# Patient Record
Sex: Male | Born: 1945
Health system: Southern US, Community
[De-identification: ages and names within clinical notes are randomized; demographics above are authoritative.]

## PROBLEM LIST (undated history)

## (undated) DIAGNOSIS — G4730 Sleep apnea, unspecified: Secondary | ICD-10-CM

## (undated) DIAGNOSIS — Z8679 Personal history of other diseases of the circulatory system: Secondary | ICD-10-CM

## (undated) DIAGNOSIS — R945 Abnormal results of liver function studies: Secondary | ICD-10-CM

## (undated) DIAGNOSIS — F32A Depression, unspecified: Secondary | ICD-10-CM

## (undated) DIAGNOSIS — E7800 Pure hypercholesterolemia, unspecified: Secondary | ICD-10-CM

## (undated) DIAGNOSIS — J310 Chronic rhinitis: Secondary | ICD-10-CM

## (undated) DIAGNOSIS — R7989 Other specified abnormal findings of blood chemistry: Secondary | ICD-10-CM

## (undated) DIAGNOSIS — F419 Anxiety disorder, unspecified: Secondary | ICD-10-CM

## (undated) DIAGNOSIS — K5790 Diverticulosis of intestine, part unspecified, without perforation or abscess without bleeding: Secondary | ICD-10-CM

## (undated) DIAGNOSIS — F329 Major depressive disorder, single episode, unspecified: Secondary | ICD-10-CM

## (undated) DIAGNOSIS — D689 Coagulation defect, unspecified: Secondary | ICD-10-CM

## (undated) DIAGNOSIS — C4491 Basal cell carcinoma of skin, unspecified: Secondary | ICD-10-CM

## (undated) HISTORY — PX: COLONOSCOPY: SHX174

## (undated) HISTORY — DX: Other specified abnormal findings of blood chemistry: R79.89

## (undated) HISTORY — DX: Coagulation defect, unspecified: D68.9

## (undated) HISTORY — DX: Chronic rhinitis: J31.0

## (undated) HISTORY — DX: Depression, unspecified: F32.A

## (undated) HISTORY — DX: Anxiety disorder, unspecified: F41.9

## (undated) HISTORY — DX: Abnormal results of liver function studies: R94.5

## (undated) HISTORY — DX: Basal cell carcinoma of skin, unspecified: C44.91

## (undated) HISTORY — DX: Diverticulosis of intestine, part unspecified, without perforation or abscess without bleeding: K57.90

## (undated) HISTORY — DX: Sleep apnea, unspecified: G47.30

## (undated) HISTORY — DX: Pure hypercholesterolemia, unspecified: E78.00

## (undated) HISTORY — DX: Personal history of other diseases of the circulatory system: Z86.79

## (undated) HISTORY — DX: Major depressive disorder, single episode, unspecified: F32.9

---

## 1998-10-16 ENCOUNTER — Emergency Department (HOSPITAL_COMMUNITY): Admission: EM | Admit: 1998-10-16 | Discharge: 1998-10-16 | Payer: Self-pay

## 2003-01-18 ENCOUNTER — Encounter: Payer: Self-pay | Admitting: Emergency Medicine

## 2003-01-18 ENCOUNTER — Emergency Department (HOSPITAL_COMMUNITY): Admission: EM | Admit: 2003-01-18 | Discharge: 2003-01-18 | Payer: Self-pay | Admitting: Emergency Medicine

## 2003-01-18 ENCOUNTER — Encounter: Payer: Self-pay | Admitting: Orthopedic Surgery

## 2003-01-24 ENCOUNTER — Inpatient Hospital Stay (HOSPITAL_COMMUNITY): Admission: RE | Admit: 2003-01-24 | Discharge: 2003-01-26 | Payer: Self-pay | Admitting: Orthopedic Surgery

## 2003-01-24 HISTORY — PX: WRIST SURGERY: SHX841

## 2009-06-24 ENCOUNTER — Encounter: Payer: Self-pay | Admitting: Internal Medicine

## 2009-07-09 ENCOUNTER — Ambulatory Visit: Payer: Self-pay | Admitting: Internal Medicine

## 2009-07-23 ENCOUNTER — Ambulatory Visit: Payer: Self-pay | Admitting: Internal Medicine

## 2011-04-30 NOTE — Op Note (Signed)
NAME:  Jonathan Bridges, Jonathan Bridges                             ACCOUNT NO.:  [id number]   MEDICAL RECORD NO.:  [id number]                   PATIENT TYPE:  OIB   LOCATION:  5006                                 FACILITY:  [facility]   PHYSICIAN:  Dionne Ano. Everlene Other, M.D.         DATE OF BIRTH:  1946-01-21   DATE OF PROCEDURE:  01/24/2003  DATE OF DISCHARGE:                                 OPERATIVE REPORT   PREOPERATIVE DIAGNOSIS:  Comminuted complex intra-articular right distal  radius fracture with bone loss.   POSTOPERATIVE DIAGNOSIS:  Comminuted complex intra-articular right distal  radius fracture with bone loss.   PROCEDURE:  1. Open reduction and internal fixation, right distal radius fracture with     DVR plate from [employer] and K-wire fixation.  2. Synthetic bone grafting from [facility] in the form of an MIG bone     graft placed dorsally through a separate incision.  3. Extensor pollicis longus tendon sheath decompression and incision.  4. Posterior interosseous nerve neurectomy.  5. Stress radiography.   SURGEON:  Dionne Ano. Amanda Pea, M.D.   ASSISTANT:  Karie Chimera, P.A.-C.   COMPLICATIONS:  None.   ANESTHESIA:  General.   TOURNIQUET TIME:  Less than 90 minutes.   ANESTHESIA:  General with preoperative axillary block placed.   DRAINS:  One.   ESTIMATED BLOOD LOSS:  Minimal.   INDICATIONS FOR PROCEDURE:  This patient is a 65 year old male with the  above mentioned diagnosis. I have counseled him with regards to the risks  and benefits of the surgery including the risks of infection, bleeding,  anesthesia, damage to normal structures and failure of surgery to accomplish  its intended goals of relieving symptoms and restoring function. The patient  does have sensation intact preoperatively. He has a slight bit of numbness  at the tip of his thumb, but nothing that has advanced or shows signs of an  evolving and aggressive carpal tunnel syndrome. He has underwent  provisional  reduction in the emergency room, however, his progressive interval collapse  and intraarticular displacement necessitates surgical intervention. He  understands all risks and benefits, including possible dystrophy and other  sequelae including malunion, nonunion, need for hardware removal, etc.   DESCRIPTION OF PROCEDURE:  The patient was seen by myself and anesthesia. He  was  taken to the operative suite. A preoperative axillary block was found  to be incomplete and he underwent a general anesthetic as well under the  direction of Dr. Laverle Hobby.  Following this he was laid supine and  appropriately padded and then prepped and draped in the usual sterile  fashion. Preoperative antibiotics were given.   Once a sterile field was secured the patient had finger taps placed about  the wrist. Fluoroscopy was brought in and the fracture site was identified.  I felt that this fracture would be amenable to plate fixation volarly with  the DVR plate  and thus made an approach about the volar radial aspect of the  wrist under 250 mm of tourniquet control.   The patient had this performed to my satisfaction without difficulty. I used  an FCR splitting approach. I retracted the FCR and the carpal canal contents  ulnarly. Following this the pronator quadratus was elevated from radial to  ulnar with an L-shaped incision. This accessed the fracture site.   I then placed the [facility] elevator and combination curet into the fracture  site and reduced the fracture to anatomic position. Following this I applied  a DVR plate. The patient had this applied in the standard AO technique. A  Kirchner wire was placed of the 0.062 variety off the radial styloid for  additional fixation, as the radial styloid piece was very large, and I felt  this would provide extra stability for the first 4 weeks.   This wire was placed and clipped below the skin. I placed all the screws in  the plate  appropriately and made sure that the screws did not encroach the  joint. I took multiple x-rays and performed the intraoperative stress  radiography to ensure this. The patient had excellent alignment, recreation  of his jointline to my satisfaction and good stability.   Once the plate was placed we then irrigated the wound copiously, made a  dorsal incision and performed an EPL tendon sheath incision. This was done  to my satisfaction without difficulty. This decompressed a large amount of  hematoma, and following this a dorsal V-defect was accessed and a synthetic  calcium sulfate bone graft from [facility] (MIG) was placed. This was  done by mixing the prepackaged mixture and then placing this under direct  fluoroscopy into the dorsal V-defect. This was placed to my satisfaction  without difficulty and the patient tolerated this well. There was no  extrusion into the soft tissues.   Following this I performed a posterior interosseous nerve neurectomy; 3 cm  was removed. I head cauterized and crushed the end and allowed it to retract  well proximally to prevent postoperative pain. The patient tolerated this  well without difficulty.   Once this was done the tourniquet was deflated. Hemostasis was obtained.  Copious irrigation was applied. The pronator quadratus was repaired with 3-0  Vicryl. The skin and subcu were repaired with 3-0 Vicryl and Prolene. A #7  PLS drain was placed to allow for egress of fluid. The patient had the  dorsal incision closed with simple Prolene suture.   All compartments were soft. He had excellent refill and a good pulse.  Following this he was  placed in a sterile dressing and a short arm splint.  We did perform stress videography at the conclusion of the case which  demonstrated excellent stability of the fracture site, and I do think he  will be a candidate for early range of motion.  It has been a pleasure to participate in  his care. We will  monitor him  closely. We will plan for an occupational therapy consult, elevation, IV  antibiotics, pain control according to his needs, etc. All questions have  been encouraged and answered.                                               Dionne Ano. Everlene Other, M.D.    [facility]  D:  01/24/2003  T:  01/24/2003  Job:  [id number]

## 2012-02-29 ENCOUNTER — Ambulatory Visit (INDEPENDENT_AMBULATORY_CARE_PROVIDER_SITE_OTHER): Payer: BC Managed Care – PPO | Admitting: Family Medicine

## 2012-02-29 VITALS — BP 130/75 | HR 86 | Temp 98.5°F | Resp 16 | Ht 71.0 in | Wt 214.8 lb

## 2012-02-29 DIAGNOSIS — J0100 Acute maxillary sinusitis, unspecified: Secondary | ICD-10-CM

## 2012-02-29 DIAGNOSIS — J329 Chronic sinusitis, unspecified: Secondary | ICD-10-CM

## 2012-02-29 DIAGNOSIS — J209 Acute bronchitis, unspecified: Secondary | ICD-10-CM

## 2012-02-29 DIAGNOSIS — E785 Hyperlipidemia, unspecified: Secondary | ICD-10-CM

## 2012-02-29 DIAGNOSIS — J40 Bronchitis, not specified as acute or chronic: Secondary | ICD-10-CM

## 2012-02-29 MED ORDER — HYDROCODONE-HOMATROPINE 5-1.5 MG/5ML PO SYRP: 5.0000 mL | ORAL_SOLUTION | Freq: Three times a day (TID) | ORAL | Status: AC | PRN

## 2012-02-29 MED ORDER — LEVOFLOXACIN 500 MG PO TABS: 500.0000 mg | ORAL_TABLET | Freq: Every day | ORAL | Status: AC

## 2012-02-29 NOTE — Patient Instructions (Signed)
Bronchitis  Bronchitis is the body's way of reacting to injury and/or infection (inflammation) of the bronchi. Bronchi are the air tubes that extend from the windpipe into the lungs. If the inflammation becomes severe, it may cause shortness of breath.  CAUSES   Inflammation may be caused by:   A virus.   Germs (bacteria).   Dust.   Allergens.   Pollutants and many other irritants.  The cells lining the bronchial tree are covered with tiny hairs (cilia). These constantly beat upward, away from the lungs, toward the mouth. This keeps the lungs free of pollutants. When these cells become too irritated and are unable to do their job, mucus begins to develop. This causes the characteristic cough of bronchitis. The cough clears the lungs when the cilia are unable to do their job. Without either of these protective mechanisms, the mucus would settle in the lungs. Then you would develop pneumonia.  Smoking is a common cause of bronchitis and can contribute to pneumonia. Stopping this habit is the single most important thing you can do to help yourself.  TREATMENT    Your caregiver may prescribe an antibiotic if the cough is caused by bacteria. Also, medicines that open up your airways make it easier to breathe. Your caregiver may also recommend or prescribe an expectorant. It will loosen the mucus to be coughed up. Only take over-the-counter or prescription medicines for pain, discomfort, or fever as directed by your caregiver.   Removing whatever causes the problem (smoking, for example) is critical to preventing the problem from getting worse.   Cough suppressants may be prescribed for relief of cough symptoms.   Inhaled medicines may be prescribed to help with symptoms now and to help prevent problems from returning.   For those with recurrent (chronic) bronchitis, there may be a need for steroid medicines.  SEEK IMMEDIATE MEDICAL CARE IF:    During treatment, you develop more pus-like mucus (purulent  sputum).   You have a fever.   Your baby is older than 3 months with a rectal temperature of 102 F (38.9 C) or higher.   Your baby is 3 months old or younger with a rectal temperature of 100.4 F (38 C) or higher.   You become progressively more ill.   You have increased difficulty breathing, wheezing, or shortness of breath.  It is necessary to seek immediate medical care if you are elderly or sick from any other disease.  MAKE SURE YOU:    Understand these instructions.   Will watch your condition.   Will get help right away if you are not doing well or get worse.  Document Released: 11/29/2005 Document Revised: 11/18/2011 Document Reviewed: 10/08/2008  Exit[employer] Patient Information 2012 [employer]  Sinusitis  Sinuses are air pockets within the bones of your face. The growth of bacteria within a sinus leads to infection. The infection prevents the sinuses from draining. This infection is called sinusitis.  SYMPTOMS   There will be different areas of pain depending on which sinuses have become infected.   The maxillary sinuses often produce pain beneath the eyes.   Frontal sinusitis may cause pain in the middle of the forehead and above the eyes.  Other problems (symptoms) include:   Toothaches.   Colored, pus-like (purulent) drainage from the nose.   Swelling, warmth, and tenderness over the sinus areas may be signs of infection.  TREATMENT   Sinusitis is most often determined by an exam.X-rays may be taken. If x-rays have   been taken, make sure you obtain your results or find out how you are to obtain them. Your caregiver may give you medications (antibiotics). These are medications that will help kill the bacteria causing the infection. You may also be given a medication (decongestant) that helps to reduce sinus swelling.   HOME CARE INSTRUCTIONS    Only take over-the-counter or prescription medicines for pain, discomfort, or fever as directed by your caregiver.   Drink extra fluids. Fluids  help thin the mucus so your sinuses can drain more easily.   Applying either moist heat or ice packs to the sinus areas may help relieve discomfort.   Use saline nasal sprays to help moisten your sinuses. The sprays can be found at your local drugstore.  SEEK IMMEDIATE MEDICAL CARE IF:   You have a fever.   You have increasing pain, severe headaches, or toothache.   You have nausea, vomiting, or drowsiness.   You develop unusual swelling around the face or trouble seeing.  MAKE SURE YOU:    Understand these instructions.   Will watch your condition.   Will get help right away if you are not doing well or get worse.  Document Released: 11/29/2005 Document Revised: 11/18/2011 Document Reviewed: 06/28/2007  [employer] Patient Information 2012 [employer].

## 2012-02-29 NOTE — Progress Notes (Signed)
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  Cough is only occasionally productive.  Fevers have resolved.  No epistaxis.  O:  NAD TM's normal Nose mucopurulent discharge Oroph:  Clear Chest:  Few ronchi.

## 2012-08-09 ENCOUNTER — Ambulatory Visit (INDEPENDENT_AMBULATORY_CARE_PROVIDER_SITE_OTHER): Payer: BC Managed Care – PPO | Admitting: Family Medicine

## 2012-08-09 ENCOUNTER — Encounter: Payer: Self-pay | Admitting: Family Medicine

## 2012-08-09 VITALS — BP 137/81 | HR 77 | Temp 97.9°F | Resp 16 | Ht 70.75 in | Wt 212.0 lb

## 2012-08-09 DIAGNOSIS — E7800 Pure hypercholesterolemia, unspecified: Secondary | ICD-10-CM

## 2012-08-09 DIAGNOSIS — N529 Male erectile dysfunction, unspecified: Secondary | ICD-10-CM

## 2012-08-09 DIAGNOSIS — F419 Anxiety disorder, unspecified: Secondary | ICD-10-CM | POA: Insufficient documentation

## 2012-08-09 DIAGNOSIS — J310 Chronic rhinitis: Secondary | ICD-10-CM | POA: Insufficient documentation

## 2012-08-09 DIAGNOSIS — Z0000 Encounter for general adult medical examination without abnormal findings: Secondary | ICD-10-CM

## 2012-08-09 DIAGNOSIS — F329 Major depressive disorder, single episode, unspecified: Secondary | ICD-10-CM | POA: Insufficient documentation

## 2012-08-09 DIAGNOSIS — G4730 Sleep apnea, unspecified: Secondary | ICD-10-CM | POA: Insufficient documentation

## 2012-08-09 DIAGNOSIS — F32A Depression, unspecified: Secondary | ICD-10-CM | POA: Insufficient documentation

## 2012-08-09 LAB — COMPREHENSIVE METABOLIC PANEL
ALT: 29 U/L (ref 0–53)
AST: 32 U/L (ref 0–37)
CO2: 29 mEq/L (ref 19–32)
Calcium: 9.8 mg/dL (ref 8.4–10.5)
Chloride: 103 mEq/L (ref 96–112)
Potassium: 4.6 mEq/L (ref 3.5–5.3)
Sodium: 141 mEq/L (ref 135–145)
Total Protein: 7.3 g/dL (ref 6.0–8.3)

## 2012-08-09 LAB — PSA: PSA: 0.6 ng/mL (ref <=4.00)

## 2012-08-09 LAB — IFOBT (OCCULT BLOOD): IFOBT: NEGATIVE

## 2012-08-09 LAB — LIPID PANEL: Cholesterol: 174 mg/dL (ref 0–200)

## 2012-08-09 MED ORDER — TADALAFIL 5 MG PO TABS: ORAL_TABLET | ORAL | Status: DC

## 2012-08-09 MED ORDER — SIMVASTATIN 40 MG PO TABS: 40.0000 mg | ORAL_TABLET | Freq: Every evening | ORAL | Status: DC

## 2012-08-09 NOTE — Progress Notes (Signed)
Subjective:    Patient ID: Jonathan Bridges, male    DOB: June 08, 1946, 66 y.o.   MRN: [id number]  HPI  This 66 y.o. Cauc male is here for CPE. He enjoys good health and he takes Simvastatin for  elevated cholesterol.He has a hx of HTN but has been off meds since 2009. He was diagnosed with  Basal Cell Carcinoma in 2011. He is a nonsmoker and nondrinker; he exercises 2x/ week for 45  minutes. 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 He does have some mild E.D., onset within last 6 months. He would like to  "have a little help" with maintaining a firm erection.   Colonoscopy:8/11/ 2010 (normal)- Dr. Yancey Flemings      Review of Systems  Psychiatric/Behavioral:       Pt has hx of Depression/ anxiety treated several years ago; pt is now off prescription medications     Otherwise noncontributory.     Objective:   Physical Exam  Nursing note and vitals reviewed. Constitutional: He is oriented to person, place, and time. He appears well-developed and well-nourished. No distress.  HENT:  Head: Normocephalic and atraumatic.  Right Ear: Hearing, tympanic membrane, external ear and ear canal normal.  Left Ear: Hearing, tympanic membrane, external ear and ear canal normal.  Nose: Nose normal. No nasal deformity or septal deviation.  Mouth/Throat: Uvula is midline, oropharynx is clear and moist and mucous membranes are normal. No oral lesions. No dental caries.  Eyes: Conjunctivae and EOM are normal. Pupils are equal, round, and reactive to light. No scleral icterus.       Wears corrective lenses  Neck: Normal range of motion. Neck supple. No thyromegaly present.  Cardiovascular: Normal rate, regular rhythm, normal heart sounds and intact distal pulses.  Exam reveals no gallop and no friction rub.   No murmur heard. Pulmonary/Chest: Effort normal and breath sounds normal. No respiratory distress. He has no wheezes. He  exhibits no tenderness.  Abdominal: Soft. Bowel sounds are normal. He exhibits no mass. There is no hepatosplenomegaly. There is no tenderness. There is no guarding and no CVA tenderness. No hernia.  Genitourinary: Rectum normal, prostate normal, testes normal and penis normal. Rectal exam shows no external hemorrhoid, no fissure, no mass, no tenderness and anal tone normal. Guaiac negative stool. Right testis shows no mass, no swelling and no tenderness. Left testis shows no mass, no swelling and no tenderness.  Musculoskeletal: Normal range of motion. He exhibits no edema and no tenderness.  Lymphadenopathy:    He has no cervical adenopathy.       Right: No inguinal adenopathy present.       Left: No inguinal adenopathy present.  Neurological: He is alert and oriented to person, place, and time. He has normal reflexes. No cranial nerve deficit. He exhibits normal muscle tone. Coordination normal.  Skin: Skin is warm and dry. No rash noted. No erythema. No pallor.  Psychiatric: He has a normal mood and affect. His behavior is normal. Judgment and thought content normal.    ECG: NSR; no ectopy or ST-TW changes    Results for orders placed in visit on 08/09/12  COMPREHENSIVE METABOLIC PANEL      Component Value Range   Sodium 141  135 - 145 mEq/L   Potassium 4.6  3.5 - 5.3 mEq/L   Chloride 103  96 - 112 mEq/L  CO2 29  19 - 32 mEq/L   Glucose, Bld 92  70 - 99 mg/dL   BUN 12  6 - 23 mg/dL   Creat 4.54  0.98 - 1.19 mg/dL   Total Bilirubin 0.7  0.3 - 1.2 mg/dL   Alkaline Phosphatase 71  39 - 117 U/L   AST 32  0 - 37 U/L   ALT 29  0 - 53 U/L   Total Protein 7.3  6.0 - 8.3 g/dL   Albumin 4.7  3.5 - 5.2 g/dL   Calcium 9.8  8.4 - 14.7 mg/dL  LIPID PANEL      Component Value Range   Cholesterol 174  0 - 200 mg/dL   Triglycerides 829  <562 mg/dL   HDL 46  >13 mg/dL   Total CHOL/HDL Ratio 3.8     VLDL 21  0 - 40 mg/dL   LDL Cholesterol 086 (*) 0 - 99 mg/dL  PSA      Component Value  Range   PSA 0.60  <=4.00 ng/mL  IFOBT (OCCULT BLOOD)      Component Value Range   IFOBT Negative           Assessment & Plan:   1. Routine general medical examination at a health care facility  EKG 12-Lead, Comprehensive metabolic panel, PSA, IFOBT POC (occult bld, rslt in office)  2. Hypercholesterolemia  simvastatin (ZOCOR) 40 MG tablet, Comprehensive metabolic panel, Lipid panel  3. ED (erectile dysfunction)  RX: Cialis generic 5 mg  1 tablet daily Contact clinic with problems or questions If Cialis 5 mg dose not adequate (and no adverse effects), then try 2- 5 mg tablets= 10mg  1-4 hours prior to intercourse (emphasized that medication is not to betaken more than once daily)

## 2012-08-09 NOTE — Progress Notes (Signed)
Quick Note:  Please notify pt that results are normal.   Provide pt with copy of labs. ______ 

## 2012-08-09 NOTE — Patient Instructions (Addendum)

## 2012-08-10 ENCOUNTER — Telehealth: Payer: Self-pay

## 2012-08-10 NOTE — Telephone Encounter (Signed)
Completed prior auth over the phone for pt's Cialis Rx. It was denied - his ins does not cover this for his Dx of ED. Notified pt and advised him he can get a coupon for free 30 day trial and also suggested he should call around for best price at pharmacies. Pt thanked [place].

## 2012-08-30 ENCOUNTER — Telehealth: Payer: Self-pay

## 2012-08-30 NOTE — Telephone Encounter (Signed)
Received prior auth request for pt's cialis. Sent back form w/note that the prior auth was denied and pt aware. Pt has coupon for free 30 day trial.

## 2012-09-27 ENCOUNTER — Other Ambulatory Visit: Payer: Self-pay | Admitting: Radiology

## 2012-09-27 DIAGNOSIS — E7800 Pure hypercholesterolemia, unspecified: Secondary | ICD-10-CM

## 2012-09-27 MED ORDER — SIMVASTATIN 40 MG PO TABS: 40.0000 mg | ORAL_TABLET | Freq: Every evening | ORAL | Status: DC

## 2013-03-11 ENCOUNTER — Ambulatory Visit: Payer: BC Managed Care – PPO | Admitting: Physician Assistant

## 2013-03-11 VITALS — BP 160/90 | HR 84 | Temp 98.0°F | Resp 16 | Ht 70.5 in | Wt 220.0 lb

## 2013-03-11 DIAGNOSIS — H109 Unspecified conjunctivitis: Secondary | ICD-10-CM

## 2013-03-11 DIAGNOSIS — H1045 Other chronic allergic conjunctivitis: Secondary | ICD-10-CM

## 2013-03-11 DIAGNOSIS — H1013 Acute atopic conjunctivitis, bilateral: Secondary | ICD-10-CM

## 2013-03-11 MED ORDER — POLYMYXIN B-TRIMETHOPRIM 10000-0.1 UNIT/ML-% OP SOLN: 1.0000 [drp] | OPHTHALMIC | Status: DC

## 2013-03-11 MED ORDER — AZELASTINE HCL 0.05 % OP SOLN: 1.0000 [drp] | Freq: Two times a day (BID) | OPHTHALMIC | Status: DC

## 2013-03-11 NOTE — Progress Notes (Signed)
  Subjective:    Patient ID: Jonathan Bridges, male    DOB: 1946-08-24, 67 y.o.   MRN: [id number]  HPI 67 year old male presents with 2 week history of bilateral eye drainage that has been clear and watery with some crusting.  Does admit that he has a history of eye irritation that is chronic in nature. No history of environmental allergies. Admits eye drainage has worsened with recent "cold" that does seem to be improving. Has had nasal congestion and rhinorrhea. No fevers, chills, vision changes, headache, otalgia, dizziness, cough, nausea, or vomiting. He otherwise feels well and has no other concerns today.  He is not a contact wearer - has reading and driving glasses only.     Review of Systems  Constitutional: Negative for fever and chills.  HENT: Positive for congestion and rhinorrhea. Negative for ear pain, sore throat and postnasal drip.   Eyes: Positive for discharge, redness and itching. Negative for photophobia, pain and visual disturbance.  Respiratory: Negative for cough and shortness of breath.   Gastrointestinal: Negative for nausea, vomiting and abdominal pain.  Neurological: Negative for dizziness and headaches.       Objective:   Physical Exam  Constitutional: He is oriented to person, place, and time. He appears well-developed and well-nourished.  HENT:  Head: Normocephalic and atraumatic.  Right Ear: Hearing, tympanic membrane, external ear and ear canal normal.  Left Ear: Hearing, tympanic membrane, external ear and ear canal normal.  Mouth/Throat: Uvula is midline, oropharynx is clear and moist and mucous membranes are normal. No oropharyngeal exudate.  Eyes: EOM are normal. Pupils are equal, round, and reactive to light. Right eye exhibits discharge. Right eye exhibits no hordeolum. Left eye exhibits discharge. Left eye exhibits no hordeolum. Right conjunctiva is injected. Left conjunctiva is injected.  Neck: Normal range of motion.  Cardiovascular: Normal rate, regular  rhythm and normal heart sounds.   Pulmonary/Chest: Effort normal and breath sounds normal.  Lymphadenopathy:    He has no cervical adenopathy.  Neurological: He is alert and oriented to person, place, and time.  Psychiatric: He has a normal mood and affect. His behavior is normal. Judgment and thought content normal.          Assessment & Plan:  Conjunctivitis - Plan: trimethoprim-polymyxin b (POLYTRIM) ophthalmic solution  Allergic conjunctivitis, bilateral - Plan: azelastine (OPTIVAR) 0.05 % ophthalmic solution  Polytrim q4hours x 5 days If symptoms persist, trial of Optivar twice daily RTC if symptoms worsen. If no improvement after treatment, may need to follow up with ophthalmology

## 2013-09-19 ENCOUNTER — Other Ambulatory Visit: Payer: Self-pay | Admitting: Family Medicine

## 2013-09-19 ENCOUNTER — Encounter: Payer: Self-pay | Admitting: Family Medicine

## 2013-09-19 ENCOUNTER — Ambulatory Visit (INDEPENDENT_AMBULATORY_CARE_PROVIDER_SITE_OTHER): Payer: Medicare Other | Admitting: Family Medicine

## 2013-09-19 VITALS — BP 141/76 | HR 82 | Temp 98.2°F | Resp 16 | Ht 71.0 in | Wt 215.0 lb

## 2013-09-19 DIAGNOSIS — E7800 Pure hypercholesterolemia, unspecified: Secondary | ICD-10-CM

## 2013-09-19 DIAGNOSIS — Z23 Encounter for immunization: Secondary | ICD-10-CM

## 2013-09-19 DIAGNOSIS — Z139 Encounter for screening, unspecified: Secondary | ICD-10-CM

## 2013-09-19 DIAGNOSIS — N529 Male erectile dysfunction, unspecified: Secondary | ICD-10-CM

## 2013-09-19 DIAGNOSIS — Z0000 Encounter for general adult medical examination without abnormal findings: Secondary | ICD-10-CM

## 2013-09-19 DIAGNOSIS — R600 Localized edema: Secondary | ICD-10-CM

## 2013-09-19 DIAGNOSIS — R609 Edema, unspecified: Secondary | ICD-10-CM

## 2013-09-19 LAB — CBC WITH DIFFERENTIAL/PLATELET
Basophils Absolute: 0 10*3/uL (ref 0.0–0.1)
Basophils Relative: 0 % (ref 0–1)
Eosinophils Absolute: 0 10*3/uL (ref 0.0–0.7)
Eosinophils Relative: 1 % (ref 0–5)
HCT: 45.7 % (ref 39.0–52.0)
Hemoglobin: 16.2 g/dL (ref 13.0–17.0)
MCH: 32 pg (ref 26.0–34.0)
MCHC: 35.4 g/dL (ref 30.0–36.0)
MCV: 90.3 fL (ref 78.0–100.0)
Monocytes Absolute: 0.4 10*3/uL (ref 0.1–1.0)
Monocytes Relative: 7 % (ref 3–12)

## 2013-09-19 LAB — COMPREHENSIVE METABOLIC PANEL
ALT: 35 U/L (ref 0–53)
CO2: 32 mEq/L (ref 19–32)
Chloride: 101 mEq/L (ref 96–112)
Creat: 1.22 mg/dL (ref 0.50–1.35)
Glucose, Bld: 102 mg/dL — ABNORMAL HIGH (ref 70–99)
Total Bilirubin: 0.6 mg/dL (ref 0.3–1.2)

## 2013-09-19 LAB — POCT URINALYSIS DIPSTICK
Bilirubin, UA: NEGATIVE
Glucose, UA: NEGATIVE
Protein, UA: NEGATIVE
Spec Grav, UA: 1.02
Urobilinogen, UA: 0.2

## 2013-09-19 LAB — LIPID PANEL
Cholesterol: 204 mg/dL — ABNORMAL HIGH (ref 0–200)
HDL: 48 mg/dL (ref >39)
Total CHOL/HDL Ratio: 4.3 Ratio
Triglycerides: 101 mg/dL (ref <150)
VLDL: 20 mg/dL (ref 0–40)

## 2013-09-19 LAB — IFOBT (OCCULT BLOOD): IFOBT: NEGATIVE

## 2013-09-19 MED ORDER — TADALAFIL 5 MG PO TABS: ORAL_TABLET | ORAL | Status: DC

## 2013-09-19 NOTE — Patient Instructions (Signed)

## 2013-09-19 NOTE — Progress Notes (Signed)
Subjective:    Patient ID: Jonathan Bridges, male    DOB: 1946-08-09, 67 y.o.   MRN: [id number]  HPI  This 67 y.o. Cauc male is here today for Welcome to [facility] physical. He feels well but his wife is concerned about intermittent ankle edema. This occurs usually after prolonged sitting or w/ traveling and change in diet (lots of eating out at restaurants). Pt has concerns about ED medication; he had back pain and flushing w/ Cialis, which he was taking daily. He gets an erection but unable to maintain it.  HCM: Vision- annually.           CRS- 2011 (normal).           IMM- current.   Patient Active Problem List   Diagnosis Date Noted  . ED (erectile dysfunction) 08/09/2012  . Sleep apnea   . Hypercholesterolemia   . Depression   . Anxiety   . Chronic rhinitis   . Hyperlipidemia 02/29/2012   PMHx, Soc Hx and Fam Hx reviewed.   Review of Systems  Constitutional:       Able to perform ADLs; has no hearing impairment.  Genitourinary:       Erectile dysfunction.  Neurological: Positive for dizziness. Negative for syncope, speech difficulty, weakness, numbness and headaches.       Infrequent dizziness upon standing to fast.  Psychiatric/Behavioral:       HANDS Questionnaire for Depression Screening= 0  All other systems reviewed and are negative.       Objective:   Physical Exam  Nursing note and vitals reviewed. Constitutional: He is oriented to person, place, and time. Vital signs are normal. He appears well-developed and well-nourished. No distress.  Eyes: Conjunctivae, EOM and lids are normal. Pupils are equal, round, and reactive to light. No scleral icterus.  Annual eye care w/ specialist.  Neck: Normal range of motion and full passive range of motion without pain. Neck supple. No JVD present. No spinous process tenderness and no muscular tenderness present. Carotid bruit is not present. No mass and no thyromegaly present.  Cardiovascular: Normal rate, regular rhythm, S1  normal, S2 normal, normal heart sounds, intact distal pulses and normal pulses.   No extrasystoles are present. PMI is not displaced.  Exam reveals no gallop and no friction rub.   No murmur heard. Pulmonary/Chest: Effort normal and breath sounds normal. No respiratory distress.  Abdominal: Soft. Normal appearance and bowel sounds are normal. He exhibits no distension, no abdominal bruit, no pulsatile midline mass and no mass. There is no hepatosplenomegaly. There is no tenderness. There is no guarding and no CVA tenderness. No hernia.  Genitourinary: Rectum normal. Rectal exam shows no external hemorrhoid, no fissure, no mass, no tenderness and anal tone normal. Guaiac negative stool. Prostate is not enlarged and not tender.  Musculoskeletal: Normal range of motion. He exhibits no edema and no tenderness.  Lymphadenopathy:       Head (right side): No submental, no submandibular, no tonsillar, no posterior auricular and no occipital adenopathy present.       Head (left side): No submental, no submandibular, no tonsillar, no posterior auricular and no occipital adenopathy present.    He has no cervical adenopathy.       Right: No inguinal and no supraclavicular adenopathy present.       Left: No inguinal and no supraclavicular adenopathy present.  Neurological: He is alert and oriented to person, place, and time. He has normal strength. He is not disoriented.  He displays no atrophy. No cranial nerve deficit or sensory deficit. He exhibits normal muscle tone. Coordination and gait normal.  Reflex Scores:      Tricep reflexes are 1+ on the right side and 1+ on the left side.      Bicep reflexes are 1+ on the right side and 1+ on the left side.      Patellar reflexes are 2+ on the right side and 2+ on the left side. Skin: Skin is warm, dry and intact. No ecchymosis, no lesion and no rash noted. He is not diaphoretic. No cyanosis or erythema. No pallor. Nails show no clubbing.  Psychiatric: He has a  normal mood and affect. His speech is normal and behavior is normal. Judgment and thought content normal. His mood appears not anxious. His affect is not inappropriate. Cognition and memory are normal. He does not exhibit a depressed mood.     Results for orders placed in visit on 09/19/13  POCT URINALYSIS DIPSTICK      Result Value Range   Color, UA yellow     Clarity, UA clear     Glucose, UA neg     Bilirubin, UA neg     Ketones, UA neg     Spec Grav, UA 1.020     Blood, UA trace     pH, UA 6.5     Protein, UA neg     Urobilinogen, UA 0.2     Nitrite, UA neg     Leukocytes, UA Negative    IFOBT (OCCULT BLOOD)      Result Value Range   IFOBT Negative         Assessment & Plan:  Routine general medical examination at a health care facility - Plan: POCT urinalysis dipstick, IFOBT POC (occult bld, rslt in office), Comprehensive metabolic panel, Lipid panel, PSA, Medicare, TSH, T3, free, CBC with Differential  ED (erectile dysfunction)- Pt prefers to stay w/ Cialis but will use it only prn. Offered switch to Levitra; he will contact clinic if her has ongoing problems w/ Cialis.  Pure hypercholesterolemia - Plan: IFOBT POC (occult bld, rslt in office), Comprehensive metabolic panel, Lipid panel, PSA, Medicare, TSH, T3, free  Lower extremity edema - Suspect dependent edema w/ mild venous insufficiency. Suggest compression hose (knee high).     Plan: IFOBT POC (occult bld, rslt in office), Comprehensive metabolic panel, Lipid panel, PSA, Medicare, TSH, T3, free  Need for prophylactic vaccination and inoculation against influenza - Plan: Flu Vaccine QUAD 36+ mos IM  Meds ordered this encounter  Medications  . tadalafil (CIALIS) 5 MG tablet    Sig: Take 1 tablet about 1 hour before activity.    Dispense:  10 tablet    Refill:  3

## 2013-09-20 ENCOUNTER — Other Ambulatory Visit: Payer: Self-pay | Admitting: Family Medicine

## 2013-09-20 NOTE — Progress Notes (Signed)
Quick Note:  Please advise pt regarding following labs... All your labs look great except Total and LDL ("bad") cholesterol are slightly elevated. Your HDL ("good") cholesterol needs to be higher. Based on your lipid profile, you have an average risk of heart disease. These numbers are increased compared to last year. Continue taking current dose of Simvastatin 40 mg daily. The Mediterranean Diet is a great guide for healthier eating to lower total and LDL cholesterol; staying physically active helps raise the HDL ("good) cholesterol. Also consider Fish Oil capsule (Schiff MegaRed Krill Oil is potent); take 1 capsule daily, separate from other solid pills and capsules. These numbers can be rechecked in 6 months or so.  Prostate blood test is normal.  Copy to pt. ______

## 2014-05-01 ENCOUNTER — Other Ambulatory Visit: Payer: Self-pay | Admitting: Physician Assistant

## 2014-05-01 ENCOUNTER — Other Ambulatory Visit: Payer: Self-pay | Admitting: Emergency Medicine

## 2014-05-01 NOTE — Telephone Encounter (Signed)
Left message on VM advising patient that refill sent, and that he is due for re-evaluation and fasting labs for additional refills.

## 2014-08-24 ENCOUNTER — Other Ambulatory Visit: Payer: Self-pay | Admitting: Physician Assistant

## 2014-08-30 ENCOUNTER — Encounter: Payer: Medicare Other | Admitting: Family Medicine

## 2014-09-20 ENCOUNTER — Ambulatory Visit (INDEPENDENT_AMBULATORY_CARE_PROVIDER_SITE_OTHER): Payer: Medicare Other | Admitting: Family Medicine

## 2014-09-20 ENCOUNTER — Encounter: Payer: Self-pay | Admitting: Family Medicine

## 2014-09-20 VITALS — BP 140/82 | HR 77 | Temp 97.9°F | Resp 16 | Ht 70.5 in | Wt 219.4 lb

## 2014-09-20 DIAGNOSIS — Z23 Encounter for immunization: Secondary | ICD-10-CM

## 2014-09-20 DIAGNOSIS — M25511 Pain in right shoulder: Secondary | ICD-10-CM | POA: Diagnosis not present

## 2014-09-20 DIAGNOSIS — E7800 Pure hypercholesterolemia, unspecified: Secondary | ICD-10-CM

## 2014-09-20 DIAGNOSIS — Z1211 Encounter for screening for malignant neoplasm of colon: Secondary | ICD-10-CM | POA: Diagnosis not present

## 2014-09-20 DIAGNOSIS — Z0000 Encounter for general adult medical examination without abnormal findings: Secondary | ICD-10-CM

## 2014-09-20 LAB — LIPID PANEL
CHOL/HDL RATIO: 3.4 ratio
Cholesterol: 162 mg/dL (ref 0–200)
HDL: 48 mg/dL (ref >39)
LDL CALC: 93 mg/dL (ref 0–99)
Triglycerides: 105 mg/dL (ref <150)
VLDL: 21 mg/dL (ref 0–40)

## 2014-09-20 LAB — COMPLETE METABOLIC PANEL WITH GFR
ALT: 27 U/L (ref 0–53)
AST: 30 U/L (ref 0–37)
Albumin: 4.3 g/dL (ref 3.5–5.2)
Alkaline Phosphatase: 74 U/L (ref 39–117)
BUN: 15 mg/dL (ref 6–23)
CO2: 26 mEq/L (ref 19–32)
Calcium: 9.6 mg/dL (ref 8.4–10.5)
Chloride: 102 mEq/L (ref 96–112)
Creat: 1.28 mg/dL (ref 0.50–1.35)
GFR, EST NON AFRICAN AMERICAN: 58 mL/min — AB
GFR, Est African American: 67 mL/min
GLUCOSE: 88 mg/dL (ref 70–99)
Potassium: 4.6 mEq/L (ref 3.5–5.3)
Sodium: 139 mEq/L (ref 135–145)
TOTAL PROTEIN: 7.1 g/dL (ref 6.0–8.3)
Total Bilirubin: 0.7 mg/dL (ref 0.2–1.2)

## 2014-09-20 MED ORDER — SIMVASTATIN 40 MG PO TABS: 40.0000 mg | ORAL_TABLET | Freq: Every day | ORAL | Status: DC

## 2014-09-20 NOTE — Patient Instructions (Signed)
Keeping you healthy  Get these tests  Blood pressure- Have your blood pressure checked once a year by your healthcare provider.  Normal blood pressure is 120/80  Weight- Have your body mass index (BMI) calculated to screen for obesity.  BMI is a measure of body fat based on height and weight. You can also calculate your own BMI at [email or web address].  Cholesterol- Have your cholesterol checked every year.  Diabetes- Have your blood sugar checked regularly if you have high blood pressure, high cholesterol, have a family history of diabetes or if you are overweight.  Screening for Colon Cancer- Colonoscopy starting at age 68.  Screening may begin sooner depending on your family history and other health conditions. Follow up colonoscopy as directed by your Gastroenterologist.  Screening for Prostate Cancer- Both blood work (PSA) and a rectal exam help screen for Prostate Cancer.  Screening begins at age 76 with African-American men and at age 58 with Caucasian men.  Screening may begin sooner depending on your family history.  Take these medicines  Aspirin- One aspirin daily can help prevent Heart disease and Stroke.  Flu shot- Every fall.  Tetanus- Every 10 years.  Zostavax- Once after the age of 92 to prevent Shingles.  Pneumonia shot- Once after the age of 62; if you are younger than 50, ask your healthcare provider if you need a Pneumonia shot.  Take these steps  Don't smoke- If you do smoke, talk to your doctor about quitting.  For tips on how to quit, go to www.smokefree.gov or call 1-800-QUIT-NOW.  Be physically active- Exercise 5 days a week for at least 30 minutes.  If you are not already physically active start slow and gradually work up to 30 minutes of moderate physical activity.  Examples of moderate activity include walking briskly, mowing the yard, dancing, swimming, bicycling, etc.  Eat a healthy diet- Eat a variety of healthy food such as fruits, vegetables, low  fat milk, low fat cheese, yogurt, lean meant, poultry, fish, beans, tofu, etc. For more information go to www.thenutritionsource.org  Drink alcohol in moderation- Limit alcohol intake to less than two drinks a day. Never drink and drive.  Dentist- Brush and floss twice daily; visit your dentist twice a year.  Depression- Your emotional health is as important as your physical health. If you're feeling down, or losing interest in things you would normally enjoy please talk to your healthcare provider.  Eye exam- Visit your eye doctor every year.  Safe sex- If you may be exposed to a sexually transmitted infection, use a condom.  Seat belts- Seat belts can save your life; always wear one.  Smoke/Carbon Monoxide detectors- These detectors need to be installed on the appropriate level of your home.  Replace batteries at least once a year.  Skin cancer- When out in the sun, cover up and use sunscreen 15 SPF or higher.  Violence- If anyone is threatening you, please tell your healthcare provider.  Living Will/ Health care power of attorney- Speak with your healthcare provider and family.   Shoulder Pain The shoulder is the joint that connects your arm to your body. Muscles and band-like tissues that connect bones to muscles (tendons) hold the joint together. Shoulder pain is felt if an injury or medical problem affects one or more parts of the shoulder. HOME CARE   Put ice on the sore area.  Put ice in a plastic bag.  Place a towel between your skin and the bag.  Leave the ice  on for 15-20 minutes, 03-04 times a day for the first 2 days.  Stop using cold packs if they do not help with the pain.  If you were given something to keep your shoulder from moving (sling; shoulder immobilizer), wear it as told. Only take it off to shower or bathe.  Move your arm as little as possible, but keep your hand moving to prevent puffiness (swelling).  Squeeze a soft ball or foam pad as much as  possible to help prevent swelling.  Take medicine as told by your doctor. GET HELP IF:  You have progressing new pain in your arm, hand, or fingers.  Your hand or fingers get cold.  Your medicine does not help lessen your pain. GET HELP RIGHT AWAY IF:   Your arm, hand, or fingers are numb or tingling.  Your arm, hand, or fingers are puffy (swollen), painful, or turn white or blue. MAKE SURE YOU:   Understand these instructions.  Will watch your condition.  Will get help right away if you are not doing well or get worse. Document Released: 05/17/2008 Document Revised: 04/15/2014 Document Reviewed: 06/12/2012 [facility] Patient Information 2015 [place], [place]. This information is not intended to replace advice given to you by your health care provider. Make sure you discuss any questions you have with your health care provider.        Mediterranean Diet  Why follow it? Research shows.   Those who follow the Mediterranean diet have a reduced risk of heart disease    The diet is associated with a reduced incidence of Parkinson's and Alzheimer's diseases   People following the diet may have longer life expectancies and lower rates of chronic diseases    The Dietary Guidelines for Americans recommends the Mediterranean diet as an eating plan to promote health and prevent disease  What Is the Mediterranean Diet?    Healthy eating plan based on typical foods and recipes of Mediterranean-style cooking   The diet is primarily a plant based diet; these foods should make up a majority of meals   Starches - Plant based foods should make up a majority of meals - They are an important sources of vitamins, minerals, energy, antioxidants, and fiber - Choose whole grains, foods high in fiber and minimally processed items  - Typical grain sources include wheat, oats, barley, corn, brown rice, bulgar, farro, millet, polenta, couscous  - Various types of beans include chickpeas, lentils, fava  beans, black beans, white beans   Fruits  Veggies - Large quantities of antioxidant rich fruits & veggies; 6 or more servings  - Vegetables can be eaten raw or lightly drizzled with oil and cooked  - Vegetables common to the traditional Mediterranean Diet include: artichokes, arugula, beets, broccoli, brussel sprouts, cabbage, carrots, celery, collard greens, cucumbers, eggplant, kale, leeks, lemons, lettuce, mushrooms, okra, onions, peas, peppers, potatoes, pumpkin, radishes, rutabaga, shallots, spinach, sweet potatoes, turnips, zucchini - Fruits common to the Mediterranean Diet include: apples, apricots, avocados, cherries, clementines, dates, figs, grapefruits, grapes, melons, nectarines, oranges, peaches, pears, pomegranates, strawberries, tangerines  Fats - Replace butter and margarine with healthy oils, such as olive oil, canola oil, and tahini  - Limit nuts to no more than a handful a day  - Nuts include walnuts, almonds, pecans, pistachios, pine nuts  - Limit or avoid candied, honey roasted or heavily salted nuts - Olives are central to the Mediterranean diet - can be eaten whole or used in a variety of dishes   Meats  Protein - Limiting red meat: no more than a few times a month - When eating red meat: choose lean cuts and keep the portion to the size of deck of cards - Eggs: approx. 0 to 4 times a week  - Fish and lean poultry: at least 2 a week  - Healthy protein sources include, chicken, [place], lean beef, lamb - Increase intake of seafood such as tuna, salmon, trout, mackerel, shrimp, scallops - Avoid or limit high fat processed meats such as sausage and bacon  Dairy - Include moderate amounts of low fat dairy products  - Focus on healthy dairy such as fat free yogurt, skim milk, low or reduced fat cheese - Limit dairy products higher in fat such as whole or 2% milk, cheese, ice cream  Alcohol - Moderate amounts of red wine is ok  - No more than 5 oz daily for women (all ages) and  men older than age 48  - No more than 10 oz of wine daily for men younger than 90  Other - Limit sweets and other desserts  - Use herbs and spices instead of salt to flavor foods  - Herbs and spices common to the traditional Mediterranean Diet include: basil, bay leaves, chives, cloves, cumin, fennel, garlic, lavender, marjoram, mint, oregano, parsley, pepper, rosemary, sage, savory, sumac, tarragon, thyme   It's not just a diet, it's a lifestyle:    The Mediterranean diet includes lifestyle factors typical of those in the region    Foods, drinks and meals are best eaten with others and savored   Daily physical activity is important for overall good health   This could be strenuous exercise like running and aerobics   This could also be more leisurely activities such as walking, housework, yard-work, or taking the stairs   Moderation is the key; a balanced and healthy diet accommodates most foods and drinks   Consider portion sizes and frequency of consumption of certain foods   Meal Ideas & Options:    Breakfast:  o Whole wheat toast or whole wheat English muffins with peanut butter & hard boiled egg o Steel cut oats topped with apples & cinnamon and skim milk  o Fresh fruit: banana, strawberries, melon, berries, peaches  o Smoothies: strawberries, bananas, greek yogurt, peanut butter o Low fat greek yogurt with blueberries and granola  o Egg white omelet with spinach and mushrooms o Breakfast couscous: whole wheat couscous, apricots, skim milk, cranberries    Sandwiches:  o Hummus and grilled vegetables (peppers, zucchini, squash) on whole wheat bread   o Grilled chicken on whole wheat pita with lettuce, tomatoes, cucumbers or tzatziki  o Tuna salad on whole wheat bread: tuna salad made with greek yogurt, olives, red peppers, capers, green onions o Garlic rosemary lamb pita: lamb sauted with garlic, rosemary, salt & pepper; add lettuce, cucumber, greek yogurt to pita - flavor with  lemon juice and black pepper    Seafood:  o Mediterranean grilled salmon, seasoned with garlic, basil, parsley, lemon juice and black pepper o Shrimp, lemon, and spinach whole-grain pasta salad made with low fat greek yogurt  o Seared scallops with lemon orzo  o Seared tuna steaks seasoned salt, pepper, coriander topped with tomato mixture of olives, tomatoes, olive oil, minced garlic, parsley, green onions and cappers    Meats:  o Herbed greek chicken salad with kalamata olives, cucumber, feta  o Red bell peppers stuffed with spinach, bulgur, lean ground beef (or lentils) & topped with  feta   o Kebabs: skewers of chicken, tomatoes, onions, zucchini, squash  o [place] burgers: made with red onions, mint, dill, lemon juice, feta cheese topped with roasted red peppers   Vegetarian o Cucumber salad: cucumbers, artichoke hearts, celery, red onion, feta cheese, tossed in olive oil & lemon juice  o Hummus and whole grain pita points with a greek salad (lettuce, tomato, feta, olives, cucumbers, red onion) o Lentil soup with celery, carrots made with vegetable broth, garlic, salt and pepper  o Tabouli salad: parsley, bulgur, mint, scallions, cucumbers, tomato, radishes, lemon juice, olive oil, salt and pepper. o

## 2014-09-20 NOTE — Progress Notes (Signed)
Subjective:    Patient ID: Jonathan Bridges, male    DOB: 12/01/1946, 68 y.o.   MRN: [id number]  HPI  This 68 y.o. 68 male is here for annual CPE. He works in Personal assistant and feels well. He states he is happy is his life and his marriage.   HCM: CRS- 2011; recall at 10 years.           IMM- Current.           Vision- Every 1-2 years.           Dental- Periodically.    Patient Active Problem List   Diagnosis Date Noted  . ED (erectile dysfunction) 08/09/2012  . Sleep apnea   . Hypercholesterolemia   . Depression   . Anxiety   . Chronic rhinitis   . Hyperlipidemia 02/29/2012    Prior to Admission medications   Medication Sig Start Date End Date Taking? Authorizing Provider  aspirin 81 MG tablet Take 81 mg by mouth daily.   Yes Historical Provider, MD  cholecalciferol (VITAMIN D) 1000 UNITS tablet Take 1,000 Units by mouth daily.   Yes Historical Provider, MD  Multiple Vitamin (MULTIVITAMIN) tablet Take 1 tablet by mouth daily.   Yes Historical Provider, MD  naproxen (NAPROSYN) 250 MG tablet Take by mouth 2 (two) times daily with a meal. Two tablets in the am and two tabs in the evening   Yes Historical Provider, MD  pyridOXINE (VITAMIN B-6) 100 MG tablet Take 100 mg by mouth daily.   Yes Historical Provider, MD  simvastatin (ZOCOR) 40 MG tablet Take 1 tablet (40 mg total) by mouth daily. 09/20/14  Yes Barton Fanny, MD   History   Social History  . Marital Status: Married    Spouse Name: N/A    Number of Children: N/A  . Years of Education: N/A   Occupational History  . realtor    Social History Main Topics  . Smoking status: Never Smoker   . Smokeless tobacco: Not on file  . Alcohol Use: No  . Drug Use: No  . Sexual Activity: Yes   Other Topics Concern  . Not on file   Social History Narrative   Exercise walking          Family History  Problem Relation Age of Onset  . Diabetes Father     Review of Systems  Constitutional: Negative.   HENT:  Negative.   Eyes: Negative.   Respiratory: Negative.   Cardiovascular: Negative.   Gastrointestinal: Negative.   Endocrine: Negative.   Genitourinary: Negative.        Discontinued Cialis due to flushing.  Musculoskeletal: Positive for arthralgias. Negative for myalgias.       [place] [address] in Feb 2015 and has had intermittent shoulder pain, worse at night. Some mid back  Pain. ROM normal.  Skin: Negative.   Allergic/Immunologic: Negative.   Neurological: Negative.   Hematological: Negative.   Psychiatric/Behavioral: Negative.       Objective:   Physical Exam  Nursing note and vitals reviewed. Constitutional: He is oriented to person, place, and time. Vital signs are normal. He appears well-developed and well-nourished. No distress.  HENT:  Head: Normocephalic and atraumatic.  Right Ear: Hearing, tympanic membrane, external ear and ear canal normal.  Left Ear: Hearing, tympanic membrane, external ear and ear canal normal.  Nose: Nose normal. No nasal deformity or septal deviation.  Mouth/Throat: Uvula is midline, oropharynx is clear and moist and mucous membranes are  normal. No oral lesions. No dental caries.  Eyes: Conjunctivae, EOM and lids are normal. Pupils are equal, round, and reactive to light. No scleral icterus.  Wears corrective lenses.  Neck: Trachea normal, normal range of motion, full passive range of motion without pain and phonation normal. Neck supple. No JVD present. No spinous process tenderness and no muscular tenderness present. Carotid bruit is not present. No mass and no thyromegaly present.  Cardiovascular: Normal rate, regular rhythm, S1 normal, normal heart sounds, intact distal pulses and normal pulses.   No extrasystoles are present. Exam reveals no gallop and no friction rub.   No murmur heard. Pulmonary/Chest: Effort normal and breath sounds normal. No respiratory distress. He has no decreased breath sounds. He has no wheezes. He has no rales.  Abdominal: Soft.  Normal appearance and bowel sounds are normal. He exhibits no distension, no abdominal bruit, no pulsatile midline mass and no mass. There is no hepatosplenomegaly. There is no tenderness. There is no guarding and no CVA tenderness. No hernia.  Genitourinary:  Deferred.  Musculoskeletal:       Right shoulder: Normal.       Left shoulder: Normal.       Cervical back: Normal.       Thoracic back: Normal.       Lumbar back: Normal.  Remainder of exam unremarkable.  Lymphadenopathy:       Head (right side): No submental, no submandibular, no tonsillar, no preauricular, no posterior auricular and no occipital adenopathy present.       Head (left side): No submental, no submandibular, no tonsillar, no preauricular, no posterior auricular and no occipital adenopathy present.    He has no cervical adenopathy.    He has no axillary adenopathy.       Right: No inguinal and no supraclavicular adenopathy present.       Left: No inguinal and no supraclavicular adenopathy present.  Neurological: He is alert and oriented to person, place, and time. He has normal strength. He displays no atrophy and no tremor. No cranial nerve deficit or sensory deficit. He exhibits normal muscle tone. He displays a negative Romberg sign. Coordination and gait normal.  Reflex Scores:      Tricep reflexes are 1+ on the right side and 1+ on the left side.      Bicep reflexes are 2+ on the right side and 2+ on the left side.      Brachioradialis reflexes are 2+ on the right side and 2+ on the left side.      Patellar reflexes are 2+ on the right side and 2+ on the left side. Skin: Skin is warm, dry and intact. No ecchymosis and no rash noted. He is not diaphoretic. No cyanosis or erythema. No pallor. Nails show no clubbing.  Psychiatric: He has a normal mood and affect. His speech is normal and behavior is normal. Judgment and thought content normal. Cognition and memory are normal.      Assessment & Plan:  Routine physical  examination  Hypercholesterolemia - Tolerating statin w/o adverse effects. Plan: Lipid panel  Pain in joint, shoulder region, right - Monitor symptoms; not problematic at this time. Continue symptomatic treatment. Plan: PSA, Lipid panel, COMPLETE METABOLIC PANEL WITH GFR  Screening for colon cancer - Plan: Hemoccult - 1 Card (office), Hemoccult - 1 Card (office), Hemoccult - 1 Card (office)   Meds ordered this encounter  Medications  . naproxen (NAPROSYN) 250 MG tablet    Sig: Take by  mouth 2 (two) times daily with a meal. Two tablets in the am and two tabs in the evening  . simvastatin (ZOCOR) 40 MG tablet    Sig: Take 1 tablet (40 mg total) by mouth daily.    Dispense:  90 tablet    Refill:  3

## 2014-09-21 LAB — PSA: PSA: 0.54 ng/mL (ref <=4.00)

## 2014-09-21 NOTE — Progress Notes (Signed)
Quick Note:  Please notify pt that results are normal.   Provide pt with copy of labs. ______ 

## 2014-09-22 ENCOUNTER — Encounter: Payer: Self-pay | Admitting: *Deleted

## 2014-09-23 ENCOUNTER — Encounter: Payer: Self-pay | Admitting: Family Medicine

## 2015-06-23 ENCOUNTER — Encounter: Payer: Self-pay | Admitting: *Deleted

## 2015-09-24 ENCOUNTER — Ambulatory Visit (INDEPENDENT_AMBULATORY_CARE_PROVIDER_SITE_OTHER): Payer: Medicare Other | Admitting: Family Medicine

## 2015-09-24 ENCOUNTER — Encounter: Payer: Self-pay | Admitting: Family Medicine

## 2015-09-24 VITALS — BP 124/70 | HR 70 | Temp 98.4°F | Resp 16 | Ht 70.5 in | Wt 216.6 lb

## 2015-09-24 DIAGNOSIS — Z0000 Encounter for general adult medical examination without abnormal findings: Secondary | ICD-10-CM | POA: Diagnosis not present

## 2015-09-24 DIAGNOSIS — E785 Hyperlipidemia, unspecified: Secondary | ICD-10-CM

## 2015-09-24 DIAGNOSIS — R0982 Postnasal drip: Secondary | ICD-10-CM | POA: Diagnosis not present

## 2015-09-24 DIAGNOSIS — Z125 Encounter for screening for malignant neoplasm of prostate: Secondary | ICD-10-CM

## 2015-09-24 DIAGNOSIS — Z5181 Encounter for therapeutic drug level monitoring: Secondary | ICD-10-CM

## 2015-09-24 DIAGNOSIS — Z119 Encounter for screening for infectious and parasitic diseases, unspecified: Secondary | ICD-10-CM

## 2015-09-24 DIAGNOSIS — Z131 Encounter for screening for diabetes mellitus: Secondary | ICD-10-CM | POA: Diagnosis not present

## 2015-09-24 DIAGNOSIS — Z23 Encounter for immunization: Secondary | ICD-10-CM

## 2015-09-24 LAB — HEPATITIS C ANTIBODY: HCV Ab: NEGATIVE

## 2015-09-24 LAB — COMPREHENSIVE METABOLIC PANEL
ALBUMIN: 4.8 g/dL (ref 3.6–5.1)
ALT: 26 U/L (ref 9–46)
AST: 27 U/L (ref 10–35)
Alkaline Phosphatase: 75 U/L (ref 40–115)
BILIRUBIN TOTAL: 0.7 mg/dL (ref 0.2–1.2)
BUN: 14 mg/dL (ref 7–25)
CHLORIDE: 102 mmol/L (ref 98–110)
CO2: 28 mmol/L (ref 20–31)
Calcium: 9.7 mg/dL (ref 8.6–10.3)
Creat: 1.18 mg/dL (ref 0.70–1.25)
GLUCOSE: 91 mg/dL (ref 65–99)
Potassium: 4.4 mmol/L (ref 3.5–5.3)
Sodium: 140 mmol/L (ref 135–146)
TOTAL PROTEIN: 7.4 g/dL (ref 6.1–8.1)

## 2015-09-24 LAB — LIPID PANEL
Cholesterol: 204 mg/dL — ABNORMAL HIGH (ref 125–200)
HDL: 48 mg/dL (ref >=40)
LDL CALC: 131 mg/dL — AB (ref <130)
Total CHOL/HDL Ratio: 4.3 Ratio (ref <=5.0)
Triglycerides: 127 mg/dL (ref <150)
VLDL: 25 mg/dL (ref <30)

## 2015-09-24 LAB — CBC
HEMATOCRIT: 46.3 % (ref 39.0–52.0)
HEMOGLOBIN: 16 g/dL (ref 13.0–17.0)
MCH: 31.6 pg (ref 26.0–34.0)
MCHC: 34.6 g/dL (ref 30.0–36.0)
MCV: 91.3 fL (ref 78.0–100.0)
MPV: 11.5 fL (ref 8.6–12.4)
Platelets: 253 10*3/uL (ref 150–400)
RBC: 5.07 MIL/uL (ref 4.22–5.81)
RDW: 13.3 % (ref 11.5–15.5)
WBC: 5.9 10*3/uL (ref 4.0–10.5)

## 2015-09-24 MED ORDER — IPRATROPIUM BROMIDE 0.03 % NA SOLN: 2.0000 | Freq: Four times a day (QID) | NASAL | Status: DC

## 2015-09-24 MED ORDER — SIMVASTATIN 40 MG PO TABS: 40.0000 mg | ORAL_TABLET | Freq: Every day | ORAL | Status: DC

## 2015-09-24 NOTE — Patient Instructions (Signed)
It was great to see you today- you got your last pneumonia shot (prevnar 13) I will be in touch with your labs asap Try the atrovent nasal spray for drip and hacky cough- let me know if this does NOT help you!

## 2015-09-24 NOTE — Progress Notes (Signed)
[facility] [address] [place] [address] [phone]  Date:  09/24/2015   Name:  Jonathan Bridges   DOB:  10/25/1946   MRN:  [id number]  PCP:  Kennon Portela, MD    Chief Complaint: Annual Exam   History of Present Illness:  Jonathan Bridges is a 69 y.o. very pleasant male patient who presents with the following:  Generally healthy man here today for his CPE.  Last seen here a year ago for same- we generally only see him once a year.  Last labs a year ago, looked fine Colonoscopy, tdap, zostavax all UTD He is feeling well, no particular concerns He states that he has "never felt better."  He has had pneumovax one year ago but not prevnar yet Pneumovax 09/20/2014  Patient Active Problem List   Diagnosis Date Noted  . ED (erectile dysfunction) 08/09/2012  . Sleep apnea   . Hypercholesterolemia   . Depression   . Anxiety   . Chronic rhinitis   . Hyperlipidemia 02/29/2012    Past Medical History  Diagnosis Date  . Sleep apnea   . Hypercholesterolemia   . History of hypertension   . Depression   . Anxiety   . Elevated LFTs   . Diverticulosis   . Chronic rhinitis   . Basal cell carcinoma     Past Surgical History  Procedure Laterality Date  . Wrist surgery  01/24/2003    Right- ORIF: Dr. Amedeo Plenty    Social History  Substance Use Topics  . Smoking status: Never Smoker   . Smokeless tobacco: None  . Alcohol Use: No    Family History  Problem Relation Age of Onset  . Diabetes Father     No Known Allergies  Medication list has been reviewed and updated.  Current Outpatient Prescriptions on File Prior to Visit  Medication Sig Dispense Refill  . aspirin 81 MG tablet Take 81 mg by mouth daily.    . Multiple Vitamin (MULTIVITAMIN) tablet Take 1 tablet by mouth daily.    . simvastatin (ZOCOR) 40 MG tablet Take 1 tablet (40 mg total) by mouth daily. 90 tablet 3  . cholecalciferol (VITAMIN D) 1000 UNITS tablet Take 1,000 Units by  mouth daily.     No current facility-administered medications on file prior to visit.    Review of Systems:  As per HPI- otherwise negative.   Physical Examination: Filed Vitals:   09/24/15 1321  BP: 124/70  Pulse: 70  Temp: 98.4 F (36.9 C)  Resp: 16   Filed Vitals:   09/24/15 1321  Height: 5' 10.5" (1.791 m)  Weight: 216 lb 9.6 oz (98.249 kg)   Body mass index is 30.63 kg/(m^2). Ideal Body Weight: Weight in (lb) to have BMI = 25: 176.4  GEN: WDWN, NAD, Non-toxic, A & O x 3 HEENT: Atraumatic, Normocephalic. Neck supple. No masses, No LAD. Ears and Nose: No external deformity. CV: RRR, No M/G/R. No JVD. No thrill. No extra heart sounds. PULM: CTA B, no wheezes, crackles, rhonchi. No retractions. No resp. distress. No accessory muscle use. ABD: S, NT, ND, +BS. No rebound. No HSM. EXTR: No c/c/e NEURO Normal gait.  PSYCH: Normally interactive. Conversant. Not depressed or anxious appearing.  Calm demeanor.  KL:KJZPHX penis, testes/ scrotum and DRE  Assessment and Plan: Physical exam  Flu vaccine need - Plan: Flu Vaccine QUAD 36+ mos IM  Hyperlipidemia - Plan: Lipid panel, simvastatin (ZOCOR) 40 MG tablet  Screening  for prostate cancer - Plan: PSA  Screening for diabetes mellitus - Plan: Comprehensive metabolic panel  Medication monitoring encounter - Plan: CBC  Screening examination for infectious disease - Plan: Hepatitis C antibody  PND (post-nasal drip) - Plan: ipratropium (ATROVENT) 0.03 % nasal spray  Labs pending as above Trial of atrovent for PND- he denies any problems with prostate enlargement  Given prevnar today Refilled cholesterol med  Signed Lamar Blinks, MD

## 2015-09-25 ENCOUNTER — Encounter: Payer: Self-pay | Admitting: Family Medicine

## 2015-09-25 LAB — PSA: PSA: 0.61 ng/mL (ref <=4.00)

## 2016-10-13 ENCOUNTER — Ambulatory Visit (INDEPENDENT_AMBULATORY_CARE_PROVIDER_SITE_OTHER): Payer: Medicare Other | Admitting: Family Medicine

## 2016-10-13 ENCOUNTER — Encounter: Payer: Self-pay | Admitting: Family Medicine

## 2016-10-13 VITALS — BP 140/80 | HR 79 | Temp 98.5°F | Resp 16 | Ht 70.5 in | Wt 213.8 lb

## 2016-10-13 DIAGNOSIS — B351 Tinea unguium: Secondary | ICD-10-CM

## 2016-10-13 DIAGNOSIS — Z23 Encounter for immunization: Secondary | ICD-10-CM | POA: Diagnosis not present

## 2016-10-13 DIAGNOSIS — R0982 Postnasal drip: Secondary | ICD-10-CM | POA: Diagnosis not present

## 2016-10-13 DIAGNOSIS — Z0000 Encounter for general adult medical examination without abnormal findings: Secondary | ICD-10-CM

## 2016-10-13 LAB — POCT URINALYSIS DIP (MANUAL ENTRY)
BILIRUBIN UA: NEGATIVE
GLUCOSE UA: NEGATIVE
Ketones, POC UA: NEGATIVE
Leukocytes, UA: NEGATIVE
NITRITE UA: NEGATIVE
Protein Ur, POC: NEGATIVE
RBC UA: NEGATIVE
Spec Grav, UA: 1.02
Urobilinogen, UA: 0.2
pH, UA: 6.5

## 2016-10-13 LAB — COMPREHENSIVE METABOLIC PANEL
ALBUMIN: 4.4 g/dL (ref 3.6–5.1)
ALT: 28 U/L (ref 9–46)
AST: 32 U/L (ref 10–35)
Alkaline Phosphatase: 62 U/L (ref 40–115)
BUN: 16 mg/dL (ref 7–25)
CHLORIDE: 103 mmol/L (ref 98–110)
CO2: 28 mmol/L (ref 20–31)
CREATININE: 1.25 mg/dL (ref 0.70–1.25)
Calcium: 9.5 mg/dL (ref 8.6–10.3)
Glucose, Bld: 96 mg/dL (ref 65–99)
POTASSIUM: 5.1 mmol/L (ref 3.5–5.3)
SODIUM: 138 mmol/L (ref 135–146)
TOTAL PROTEIN: 7 g/dL (ref 6.1–8.1)
Total Bilirubin: 0.7 mg/dL (ref 0.2–1.2)

## 2016-10-13 LAB — LIPID PANEL
CHOLESTEROL: 171 mg/dL (ref 125–200)
HDL: 49 mg/dL (ref >=40)
LDL Cholesterol: 101 mg/dL (ref <130)
TRIGLYCERIDES: 103 mg/dL (ref <150)
Total CHOL/HDL Ratio: 3.5 Ratio (ref <=5.0)
VLDL: 21 mg/dL (ref <30)

## 2016-10-13 LAB — PSA: PSA: 0.5 ng/mL (ref <=4.0)

## 2016-10-13 MED ORDER — IPRATROPIUM BROMIDE 0.03 % NA SOLN: 2.0000 | Freq: Four times a day (QID) | NASAL | 6 refills | Status: DC

## 2016-10-13 NOTE — Progress Notes (Signed)
QUICK REFERENCE INFORMATION: The ABCs of Providing the Annual Wellness Visit  CMS.gov Medicare Learning Network  [employer] Annual Wellness Visit  Subjective:   Jonathan Bridges is a 70 y.o. Male who presents for an Annual Wellness Visit.  Patient Active Problem List   Diagnosis Date Noted  . ED (erectile dysfunction) 08/09/2012  . Sleep apnea   . Hypercholesterolemia   . Depression   . Anxiety   . Chronic rhinitis   . Hyperlipidemia 02/29/2012   Past Medical History:  Diagnosis Date  . Anxiety   . Basal cell carcinoma   . Chronic rhinitis   . Depression   . Diverticulosis   . Elevated LFTs   . History of hypertension   . Hypercholesterolemia   . Sleep apnea      Past Surgical History:  Procedure Laterality Date  . WRIST SURGERY  01/24/2003   Right- ORIF: Dr. Amedeo Plenty     Outpatient Medications Prior to Visit  Medication Sig Dispense Refill  . aspirin 81 MG tablet Take 81 mg by mouth daily.    . cholecalciferol (VITAMIN D) 1000 UNITS tablet Take 1,000 Units by mouth daily.    . Multiple Vitamin (MULTIVITAMIN) tablet Take 1 tablet by mouth daily.    . simvastatin (ZOCOR) 40 MG tablet Take 1 tablet (40 mg total) by mouth daily. 90 tablet 3  . ipratropium (ATROVENT) 0.03 % nasal spray Place 2 sprays into the nose 4 (four) times daily. (Patient not taking: Reported on 10/13/2016) 30 mL 6   No facility-administered medications prior to visit.     No Known Allergies   Family History  Problem Relation Age of Onset  . Diabetes Father      Social History   Social History  . Marital status: Married    Spouse name: N/A  . Number of children: N/A  . 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 Smoking status: Never Smoker  . Smokeless tobacco: None  . Alcohol use No  . Drug use: No  . Sexual activity: Yes   Other Topics Concern  . None   Social History Narrative   Exercise walking            Recent Hospitalizations? no  Health Habits: Current exercise activities include: walking and yard work Exercise: 5 times/week. Diet: in general, a "healthy" diet    Alcohol intake: none  Health Risk Assessment: The patient has completed a Health Risk Assessment. This has been reveiwed with them and has been scanned into the [facility] system as an attached document.  Current Medical Providers and Suppliers: [facility] Patient Care Team: Forrest Moron, MD as PCP - General (Internal Medicine) Irene Shipper, MD (Gastroenterology) Roseanne Kaufman, MD (Orthopedic Surgery) Lelon Perla, MD (Cardiology) Ralene Bathe, MD as Consulting Physician (Ophthalmology) No future appointments.   Age-appropriate Screening Schedule: The list below includes current immunization status and future screening recommendations based on patient's age. Orders for these recommended tests are listed in the plan section. The patient has been provided with a written plan. Immunization History  Administered Date(s) Administered  . Hepatitis A 12/03/2009  . Influenza,inj,Quad PF,36+ Mos 09/19/2013, 09/20/2014, 09/24/2015, 10/13/2016  . Pneumococcal Conjugate-13 09/24/2015  . Pneumococcal Polysaccharide-23 09/21/2003, 09/20/2014  . Tdap 07/22/2010  . Zoster 07/14/2011    Health Maintenance reviewed - immunizations up to date, colonoscopy up to date   Depression screen [facility] 2/9 10/13/2016 09/24/2015 09/20/2014 09/19/2013  Decreased Interest 0 0 0 0  Down, Depressed, Hopeless 0 0 - 0  PHQ - 2 Score 0 0 0 0    Depression Severity and Treatment Recommendations:  0-4= None  5-9= Mild / Treatment: Support, educate to call if worse; return in one month  10-14= Moderate / Treatment: Support, watchful waiting; Antidepressant or Psycotherapy  15-19= Moderately severe / Treatment: Antidepressant OR Psychotherapy  >= 20 = Major depression, severe / Antidepressant AND Psychotherapy  Functional Status Survey: Is  the patient deaf or have difficulty hearing?: No Does the patient have difficulty seeing, even when wearing glasses/contacts?: No Does the patient have difficulty concentrating, remembering, or making decisions?: No Does the patient have difficulty walking or climbing stairs?: No Does the patient have difficulty dressing or bathing?: No Does the patient have difficulty doing errands alone such as visiting a doctor's office or shopping?: No   Hearing Evaluation: 1. Do you have trouble hearing the television when others do not? no 2. Do you have to strain to hear/understand conversations? no   Advanced Care Planning: 1. Patient has executed an Advance Directive: yes  2. If no, patient was given the opportunity to execute an Advance Directive today? n/a  3. Are the patient's advanced directives in [place]? no 4. This patient has the ability to prepare an Advance Directive: yes 5. Provider is willing to follow the patient's wishes: yes  Cognitive Assessment: Does the patient have evidence of cognitive impairment? no The patient does not have any evidence of any cognitive problems and denies any  change in mood/affect, appearance, speech, memory or motor skills.  Identification of Risk Factors: Risk factors include: none   Review of Systems  Constitutional: Negative for chills, fever and malaise/fatigue.  Eyes: Negative for blurred vision and double vision.  Respiratory: Negative for cough, shortness of breath and wheezing.   Cardiovascular: Negative for chest pain, palpitations and leg swelling.  Gastrointestinal: Negative for abdominal pain, nausea and vomiting.  Genitourinary: Negative for dysuria, frequency and urgency.  Neurological: Negative for dizziness, tingling and tremors.  Psychiatric/Behavioral: Negative for depression. The patient is not nervous/anxious.      Objective:   Vitals:   10/13/16 0823 10/13/16 0833  BP: (!) 150/70 140/80  Pulse: 79   Resp: 16   Temp:  98.5 F (36.9 C)    Wt Readings from Last 3 Encounters:  10/13/16 213 lb 12.8 oz (97 kg)  09/24/15 216 lb 9.6 oz (98.2 kg)  09/20/14 219 lb 6.4 oz (99.5 kg)    Body mass index is 30.24 kg/m.  Physical Exam  Constitutional: He is oriented to person, place, and time. He appears well-developed and well-nourished.  HENT:  Head: Normocephalic and atraumatic.  Right Ear: External ear normal.  Left Ear: External ear normal.  Eyes: Conjunctivae and EOM are normal. Pupils are equal, round, and reactive to light.  Cardiovascular: Normal rate, regular rhythm and normal heart sounds.   Pulmonary/Chest: Effort normal and breath sounds normal. He has no wheezes.  Neurological: He is alert and oriented to person, place, and time. No cranial nerve deficit.  Skin: Skin is warm.  Thickened gray appearing toe nail of the great toe on the left foot  Psychiatric: He has a normal mood and affect. His behavior is normal. Judgment and thought content normal.     Assessment/Plan:   Patient Self-Management and Personalized Health Advice The patient has been provided with information about:   During the course of the visit the patient was  educated and counseled about appropriate screening and preventive services including:    Body mass index is 30.24 kg/m. Discussed the patient's BMI with him. The BMI BMI is not in the acceptable range; BMI management plan is completed  Forrester was seen today for annual exam.  Diagnoses and all orders for this visit:  Needs flu shot -     Flu Vaccine QUAD 36+ mos IM  Encounter for Medicare annual wellness exam- reviewed advance care directive, immunizations, and other health screenings Labs today as follows -     Comprehensive metabolic panel -     Lipid panel -     POCT urinalysis dipstick -     PSA  Onychomycosis- discussed oral therapy which pt declined -     Ambulatory referral to Podiatry  PND (post-nasal drip) -     ipratropium (ATROVENT) 0.03 %  nasal spray; Place 2 sprays into the nose 4 (four) times daily.     No Follow-up on file.  No future appointments.  Patient Instructions       IF you received an x-ray today, you will receive an invoice from [facility]. Please contact [facility] at [phone] with questions or concerns regarding your invoice.   IF you received labwork today, you will receive an invoice from [employer]. Please contact [facility] at [phone] with questions or concerns regarding your invoice.   Our billing staff will not be able to assist you with questions regarding bills from these companies.  You will be contacted with the lab results as soon as they are available. The fastest way to get your results is to activate your My Chart account. Instructions are located on the last page of this paperwork. If you have not heard from [place] regarding the results in 2 weeks, please contact this office.    Influenza (Flu) Vaccine (Inactivated or Recombinant):  1. Why get vaccinated? Influenza ("flu") is a contagious disease that spreads around the [place] every year, usually between October and May. Flu is caused by influenza viruses, and is spread mainly by coughing, sneezing, and close contact. Anyone can get flu. Flu strikes suddenly and can last several days. Symptoms vary by age, but can include:  fever/chills  sore throat  muscle aches  fatigue  cough  headache  runny or stuffy nose Flu can also lead to pneumonia and blood infections, and cause diarrhea and seizures in children. If you have a medical condition, such as heart or lung disease, flu can make it worse. Flu is more dangerous for some people. Infants and young children, people 20 years of age and older, pregnant women, and people with certain health conditions or a weakened immune system are at greatest risk. Each year thousands of people in the [place] die from flu, and many  more are hospitalized. Flu vaccine can:  keep you from getting flu,  make flu less severe if you do get it, and  keep you from spreading flu to your family and other people. 2. Inactivated and recombinant flu vaccines A dose of flu vaccine is recommended every flu season. Children 6 months through 62 years of age may need two doses during the same flu season. Everyone else needs only one dose each flu season. Some inactivated flu vaccines contain a very small amount of a mercury-based preservative called thimerosal. Studies have not shown thimerosal in vaccines to be harmful, but flu vaccines that do not contain thimerosal are available. There is no live  flu virus in flu shots. They cannot cause the flu. There are many flu viruses, and they are always changing. Each year a new flu vaccine is made to protect against three or four viruses that are likely to cause disease in the upcoming flu season. But even when the vaccine doesn't exactly match these viruses, it may still provide some protection. Flu vaccine cannot prevent:  flu that is caused by a virus not covered by the vaccine, or  illnesses that look like flu but are not. It takes about 2 weeks for protection to develop after vaccination, and protection lasts through the flu season. 3. Some people should not get this vaccine Tell the person who is giving you the vaccine:  If you have any severe, life-threatening allergies. If you ever had a life-threatening allergic reaction after a dose of flu vaccine, or have a severe allergy to any part of this vaccine, you may be advised not to get vaccinated. Most, but not all, types of flu vaccine contain a small amount of egg protein.  If you ever had Guillain-Barre Syndrome (also called GBS). Some people with a history of GBS should not get this vaccine. This should be discussed with your doctor.  If you are not feeling well. It is usually okay to get flu vaccine when you have a mild illness, but  you might be asked to come back when you feel better. 4. Risks of a vaccine reaction With any medicine, including vaccines, there is a chance of reactions. These are usually mild and go away on their own, but serious reactions are also possible. Most people who get a flu shot do not have any problems with it. Minor problems following a flu shot include:  soreness, redness, or swelling where the shot was given  hoarseness  sore, red or itchy eyes  cough  fever  aches  headache  itching  fatigue If these problems occur, they usually begin soon after the shot and last 1 or 2 days. More serious problems following a flu shot can include the following:  There may be a small increased risk of Guillain-Barre Syndrome (GBS) after inactivated flu vaccine. This risk has been estimated at 1 or 2 additional cases per million people vaccinated. This is much lower than the risk of severe complications from flu, which can be prevented by flu vaccine.  Young children who get the flu shot along with pneumococcal vaccine (PCV13) and/or DTaP vaccine at the same time might be slightly more likely to have a seizure caused by fever. Ask your doctor for more information. Tell your doctor if a child who is getting flu vaccine has ever had a seizure. Problems that could happen after any injected vaccine:  People sometimes faint after a medical procedure, including vaccination. Sitting or lying down for about 15 minutes can help prevent fainting, and injuries caused by a fall. Tell your doctor if you feel dizzy, or have vision changes or ringing in the ears.  Some people get severe pain in the shoulder and have difficulty moving the arm where a shot was given. This happens very rarely.  Any medication can cause a severe allergic reaction. Such reactions from a vaccine are very rare, estimated at about 1 in a million doses, and would happen within a few minutes to a few hours after the vaccination. As with  any medicine, there is a very remote chance of a vaccine causing a serious injury or death. The safety of vaccines is always  being monitored. For more information, visit: http://www.aguilar.org/ 5. What if there is a serious reaction? What should I look for?  Look for anything that concerns you, such as signs of a severe allergic reaction, very high fever, or unusual behavior. Signs of a severe allergic reaction can include hives, swelling of the face and throat, difficulty breathing, a fast heartbeat, dizziness, and weakness. These would start a few minutes to a few hours after the vaccination. What should I do?  If you think it is a severe allergic reaction or other emergency that can't wait, call 9-1-1 and get the person to the nearest hospital. Otherwise, call your doctor.  Reactions should be reported to the Vaccine Adverse Event Reporting System (VAERS). Your doctor should file this report, or you can do it yourself through the VAERS web site at www.vaers.SamedayNews.es, or by calling 6056692104. VAERS does not give medical advice. 6. The National Vaccine Injury Compensation Program The [employer] Vaccine Injury Compensation Program (VICP) is a federal program that was created to compensate people who may have been injured by certain vaccines. Persons who believe they may have been injured by a vaccine can learn about the program and about filing a claim by calling 214-364-8537 or visiting the [place] website at GoldCloset.com.ee. There is a time limit to file a claim for compensation. 7. How can I learn more?  Ask your healthcare provider. He or she can give you the vaccine package insert or suggest other sources of information.  Call your local or state health department.  Contact the Centers for Disease Control and Prevention (CDC):  Call (425) 646-3360 (1-800-CDC-INFO) or  Visit CDC's website at https://gibson.com/ Vaccine Information Statement Inactivated Influenza  Vaccine (07/19/2014)   This information is not intended to replace advice given to you by your health care provider. Make sure you discuss any questions you have with your health care provider.   Document Released: 09/23/2006 Document Revised: 12/20/2014 Document Reviewed: 07/22/2014 Elsevier Interactive Patient Education 2016 [place] Antigen Test WHY AM I HAVING THIS TEST? The prostate-specific antigen (PSA) test is performed to determine how much PSA you have in your blood. PSA is a type of protein that is normally present in the prostate gland. Certain conditions can cause PSA blood levels to increase, such as:  Infection in the prostate (prostatitis).  Enlargement of the prostate (hypertrophy).  Prostate cancer. Because PSA levels increase greatly from prostate cancer, this test can be used to confirm a diagnosis of prostate cancer. It may also be used to monitor treatment for prostate cancer and to watch for a return of prostate cancer after treatment has finished.  This test has a very high false-positive rate. Therefore, routine PSA screening for all men is no longer recommended. A false-positive result is incorrect because it indicates a condition or finding is present when it is not.  WHAT KIND OF SAMPLE IS TAKEN?  A blood sample is required for this test. It is usually collected by inserting a needle into a vein or by sticking a finger with a small needle. HOW DO I PREPARE FOR THE TEST? There is no preparation required for this test. However, there are factors that can affect the results of a PSA test. To get the most accurate results:  Avoid having a rectal exam within several hours before having your blood drawn for this test.   Avoid having any procedures performed on the prostate gland within 6 weeks of having this test.   Avoid ejaculating within 24 hours  of having this test.   Tell your health care provider if you had a recent urinary tract  infection (UTI).  Tell your health care provider if you are taking medicines to assist with hair growth, such as finasteride.  Tell your health care provider if you have been exposed to a medicine called diethylstilbestrol. Let your health care provider know if any of these factors apply to you. You may be asked to reschedule the test. WHAT ARE THE REFERENCE RANGES? Reference ranges are established after testing a large group of people. Reference ranges may vary among different people, labs, and hospitals. It is your responsibility to obtain your test results. Ask the lab or department performing the test when and how you will get your results.  Low: 0-2.5 ng/mL.  Slightly to moderately elevated: 2.6-10.0 ng/mL.  Moderately elevated: 10.0-19.9 ng/mL.  Significantly elevated: 20 ng/mL or greater. WHAT DO THE RESULTS MEAN? PSA test results greater than 4 ng/mL are found in the majority of men with prostate cancer. If your test result is above this level, this can indicate an increased risk for prostate cancer. Increased PSA levels can also indicate other health conditions. Talk with your health care provider to discuss your results, treatment options, and if necessary, the need for more tests. Talk with your health care provider if you have any questions about your results.   This information is not intended to replace advice given to you by your health care provider. Make sure you discuss any questions you have with your health care provider.   Document Released: 01/01/2005 Document Revised: 08/20/2015 Document Reviewed: 04/24/2014 Elsevier Interactive Patient Education [employer].    An after visit summary with all of these plans was given to the patient.

## 2016-10-13 NOTE — Patient Instructions (Addendum)
   IF you received an x-ray today, you will receive an invoice from [facility]. Please contact [facility] at [phone] with questions or concerns regarding your invoice.   IF you received labwork today, you will receive an invoice from [facility]. Please contact [facility] at [phone] with questions or concerns regarding your invoice.   Our billing staff will not be able to assist you with questions regarding bills from these companies.  You will be contacted with the lab results as soon as they are available. The fastest way to get your results is to activate your My Chart account. Instructions are located on the last page of this paperwork. If you have not heard from us regarding the results in 2 weeks, please contact this office.    Influenza (Flu) Vaccine (Inactivated or Recombinant):  1. Why get vaccinated? Influenza ("flu") is a contagious disease that spreads around the [place] every year, usually between October and May. Flu is caused by influenza viruses, and is spread mainly by coughing, sneezing, and close contact. Anyone can get flu. Flu strikes suddenly and can last several days. Symptoms vary by age, but can include:  fever/chills  sore throat  muscle aches  fatigue  cough  headache  runny or stuffy nose Flu can also lead to pneumonia and blood infections, and cause diarrhea and seizures in children. If you have a medical condition, such as heart or lung disease, flu can make it worse. Flu is more dangerous for some people. Infants and young children, people 65 years of age and older, pregnant women, and people with certain health conditions or a weakened immune system are at greatest risk. Each year thousands of people in the [place] die from flu, and many more are hospitalized. Flu vaccine can:  keep you from getting flu,  make flu less severe if you do get it, and  keep you from spreading flu to  your family and other people. 2. Inactivated and recombinant flu vaccines A dose of flu vaccine is recommended every flu season. Children 6 months through 8 years of age may need two doses during the same flu season. Everyone else needs only one dose each flu season. Some inactivated flu vaccines contain a very small amount of a mercury-based preservative called thimerosal. Studies have not shown thimerosal in vaccines to be harmful, but flu vaccines that do not contain thimerosal are available. There is no live flu virus in flu shots. They cannot cause the flu. There are many flu viruses, and they are always changing. Each year a new flu vaccine is made to protect against three or four viruses that are likely to cause disease in the upcoming flu season. But even when the vaccine doesn't exactly match these viruses, it may still provide some protection. Flu vaccine cannot prevent:  flu that is caused by a virus not covered by the vaccine, or  illnesses that look like flu but are not. It takes about 2 weeks for protection to develop after vaccination, and protection lasts through the flu season. 3. Some people should not get this vaccine Tell the person who is giving you the vaccine:  If you have any severe, life-threatening allergies. If you ever had a life-threatening allergic reaction after a dose of flu vaccine, or have a severe allergy to any part of this vaccine, you may be advised not to get vaccinated. Most, but not all, types of flu vaccine contain a small amount of egg protein.    If you ever had Guillain-Barre Syndrome (also called GBS). Some people with a history of GBS should not get this vaccine. This should be discussed with your doctor.  If you are not feeling well. It is usually okay to get flu vaccine when you have a mild illness, but you might be asked to come back when you feel better. 4. Risks of a vaccine reaction With any medicine, including vaccines, there is a chance of  reactions. These are usually mild and go away on their own, but serious reactions are also possible. Most people who get a flu shot do not have any problems with it. Minor problems following a flu shot include:  soreness, redness, or swelling where the shot was given  hoarseness  sore, red or itchy eyes  cough  fever  aches  headache  itching  fatigue If these problems occur, they usually begin soon after the shot and last 1 or 2 days. More serious problems following a flu shot can include the following:  There may be a small increased risk of Guillain-Barre Syndrome (GBS) after inactivated flu vaccine. This risk has been estimated at 1 or 2 additional cases per million people vaccinated. This is much lower than the risk of severe complications from flu, which can be prevented by flu vaccine.  Young children who get the flu shot along with pneumococcal vaccine (PCV13) and/or DTaP vaccine at the same time might be slightly more likely to have a seizure caused by fever. Ask your doctor for more information. Tell your doctor if a child who is getting flu vaccine has ever had a seizure. Problems that could happen after any injected vaccine:  People sometimes faint after a medical procedure, including vaccination. Sitting or lying down for about 15 minutes can help prevent fainting, and injuries caused by a fall. Tell your doctor if you feel dizzy, or have vision changes or ringing in the ears.  Some people get severe pain in the shoulder and have difficulty moving the arm where a shot was given. This happens very rarely.  Any medication can cause a severe allergic reaction. Such reactions from a vaccine are very rare, estimated at about 1 in a million doses, and would happen within a few minutes to a few hours after the vaccination. As with any medicine, there is a very remote chance of a vaccine causing a serious injury or death. The safety of vaccines is always being monitored. For  more information, visit: www.cdc.gov/vaccinesafety/ 5. What if there is a serious reaction? What should I look for?  Look for anything that concerns you, such as signs of a severe allergic reaction, very high fever, or unusual behavior. Signs of a severe allergic reaction can include hives, swelling of the face and throat, difficulty breathing, a fast heartbeat, dizziness, and weakness. These would start a few minutes to a few hours after the vaccination. What should I do?  If you think it is a severe allergic reaction or other emergency that can't wait, call 9-1-1 and get the person to the nearest hospital. Otherwise, call your doctor.  Reactions should be reported to the Vaccine Adverse Event Reporting System (VAERS). Your doctor should file this report, or you can do it yourself through the VAERS web site at www.vaers.hhs.gov, or by calling 1-800-822-7967. VAERS does not give medical advice. 6. The National Vaccine Injury Compensation Program The National Vaccine Injury Compensation Program (VICP) is a federal program that was created to compensate people who may have been   injured by certain vaccines. Persons who believe they may have been injured by a vaccine can learn about the program and about filing a claim by calling 5793211615 or visiting the [place] website at GoldCloset.com.ee. There is a time limit to file a claim for compensation. 7. How can I learn more?  Ask your healthcare provider. He or she can give you the vaccine package insert or suggest other sources of information.  Call your local or state health department.  Contact the Centers for Disease Control and Prevention (CDC):  Call 854-881-0980 (1-800-CDC-INFO) or  Visit CDC's website at https://gibson.com/ Vaccine Information Statement Inactivated Influenza Vaccine (07/19/2014)   This information is not intended to replace advice given to you by your health care provider. Make sure you discuss any  questions you have with your health care provider.   Document Released: 09/23/2006 Document Revised: 12/20/2014 Document Reviewed: 07/22/2014 Elsevier Interactive Patient Education 2016 [place] Antigen Test WHY AM I HAVING THIS TEST? The prostate-specific antigen (PSA) test is performed to determine how much PSA you have in your blood. PSA is a type of protein that is normally present in the prostate gland. Certain conditions can cause PSA blood levels to increase, such as:  Infection in the prostate (prostatitis).  Enlargement of the prostate (hypertrophy).  Prostate cancer. Because PSA levels increase greatly from prostate cancer, this test can be used to confirm a diagnosis of prostate cancer. It may also be used to monitor treatment for prostate cancer and to watch for a return of prostate cancer after treatment has finished.  This test has a very high false-positive rate. Therefore, routine PSA screening for all men is no longer recommended. A false-positive result is incorrect because it indicates a condition or finding is present when it is not.  WHAT KIND OF SAMPLE IS TAKEN?  A blood sample is required for this test. It is usually collected by inserting a needle into a vein or by sticking a finger with a small needle. HOW DO I PREPARE FOR THE TEST? There is no preparation required for this test. However, there are factors that can affect the results of a PSA test. To get the most accurate results:  Avoid having a rectal exam within several hours before having your blood drawn for this test.   Avoid having any procedures performed on the prostate gland within 6 weeks of having this test.   Avoid ejaculating within 24 hours of having this test.   Tell your health care provider if you had a recent urinary tract infection (UTI).  Tell your health care provider if you are taking medicines to assist with hair growth, such as finasteride.  Tell your health  care provider if you have been exposed to a medicine called diethylstilbestrol. Let your health care provider know if any of these factors apply to you. You may be asked to reschedule the test. WHAT ARE THE REFERENCE RANGES? Reference ranges are established after testing a large group of people. Reference ranges may vary among different people, labs, and hospitals. It is your responsibility to obtain your test results. Ask the lab or department performing the test when and how you will get your results.  Low: 0-2.5 ng/mL.  Slightly to moderately elevated: 2.6-10.0 ng/mL.  Moderately elevated: 10.0-19.9 ng/mL.  Significantly elevated: 20 ng/mL or greater. WHAT DO THE RESULTS MEAN? PSA test results greater than 4 ng/mL are found in the majority of men with prostate cancer. If your test result is above this  level, this can indicate an increased risk for prostate cancer. Increased PSA levels can also indicate other health conditions. Talk with your health care provider to discuss your results, treatment options, and if necessary, the need for more tests. Talk with your health care provider if you have any questions about your results.   This information is not intended to replace advice given to you by your health care provider. Make sure you discuss any questions you have with your health care provider.   Document Released: 01/01/2005 Document Revised: 08/20/2015 Document Reviewed: 04/24/2014 Elsevier Interactive Patient Education [employer].

## 2016-11-12 ENCOUNTER — Other Ambulatory Visit: Payer: Self-pay

## 2016-11-12 MED ORDER — SIMVASTATIN 40 MG PO TABS: 40.0000 mg | ORAL_TABLET | Freq: Every day | ORAL | 1 refills | Status: DC

## 2016-11-12 NOTE — Telephone Encounter (Signed)
Patient states that pharmacy faxed a request for simvastatin on Monday 11/27 and they haven't gotten a response. Please advise!  [facility] on [address]

## 2016-11-12 NOTE — Telephone Encounter (Signed)
09/12/16 last ov and labs

## 2017-07-13 ENCOUNTER — Other Ambulatory Visit: Payer: Self-pay | Admitting: Family Medicine

## 2017-10-03 ENCOUNTER — Telehealth: Payer: Self-pay

## 2017-10-03 NOTE — Telephone Encounter (Signed)
Called pt to schedule Medicare Annual Wellness Visit after 10/13/17 and before upcoming PCP visit in November.    Jonathan Bridges, B.A.  Care Guide [phone]

## 2017-10-14 ENCOUNTER — Ambulatory Visit: Payer: Medicare Other

## 2017-10-17 ENCOUNTER — Ambulatory Visit (INDEPENDENT_AMBULATORY_CARE_PROVIDER_SITE_OTHER): Payer: Medicare Other | Admitting: Family Medicine

## 2017-10-17 ENCOUNTER — Encounter: Payer: Self-pay | Admitting: Family Medicine

## 2017-10-17 VITALS — BP 140/72 | HR 99 | Temp 98.9°F | Resp 18 | Ht 70.47 in | Wt 211.4 lb

## 2017-10-17 DIAGNOSIS — R609 Edema, unspecified: Secondary | ICD-10-CM

## 2017-10-17 DIAGNOSIS — E7800 Pure hypercholesterolemia, unspecified: Secondary | ICD-10-CM | POA: Diagnosis not present

## 2017-10-17 DIAGNOSIS — Z23 Encounter for immunization: Secondary | ICD-10-CM

## 2017-10-17 DIAGNOSIS — J310 Chronic rhinitis: Secondary | ICD-10-CM | POA: Diagnosis not present

## 2017-10-17 DIAGNOSIS — N5203 Combined arterial insufficiency and corporo-venous occlusive erectile dysfunction: Secondary | ICD-10-CM

## 2017-10-17 DIAGNOSIS — Z0000 Encounter for general adult medical examination without abnormal findings: Secondary | ICD-10-CM | POA: Diagnosis not present

## 2017-10-17 DIAGNOSIS — Z125 Encounter for screening for malignant neoplasm of prostate: Secondary | ICD-10-CM

## 2017-10-17 MED ORDER — SILDENAFIL CITRATE 25 MG PO TABS: 25.0000 mg | ORAL_TABLET | Freq: Every day | ORAL | 0 refills | Status: DC | PRN

## 2017-10-17 MED ORDER — HYDROCHLOROTHIAZIDE 25 MG PO TABS: 12.5000 mg | ORAL_TABLET | ORAL | 1 refills | Status: DC

## 2017-10-17 MED ORDER — FLUTICASONE PROPIONATE 50 MCG/ACT NA SUSP: 2.0000 | Freq: Every day | NASAL | 6 refills | Status: DC

## 2017-10-17 NOTE — Patient Instructions (Addendum)
Today I prescribed sildenafil (The generic for viagra)  Common side effects are headache, runny nose, sustained erection (an emergency if the erection does not go down), and lightheadedness   Follow up to check your blood pressure  For your lower legs that are swelling up avoid salty foods, and canned meats  Wear compression stockings to wear to help reduce the ankle swelling   Follow up in 3 months to discuss increased urination and lower extremity edema  IF you received an x-ray today, you will receive an invoice from [facility]. Please contact [facility] at [phone] with questions or concerns regarding your invoice.   IF you received labwork today, you will receive an invoice from [facility]. Please contact [facility] at [phone] with questions or concerns regarding your invoice.   Our billing staff will not be able to assist you with questions regarding bills from these companies.  You will be contacted with the lab results as soon as they are available. The fastest way to get your results is to activate your My Chart account. Instructions are located on the last page of this paperwork. If you have not heard from [place] regarding the results in 2 weeks, please contact this office.     Edema Edema is an abnormal buildup of fluids in your bodytissues. Edema is somewhatdependent on gravity to pull the fluid to the lowest place in your body. That makes the condition more common in the legs and thighs (lower extremities). Painless swelling of the feet and ankles is common and becomes more likely as you get older. It is also common in looser tissues, like around your eyes. When the affected area is squeezed, the fluid may move out of that spot and leave a dent for a few moments. This dent is called pitting. What are the causes? There are many possible causes of edema. Eating too much salt and being on your feet or sitting for a long time can cause edema in your  legs and ankles. Hot weather may make edema worse. Common medical causes of edema include:  Heart failure.  Liver disease.  Kidney disease.  Weak blood vessels in your legs.  Cancer.  An injury.  Pregnancy.  Some medications.  Obesity.  What are the signs or symptoms? Edema is usually painless.Your skin may look swollen or shiny. How is this diagnosed? Your health care provider may be able to diagnose edema by asking about your medical history and doing a physical exam. You may need to have tests such as X-rays, an electrocardiogram, or blood tests to check for medical conditions that may cause edema. How is this treated? Edema treatment depends on the cause. If you have heart, liver, or kidney disease, you need the treatment appropriate for these conditions. General treatment may include:  Elevation of the affected body part above the level of your heart.  Compression of the affected body part. Pressure from elastic bandages or support stockings squeezes the tissues and forces fluid back into the blood vessels. This keeps fluid from entering the tissues.  Restriction of fluid and salt intake.  Use of a water pill (diuretic). These medications are appropriate only for some types of edema. They pull fluid out of your body and make you urinate more often. This gets rid of fluid and reduces swelling, but diuretics can have side effects. Only use diuretics as directed by your health care provider.  Follow these instructions at home:  Keep the affected body part above the level  of your heart when you are lying down.  Do not sit still or stand for prolonged periods.  Do not put anything directly under your knees when lying down.  Do not wear constricting clothing or garters on your upper legs.  Exercise your legs to work the fluid back into your blood vessels. This may help the swelling go down.  Wear elastic bandages or support stockings to reduce ankle swelling as directed  by your health care provider.  Eat a low-salt diet to reduce fluid if your health care provider recommends it.  Only take medicines as directed by your health care provider. Contact a health care provider if:  Your edema is not responding to treatment.  You have heart, liver, or kidney disease and notice symptoms of edema.  You have edema in your legs that does not improve after elevating them.  You have sudden and unexplained weight gain. Get help right away if:  You develop shortness of breath or chest pain.  You cannot breathe when you lie down.  You develop pain, redness, or warmth in the swollen areas.  You have heart, liver, or kidney disease and suddenly get edema.  You have a fever and your symptoms suddenly get worse. This information is not intended to replace advice given to you by your health care provider. Make sure you discuss any questions you have with your health care provider. Document Released: 11/29/2005 Document Revised: 05/06/2016 Document Reviewed: 09/21/2013 Elsevier Interactive Patient Education  2017 [employer].

## 2017-10-17 NOTE — Progress Notes (Signed)
QUICK REFERENCE INFORMATION: The ABCs of Providing the Annual Wellness Visit  CMS.gov Medicare Learning Network  [employer] Annual Wellness Visit  Subjective:   Jonathan Bridges is a 71 y.o. Male who presents for an Annual Wellness Visit.   Patient Active Problem List   Diagnosis Date Noted  . ED (erectile dysfunction) 08/09/2012  . Sleep apnea   . Hypercholesterolemia   . Depression   . Anxiety   . Chronic rhinitis   . Hyperlipidemia 02/29/2012    Past Medical History:  Diagnosis Date  . Anxiety   . Basal cell carcinoma   . Chronic rhinitis   . Depression   . Diverticulosis   . Elevated LFTs   . History of hypertension   . Hypercholesterolemia   . Sleep apnea      Past Surgical History:  Procedure Laterality Date  . WRIST SURGERY  01/24/2003   Right- ORIF: Dr. Amedeo Plenty     Outpatient Medications Prior to Visit  Medication Sig Dispense Refill  . aspirin 81 MG tablet Take 81 mg by mouth daily.    . cholecalciferol (VITAMIN D) 1000 UNITS tablet Take 1,000 Units by mouth daily.    . Multiple Vitamin (MULTIVITAMIN) tablet Take 1 tablet by mouth daily.    . Pyridoxine HCl (VITAMIN B-6 PO) Take by mouth.    . simvastatin (ZOCOR) 40 MG tablet TAKE 1 TABLET(40 MG) BY MOUTH DAILY 90 tablet 0  . thiamine (VITAMIN B-1) 100 MG tablet Take 100 mg daily by mouth.    Marland Kitchen ipratropium (ATROVENT) 0.03 % nasal spray Place 2 sprays into the nose 4 (four) times daily. 30 mL 6   No facility-administered medications prior to visit.     No Known Allergies   Family History  Problem Relation Age of Onset  . Diabetes Father      Social History   Socioeconomic History  . Marital status: Married    Spouse name: None  . Number of children: None  . Years of education: None  . Highest education level: None  Social Needs  . Financial resource strain: None  . Food insecurity - worry: None  . Food insecurity - inability: None  . Transportation needs - medical: None  . Transportation  needs - non-medical: None  Occupational History  . Occupation: Architectural technologist: [employer]  Tobacco Use  . Smoking status: Never Smoker  . Smokeless tobacco: Never Used  Substance and Sexual Activity  . Alcohol use: No  . Drug use: No  . Sexual activity: Yes  Other Topics Concern  . None  Social History Narrative   Exercise walking            Recent Hospitalizations? no  Health Habits: Current exercise activities include: walking Exercise: 3 times/week. Diet: in general, a "healthy" diet    Alcohol intake: not asked   Health Risk Assessment: The patient has completed a Health Risk Assessment. This has been reveiwed with them and has been scanned into the [place] system as an attached document.  Current Medical Providers and Suppliers: [facility] Patient Care Team: Forrest Moron, MD as PCP - General (Internal Medicine) Irene Shipper, MD (Gastroenterology) Roseanne Kaufman, MD (Orthopedic Surgery) Lelon Perla, MD (Cardiology) Ralene Bathe, MD as Consulting Physician (Ophthalmology) Future Appointments  Date Time Provider [place]  01/23/2018  8:20 AM Forrest Moron, MD PCP-PCP [facility]     Age-appropriate Screening Schedule: The list below includes current immunization status and future screening recommendations  based on patient's age. Orders for these recommended tests are listed in the plan section. The patient has been provided with a written plan. Immunization History  Administered Date(s) Administered  . Hepatitis A 12/03/2009  . Influenza,inj,Quad PF,6+ Mos 09/19/2013, 09/20/2014, 09/24/2015, 10/13/2016, 10/17/2017  . Pneumococcal Conjugate-13 09/24/2015  . Pneumococcal Polysaccharide-23 09/21/2003, 09/20/2014  . Tdap 07/22/2010  . Zoster 07/14/2011    Health Maintenance reviewed -  Flu vaccine and psa screening    Depression Screen-PHQ2/9 completed today  Depression screen [facility] 2/9 10/17/2017 10/13/2016 09/24/2015 09/20/2014  09/19/2013  Decreased Interest 0 0 0 0 0  Down, Depressed, Hopeless 0 0 0 - 0  PHQ - 2 Score 0 0 0 0 0       Depression Severity and Treatment Recommendations:  0-4= None  5-9= Mild / Treatment: Support, educate to call if worse; return in one month  10-14= Moderate / Treatment: Support, watchful waiting; Antidepressant or Psycotherapy  15-19= Moderately severe / Treatment: Antidepressant OR Psychotherapy  >= 20 = Major depression, severe / Antidepressant AND Psychotherapy  Functional Status Survey: Is the patient deaf or have difficulty hearing?: No Does the patient have difficulty seeing, even when wearing glasses/contacts?: No Does the patient have difficulty concentrating, remembering, or making decisions?: No Does the patient have difficulty walking or climbing stairs?: No Does the patient have difficulty dressing or bathing?: No Does the patient have difficulty doing errands alone such as visiting a doctor's office or shopping?: No  Hearing Evaluation: 1. Do you have trouble hearing the television when others do not? no 2. Do you have to strain to hear/understand conversations? no   Advanced Care Planning: 1. Patient has executed an Advance Directive: yes  2. If no, patient was given the opportunity to execute an Advance Directive today? n/a  3. Are the patient's advanced directives in [place]? no 4. This patient has the ability to prepare an Advance Directive: yes 5. Provider is willing to follow the patient's wishes: yes  Cognitive Assessment: Does the patient have evidence of cognitive impairment? no The patient does not have any evidence of any cognitive problems and denies any  change in mood/affect, appearance, speech, memory or motor skills.  Identification of Risk Factors: Risk factors include: hyperlipidemia  Review of Systems  Constitutional: Negative for chills and fever.  HENT: Negative for congestion, ear discharge, ear pain, nosebleeds, sore throat and  tinnitus.   Eyes: Negative for blurred vision and double vision.  Respiratory: Negative for cough, shortness of breath and wheezing.   Cardiovascular: Negative for chest pain, palpitations and orthopnea.  Gastrointestinal: Negative for abdominal pain, nausea and vomiting.  Skin: Negative for itching and rash.  Neurological: Negative for dizziness, tingling and headaches.  Psychiatric/Behavioral: Negative for depression. The patient is not nervous/anxious.   Back 4 years ago he took Cialis and had facial flushing, back pain, no headaches, no vision changes or runny nose, no palpitations He has difficulty with erections. Outside of cialis none of these symptoms.   Objective:   Vitals:   10/17/17 0820 10/17/17 0900  BP: (!) 150/82 140/72  Pulse: 99   Resp: 18   Temp: 98.9 F (37.2 C)   TempSrc: Oral   SpO2: 98%   Weight: 211 lb 6.4 oz (95.9 kg)   Height: 5' 10.47" (1.79 m)    BP Readings from Last 3 Encounters:  10/17/17 140/72  10/13/16 140/80  09/24/15 124/70    Body mass index is 29.93 kg/m.  Physical Exam  Constitutional: He  is oriented to person, place, and time. He appears well-developed and well-nourished.  HENT:  Head: Normocephalic and atraumatic.  Right Ear: External ear normal.  Left Ear: External ear normal.  Mouth/Throat: Oropharynx is clear and moist.  Eyes: Conjunctivae and EOM are normal.  Neck: Normal range of motion. No thyromegaly present.  Cardiovascular: Normal rate, regular rhythm and normal heart sounds.  No murmur heard. Pulmonary/Chest: Effort normal and breath sounds normal. No stridor. No respiratory distress. He has no wheezes.  Abdominal: Soft. Bowel sounds are normal. He exhibits no distension. There is no tenderness. There is no guarding.  Musculoskeletal: Normal range of motion. He exhibits no edema.  Neurological: He is alert and oriented to person, place, and time.  Skin: Skin is warm. Capillary refill takes less than 2 seconds. No  erythema.  Psychiatric: He has a normal mood and affect. His behavior is normal. Judgment and thought content normal.      Assessment/Plan:   Patient Self-Management and Personalized Health Advice The patient has been provided with information about: continue current medications  During the course of the visit the patient was educated and counseled about appropriate screening and preventive services including:   return annually or prn   Body mass index is 29.93 kg/m. Discussed the patient's BMI with him. The BMI BMI is not in the acceptable range; BMI management plan is completed  Jonathan Bridges was seen today for annual exam.  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  Screening for prostate cancer- started with blood testing Will do prostate exam at next visit -     PSA  Chronic rhinitis- advised pt to changed atrovent to flonase and add zyrtec -     fluticasone (FLONASE) 50 MCG/ACT nasal spray; Place 2 sprays daily into both nostrils.  Dependent edema- pt with bp elevation and polyuria Will treat for hctz Next visit will do prostates exam to evaluate for urinary symptoms  Will screen today for electrolyte disorder -     hydrochlorothiazide (HYDRODIURIL) 25 MG tablet; Take 0.5 tablets (12.5 mg total) every morning by mouth.      Return in about 3 months (around 01/17/2018) for nocturia .  Future Appointments  Date Time Provider [place]  01/23/2018  8:20 AM Forrest Moron, MD PCP-PCP [facility]      Patient Instructions     Today I prescribed sildenafil (The generic for viagra)  Common side effects are headache, runny nose, sustained erection (an emergency if the erection does not go down), and lightheadedness   Follow up to check your blood pressure  For your lower legs that are swelling up avoid salty foods, and canned meats  Wear compression stockings to wear to help reduce the ankle swelling   Follow up in 3 months to discuss increased urination and lower extremity edema  IF you received an x-ray today, you will receive an invoice from [facility]. Please contact [facility] at [phone] with questions or concerns regarding your invoice.   IF you received  labwork today, you will receive an invoice from [employer] [facility]. Please contact [facility] at [phone] with questions or concerns regarding your invoice.   Our billing staff will not be able to assist you with questions regarding bills from these companies.  You will be contacted with the lab results as soon as they are available. The fastest way to get your results is to activate your My Chart account. Instructions are located on the last page of this paperwork. If you have not heard from [place] regarding the results in 2 weeks, please contact this office.     Edema Edema is an abnormal buildup of fluids in your bodytissues. Edema is somewhatdependent on gravity to pull the fluid to the lowest place in your body. That makes the condition more common in the legs and thighs (lower extremities). Painless swelling of the feet and ankles is common and becomes more likely as you get older. It is also common in looser tissues, like around your eyes. When the affected area is squeezed, the fluid may move out of that spot and leave a dent for a few moments. This dent is called pitting. What are the causes? There are many possible causes of edema. Eating too much salt and being on your feet or sitting for a long  time can cause edema in your legs and ankles. Hot weather may make edema worse. Common medical causes of edema include:  Heart failure.  Liver disease.  Kidney disease.  Weak blood vessels in your legs.  Cancer.  An injury.  Pregnancy.  Some medications.  Obesity.  What are the signs or symptoms? Edema is usually painless.Your skin may look swollen or shiny. How is this diagnosed? Your health care provider may be able to diagnose edema by asking about your medical history and doing a physical exam. You may need to have tests such as X-rays, an electrocardiogram, or blood tests to check for medical conditions that may cause edema. How is this treated? Edema treatment depends on the cause. If you have heart, liver, or kidney disease, you need the treatment appropriate for these conditions. General treatment may include:  Elevation of the affected body part above the level of your heart.  Compression of the affected body part. Pressure from elastic bandages or support stockings squeezes the tissues and forces fluid back into the blood vessels. This keeps fluid from entering the tissues.  Restriction of fluid and salt intake.  Use of a water pill (diuretic). These medications are appropriate only for some types of edema. They pull fluid out of your body and make you urinate more often. This gets rid of fluid and reduces swelling, but diuretics can have side effects. Only use diuretics as directed by your health care provider.  Follow these instructions at home:  Keep the affected body part above the level of your heart when you are lying down.  Do not sit still or stand for prolonged periods.  Do not put anything directly under your knees when lying down.  Do not wear constricting clothing or garters on your upper legs.  Exercise your legs to work the fluid back into your blood vessels. This may help the swelling go down.  Wear elastic bandages or support stockings to  reduce ankle swelling as directed by your health care provider.  Eat a low-salt diet to reduce fluid if your health care provider recommends it.  Only take medicines as directed by your health care provider. Contact a health care provider if:  Your edema is not responding to treatment.  You have heart, liver, or kidney disease and notice symptoms of edema.  You have edema in your legs that does not improve after elevating them.  You have sudden and unexplained weight gain. Get help right away if:  You develop shortness of breath or chest pain.  You cannot breathe when you lie down.  You develop pain, redness, or warmth in the swollen areas.  You have heart, liver, or kidney disease and suddenly get edema.  You have a fever and your symptoms suddenly get worse. This information is not intended to replace advice given to you by your health care provider. Make sure you discuss any questions you have with your health care provider. Document Released: 11/29/2005 Document Revised: 05/06/2016 Document Reviewed: 09/21/2013 Elsevier Interactive Patient Education  2017 [employer].    An after visit summary with all of these plans was given to the patient.

## 2017-10-18 LAB — COMPREHENSIVE METABOLIC PANEL
ALK PHOS: 74 IU/L (ref 39–117)
ALT: 25 IU/L (ref 0–44)
AST: 26 IU/L (ref 0–40)
Albumin/Globulin Ratio: 1.8 (ref 1.2–2.2)
Albumin: 4.4 g/dL (ref 3.6–4.8)
BUN/Creatinine Ratio: 14 (ref 10–24)
BUN: 17 mg/dL (ref 8–27)
Bilirubin Total: 0.4 mg/dL (ref 0.0–1.2)
CHLORIDE: 99 mmol/L (ref 96–106)
CO2: 24 mmol/L (ref 20–29)
CREATININE: 1.22 mg/dL (ref 0.76–1.27)
Calcium: 9.6 mg/dL (ref 8.6–10.2)
GFR calc Af Amer: 69 mL/min/{1.73_m2} (ref >59)
GFR calc non Af Amer: 60 mL/min/{1.73_m2} (ref >59)
GLOBULIN, TOTAL: 2.5 g/dL (ref 1.5–4.5)
Glucose: 102 mg/dL — ABNORMAL HIGH (ref 65–99)
POTASSIUM: 4.8 mmol/L (ref 3.5–5.2)
SODIUM: 140 mmol/L (ref 134–144)
Total Protein: 6.9 g/dL (ref 6.0–8.5)

## 2017-10-18 LAB — LIPID PANEL
CHOLESTEROL TOTAL: 177 mg/dL (ref 100–199)
Chol/HDL Ratio: 3.5 ratio (ref 0.0–5.0)
HDL: 50 mg/dL (ref >39)
LDL CALC: 102 mg/dL — AB (ref 0–99)
Triglycerides: 125 mg/dL (ref 0–149)
VLDL CHOLESTEROL CAL: 25 mg/dL (ref 5–40)

## 2017-10-18 LAB — PSA: Prostate Specific Ag, Serum: 0.6 ng/mL (ref 0.0–4.0)

## 2017-11-24 ENCOUNTER — Other Ambulatory Visit: Payer: Self-pay | Admitting: Family Medicine

## 2018-01-23 ENCOUNTER — Other Ambulatory Visit: Payer: Self-pay

## 2018-01-23 ENCOUNTER — Other Ambulatory Visit: Payer: Self-pay | Admitting: Family Medicine

## 2018-01-23 ENCOUNTER — Encounter: Payer: Self-pay | Admitting: Family Medicine

## 2018-01-23 ENCOUNTER — Ambulatory Visit: Payer: Medicare Other | Admitting: Family Medicine

## 2018-01-23 VITALS — BP 138/72 | HR 92 | Temp 98.4°F | Resp 16 | Ht 70.47 in | Wt 211.8 lb

## 2018-01-23 DIAGNOSIS — R609 Edema, unspecified: Secondary | ICD-10-CM

## 2018-01-23 DIAGNOSIS — N401 Enlarged prostate with lower urinary tract symptoms: Secondary | ICD-10-CM | POA: Diagnosis not present

## 2018-01-23 DIAGNOSIS — R399 Unspecified symptoms and signs involving the genitourinary system: Secondary | ICD-10-CM

## 2018-01-23 DIAGNOSIS — N3943 Post-void dribbling: Secondary | ICD-10-CM

## 2018-01-23 DIAGNOSIS — R351 Nocturia: Secondary | ICD-10-CM | POA: Diagnosis not present

## 2018-01-23 LAB — POCT URINALYSIS DIP (MANUAL ENTRY)
BILIRUBIN UA: NEGATIVE
BILIRUBIN UA: NEGATIVE mg/dL
Blood, UA: NEGATIVE
Glucose, UA: NEGATIVE mg/dL
Leukocytes, UA: NEGATIVE
Nitrite, UA: NEGATIVE
PH UA: 6 (ref 5.0–8.0)
Protein Ur, POC: NEGATIVE mg/dL
Spec Grav, UA: 1.02 (ref 1.010–1.025)
Urobilinogen, UA: 0.2 E.U./dL

## 2018-01-23 MED ORDER — TAMSULOSIN HCL 0.4 MG PO CAPS: 0.4000 mg | ORAL_CAPSULE | Freq: Every day | ORAL | 1 refills | Status: DC

## 2018-01-23 NOTE — Patient Instructions (Addendum)
IF you received an x-ray today, you will receive an invoice from [facility]. Please contact [facility] Radiology at [phone] with questions or concerns regarding your invoice.   IF you received labwork today, you will receive an invoice from [place]. Please contact [facility] at [phone] with questions or concerns regarding your invoice.   Our billing staff will not be able to assist you with questions regarding bills from these companies.  You will be contacted with the lab results as soon as they are available. The fastest way to get your results is to activate your My Chart account. Instructions are located on the last page of this paperwork. If you have not heard from [place] regarding the results in 2 weeks, please contact this office.     Benign Prostatic Hyperplasia Benign prostatic hyperplasia (BPH) is an enlarged prostate gland that is caused by the normal aging process and not by cancer. The prostate is a walnut-sized gland that is involved in the production of semen. It is located in front of the rectum and below the bladder. The bladder stores urine and the urethra is the tube that carries the urine out of the body. The prostate may get bigger as a man gets older. An enlarged prostate can press on the urethra. This can make it harder to pass urine. The build-up of urine in the bladder can cause infection. Back pressure and infection may progress to bladder damage and kidney (renal) failure. What are the causes? This condition is part of a normal aging process. However, not all men develop problems from this condition. If the prostate enlarges away from the urethra, urine flow will not be blocked. If it enlarges toward the urethra and compresses it, there will be problems passing urine. What increases the risk? This condition is more likely to develop in men over the age of 60 years. What are the signs or symptoms? Symptoms of this condition include:  Getting up  often during the night to urinate.  Needing to urinate frequently during the day.  Difficulty starting urine flow.  Decrease in size and strength of your urine stream.  Leaking (dribbling) after urinating.  Inability to pass urine. This needs immediate treatment.  Inability to completely empty your bladder.  Pain when you pass urine. This is more common if there is also an infection.  Urinary tract infection (UTI).  How is this diagnosed? This condition is diagnosed based on your medical history, a physical exam, and your symptoms. Tests will also be done, such as:  A post-void bladder scan. This measures any amount of urine that may remain in your bladder after you finish urinating.  A digital rectal exam. In a rectal exam, your health care provider checks your prostate by putting a lubricated, gloved finger into your rectum to feel the back of your prostate gland. This exam detects the size of your gland and any abnormal lumps or growths.  An exam of your urine (urinalysis).  A prostate specific antigen (PSA) screening. This is a blood test used to screen for prostate cancer.  An ultrasound. This test uses sound waves to electronically produce a picture of your prostate gland.  Your health care provider may refer you to a specialist in kidney and prostate diseases (urologist). How is this treated? Once symptoms begin, your health care provider will monitor your condition (active surveillance or watchful waiting). Treatment for this condition will depend on the severity of your condition. Treatment may include:  Observation and  yearly exams. This may be the only treatment needed if your condition and symptoms are mild.  Medicines to relieve your symptoms, including: ? Medicines to shrink the prostate. ? Medicines to relax the muscle of the prostate.  Surgery in severe cases. Surgery may include: ? Prostatectomy. In this procedure, the prostate tissue is removed completely  through an open incision or with a laparascope or robotics. ? Transurethral resection of the prostate (TURP). In this procedure, a tool is inserted through the opening at the tip of the penis (urethra). It is used to cut away tissue of the inner core of the prostate. The pieces are removed through the same opening of the penis. This removes the blockage. ? Transurethral incision (TUIP). In this procedure, small cuts are made in the prostate. This lessens the prostate's pressure on the urethra. ? Transurethral microwave thermotherapy (TUMT). This procedure uses microwaves to create heat. The heat destroys and removes a small amount of prostate tissue. ? Transurethral needle ablation (TUNA). This procedure uses radio frequencies to destroy and remove a small amount of prostate tissue. ? Interstitial laser coagulation ([place]). This procedure uses a laser to destroy and remove a small amount of prostate tissue. ? Transurethral electrovaporization (TUVP). This procedure uses electrodes to destroy and remove a small amount of prostate tissue. ? Prostatic urethral lift. This procedure inserts an implant to push the lobes of the prostate away from the urethra.  Follow these instructions at home:  Take over-the-counter and prescription medicines only as told by your health care provider.  Monitor your symptoms for any changes. Contact your health care provider with any changes.  Avoid drinking large amounts of liquid before going to bed or out in public.  Avoid or reduce how much caffeine or alcohol you drink.  Give yourself time when you urinate.  Keep all follow-up visits as told by your health care provider. This is important. Contact a health care provider if:  You have unexplained back pain.  Your symptoms do not get better with treatment.  You develop side effects from the medicine you are taking.  Your urine becomes very dark or has a bad smell.  Your lower abdomen becomes distended and  you have trouble passing your urine. Get help right away if:  You have a fever or chills.  You suddenly cannot urinate.  You feel lightheaded, or very dizzy, or you faint.  There are large amounts of blood or clots in the urine.  Your urinary problems become hard to manage.  You develop moderate to severe low back or flank pain. The flank is the side of your body between the ribs and the hip. These symptoms may represent a serious problem that is an emergency. Do not wait to see if the symptoms will go away. Get medical help right away. Call your local emergency services (911 in the [place] Do not drive yourself to the hospital. Summary  Benign prostatic hyperplasia (BPH) is an enlarged prostate that is caused by the normal aging process and not by cancer.  An enlarged prostate can press on the urethra. This can make it hard to pass urine.  This condition is part of a normal aging process and is more likely to develop in men over the age of 86 years.  Get help right away if you suddenly cannot urinate. This information is not intended to replace advice given to you by your health care provider. Make sure you discuss any questions you have with your health care  provider. Document Released: 11/29/2005 Document Revised: 01/03/2017 Document Reviewed: 01/03/2017 Elsevier Interactive Patient Education  [employer].

## 2018-01-23 NOTE — Progress Notes (Signed)
Chief Complaint  Patient presents with  . Nocturia    pt is having some dizziness, going to br atleast 3 times a night and more if he drinks more at night    HPI  Pt reports that he has dizziness  He states that since starting the hctz he has some dizziness He states that he has a new mattress and box spring so the bed is really high He reports that he has been feeling a little off balance  He reports that he has improved lower extremity edema with the hctz He reports that when he urinates he can go then feels like he has to go again He states that he sometimes gets the urgent When he is urinating he has to sometimes wait a little to start the urine flow He reports that at the end of the urine stream he feels like he can still go He reports that he has some post void dribbling in his underwear   BP Readings from Last 3 Encounters:  01/23/18 138/72  10/17/17 140/72  10/13/16 140/80    4 review of systems  Past Medical History:  Diagnosis Date  . Anxiety   . Basal cell carcinoma   . Chronic rhinitis   . Depression   . Diverticulosis   . Elevated LFTs   . History of hypertension   . Hypercholesterolemia   . Sleep apnea     Current Outpatient Medications  Medication Sig Dispense Refill  . aspirin 81 MG tablet Take 81 mg by mouth daily.    . cholecalciferol (VITAMIN D) 1000 UNITS tablet Take 1,000 Units by mouth daily.    . fluticasone (FLONASE) 50 MCG/ACT nasal spray Place 2 sprays daily into both nostrils. 16 g 6  . hydrochlorothiazide (HYDRODIURIL) 25 MG tablet Take 0.5 tablets (12.5 mg total) every morning by mouth. 30 tablet 1  . Multiple Vitamin (MULTIVITAMIN) tablet Take 1 tablet by mouth daily.    . simvastatin (ZOCOR) 40 MG tablet TAKE 1 TABLET BY MOUTH DAILY 90 tablet 0  . thiamine (VITAMIN B-1) 100 MG tablet Take 100 mg daily by mouth.    . Pyridoxine HCl (VITAMIN B-6 PO) Take by mouth.    . sildenafil (VIAGRA) 25 MG tablet Take 1 tablet (25 mg total) daily  as needed by mouth for erectile dysfunction. (Patient not taking: Reported on 01/23/2018) 10 tablet 0  . tamsulosin (FLOMAX) 0.4 MG CAPS capsule Take 1 capsule (0.4 mg total) by mouth daily after breakfast. 30 capsule 1   No current facility-administered medications for this visit.     Allergies: No Known Allergies  Past Surgical History:  Procedure Laterality Date  . WRIST SURGERY  01/24/2003   Right- ORIF: Dr. Amedeo Plenty    Social History   Socioeconomic History  . Marital status: Married    Spouse name: None  . Number of children: None  . Years of education: None  . Highest education level: None  Social Needs  . Financial resource strain: None  . Food insecurity - worry: None  . Food insecurity - inability: None  . Transportation needs - medical: None  . Transportation needs - non-medical: None  Occupational History  . Occupation: Architectural technologist: [employer]  Tobacco Use  . Smoking status: Never Smoker  . Smokeless tobacco: Never Used  Substance and Sexual Activity  . Alcohol use: No  . Drug use: No  . Sexual activity: Yes  Other Topics Concern  . None  Social History Narrative   Exercise walking          Family History  Problem Relation Age of Onset  . Diabetes Father      ROS Review of Systems See HPI Constitution: No fevers or chills No malaise No diaphoresis Skin: No rash or itching Eyes: no blurry vision, no double vision GU: no dysuria or hematuria Neuro: no dizziness or headaches all others reviewed and negative   Objective: Vitals:   01/23/18 0836 01/23/18 0923  BP: (!) 166/97 138/72  Pulse: 92   Resp: 16   Temp: 98.4 F (36.9 C)   TempSrc: Oral   SpO2: 96%   Weight: 211 lb 12.8 oz (96.1 kg)   Height: 5' 10.47" (1.79 m)     Physical Exam  Constitutional: He is oriented to person, place, and time. He appears well-developed and well-nourished.  HENT:  Head: Normocephalic and atraumatic.  Cardiovascular: Normal rate,  regular rhythm and normal heart sounds.  Pulmonary/Chest: Effort normal and breath sounds normal. No stridor. No respiratory distress.  Neurological: He is alert and oriented to person, place, and time.  Skin: Skin is warm. Capillary refill takes less than 2 seconds.  Psychiatric: He has a normal mood and affect. His behavior is normal. Judgment and thought content normal.    Rectal exam Chaperone present Normal rectal tone Prostate exam without tenderness Mildly enlarged  UA Neg nitrites and LE No hematuria   Assessment and Plan [facility] Bridges was seen today for nocturia.  Diagnoses and all orders for this visit:  Lower urinary tract symptoms (LUTS)-  Nocturia probably aggravated by diuretic no UTI Will treat for BPH -     tamsulosin (FLOMAX) 0.4 MG CAPS capsule; Take 1 capsule (0.4 mg total) by mouth daily after breakfast. -     POCT urinalysis dipstick  Benign prostatic hyperplasia with post-void dribbling- would recommend flomax Reviewed risks and benefits Last PSA normal  -     tamsulosin (FLOMAX) 0.4 MG CAPS capsule; Take 1 capsule (0.4 mg total) by mouth daily after breakfast.  Dependent edema- improved, only take hctz as needed     [place]

## 2018-02-14 ENCOUNTER — Other Ambulatory Visit: Payer: Self-pay | Admitting: Family Medicine

## 2018-02-14 DIAGNOSIS — R609 Edema, unspecified: Secondary | ICD-10-CM

## 2018-02-20 ENCOUNTER — Ambulatory Visit (INDEPENDENT_AMBULATORY_CARE_PROVIDER_SITE_OTHER): Payer: Medicare Other | Admitting: Family Medicine

## 2018-02-20 ENCOUNTER — Encounter: Payer: Self-pay | Admitting: Family Medicine

## 2018-02-20 ENCOUNTER — Other Ambulatory Visit: Payer: Self-pay

## 2018-02-20 VITALS — BP 142/88 | HR 88 | Temp 98.3°F | Ht 71.47 in | Wt 214.0 lb

## 2018-02-20 DIAGNOSIS — N3943 Post-void dribbling: Secondary | ICD-10-CM | POA: Diagnosis not present

## 2018-02-20 DIAGNOSIS — N401 Enlarged prostate with lower urinary tract symptoms: Secondary | ICD-10-CM

## 2018-02-20 DIAGNOSIS — R609 Edema, unspecified: Secondary | ICD-10-CM | POA: Diagnosis not present

## 2018-02-20 DIAGNOSIS — R350 Frequency of micturition: Secondary | ICD-10-CM | POA: Diagnosis not present

## 2018-02-20 LAB — POCT URINALYSIS DIP (MANUAL ENTRY)
BILIRUBIN UA: NEGATIVE mg/dL
Bilirubin, UA: NEGATIVE
Blood, UA: NEGATIVE
Glucose, UA: NEGATIVE mg/dL
LEUKOCYTES UA: NEGATIVE
Nitrite, UA: NEGATIVE
Protein Ur, POC: NEGATIVE mg/dL
SPEC GRAV UA: 1.025 (ref 1.010–1.025)
Urobilinogen, UA: 0.2 E.U./dL
pH, UA: 5.5 (ref 5.0–8.0)

## 2018-02-20 NOTE — Patient Instructions (Addendum)
IF you received an x-ray today, you will receive an invoice from [facility]. Please contact [facility]iology at [phone] with questions or concerns regarding your invoice.   IF you received labwork today, you will receive an invoice from [place]. Please contact [facility] at [phone] with questions or concerns regarding your invoice.   Our billing staff will not be able to assist you with questions regarding bills from these companies.  You will be contacted with the lab results as soon as they are available. The fastest way to get your results is to activate your My Chart account. Instructions are located on the last page of this paperwork. If you have not heard from [place] regarding the results in 2 weeks, please contact this office.     Benign Prostatic Hyperplasia Benign prostatic hyperplasia (BPH) is an enlarged prostate gland that is caused by the normal aging process and not by cancer. The prostate is a walnut-sized gland that is involved in the production of semen. It is located in front of the rectum and below the bladder. The bladder stores urine and the urethra is the tube that carries the urine out of the body. The prostate may get bigger as a man gets older. An enlarged prostate can press on the urethra. This can make it harder to pass urine. The build-up of urine in the bladder can cause infection. Back pressure and infection may progress to bladder damage and kidney (renal) failure. What are the causes? This condition is part of a normal aging process. However, not all men develop problems from this condition. If the prostate enlarges away from the urethra, urine flow will not be blocked. If it enlarges toward the urethra and compresses it, there will be problems passing urine. What increases the risk? This condition is more likely to develop in men over the age of 8 years. What are the signs or symptoms? Symptoms of this condition include:  Getting up  often during the night to urinate.  Needing to urinate frequently during the day.  Difficulty starting urine flow.  Decrease in size and strength of your urine stream.  Leaking (dribbling) after urinating.  Inability to pass urine. This needs immediate treatment.  Inability to completely empty your bladder.  Pain when you pass urine. This is more common if there is also an infection.  Urinary tract infection (UTI).  How is this diagnosed? This condition is diagnosed based on your medical history, a physical exam, and your symptoms. Tests will also be done, such as:  A post-void bladder scan. This measures any amount of urine that may remain in your bladder after you finish urinating.  A digital rectal exam. In a rectal exam, your health care provider checks your prostate by putting a lubricated, gloved finger into your rectum to feel the back of your prostate gland. This exam detects the size of your gland and any abnormal lumps or growths.  An exam of your urine (urinalysis).  A prostate specific antigen (PSA) screening. This is a blood test used to screen for prostate cancer.  An ultrasound. This test uses sound waves to electronically produce a picture of your prostate gland.  Your health care provider may refer you to a specialist in kidney and prostate diseases (urologist). How is this treated? Once symptoms begin, your health care provider will monitor your condition (active surveillance or watchful waiting). Treatment for this condition will depend on the severity of your condition. Treatment may include:  Observation and  yearly exams. This may be the only treatment needed if your condition and symptoms are mild.  Medicines to relieve your symptoms, including: ? Medicines to shrink the prostate. ? Medicines to relax the muscle of the prostate.  Surgery in severe cases. Surgery may include: ? Prostatectomy. In this procedure, the prostate tissue is removed completely  through an open incision or with a laparascope or robotics. ? Transurethral resection of the prostate (TURP). In this procedure, a tool is inserted through the opening at the tip of the penis (urethra). It is used to cut away tissue of the inner core of the prostate. The pieces are removed through the same opening of the penis. This removes the blockage. ? Transurethral incision (TUIP). In this procedure, small cuts are made in the prostate. This lessens the prostate's pressure on the urethra. ? Transurethral microwave thermotherapy (TUMT). This procedure uses microwaves to create heat. The heat destroys and removes a small amount of prostate tissue. ? Transurethral needle ablation (TUNA). This procedure uses radio frequencies to destroy and remove a small amount of prostate tissue. ? Interstitial laser coagulation (Sandoval). This procedure uses a laser to destroy and remove a small amount of prostate tissue. ? Transurethral electrovaporization (TUVP). This procedure uses electrodes to destroy and remove a small amount of prostate tissue. ? Prostatic urethral lift. This procedure inserts an implant to push the lobes of the prostate away from the urethra.  Follow these instructions at home:  Take over-the-counter and prescription medicines only as told by your health care provider.  Monitor your symptoms for any changes. Contact your health care provider with any changes.  Avoid drinking large amounts of liquid before going to bed or out in public.  Avoid or reduce how much caffeine or alcohol you drink.  Give yourself time when you urinate.  Keep all follow-up visits as told by your health care provider. This is important. Contact a health care provider if:  You have unexplained back pain.  Your symptoms do not get better with treatment.  You develop side effects from the medicine you are taking.  Your urine becomes very dark or has a bad smell.  Your lower abdomen becomes distended and  you have trouble passing your urine. Get help right away if:  You have a fever or chills.  You suddenly cannot urinate.  You feel lightheaded, or very dizzy, or you faint.  There are large amounts of blood or clots in the urine.  Your urinary problems become hard to manage.  You develop moderate to severe low back or flank pain. The flank is the side of your body between the ribs and the hip. These symptoms may represent a serious problem that is an emergency. Do not wait to see if the symptoms will go away. Get medical help right away. Call your local emergency services (911 in the [place] Do not drive yourself to the hospital. Summary  Benign prostatic hyperplasia (BPH) is an enlarged prostate that is caused by the normal aging process and not by cancer.  An enlarged prostate can press on the urethra. This can make it hard to pass urine.  This condition is part of a normal aging process and is more likely to develop in men over the age of 64 years.  Get help right away if you suddenly cannot urinate. This information is not intended to replace advice given to you by your health care provider. Make sure you discuss any questions you have with your health care  provider. Document Released: 11/29/2005 Document Revised: 01/03/2017 Document Reviewed: 01/03/2017 Elsevier Interactive Patient Education  [employer].

## 2018-02-20 NOTE — Progress Notes (Signed)
Chief Complaint  Patient presents with  . bp check/urinary symptoms    HPI   BPH with post-void dribbling Flomax did not really improve symptoms very much to him.  He reports that his post void dribbling has reduced He is waking up to urinate 2 times per night He never sleeps through the night  Dependent Edema:  He went off the water pill and is only taking the hctz as needed He states that his ankles were swollen and stiff from swelling so he resumed the hctz daily He reports that he no longer notices the dizziness with the hctz  BP Readings from Last 3 Encounters:  02/20/18 (!) 142/88  01/23/18 138/72  10/17/17 140/72      Past Medical History:  Diagnosis Date  . Anxiety   . Basal cell carcinoma   . Chronic rhinitis   . Depression   . Diverticulosis   . Elevated LFTs   . History of hypertension   . Hypercholesterolemia   . Sleep apnea     Current Outpatient Medications  Medication Sig Dispense Refill  . aspirin 81 MG tablet Take 81 mg by mouth daily.    . cholecalciferol (VITAMIN D) 1000 UNITS tablet Take 1,000 Units by mouth daily.    . fluticasone (FLONASE) 50 MCG/ACT nasal spray Place 2 sprays daily into both nostrils. 16 g 6  . hydrochlorothiazide (HYDRODIURIL) 25 MG tablet TAKE 1/2 TABLET(12.5 MG) BY MOUTH EVERY MORNING 30 tablet 0  . Multiple Vitamin (MULTIVITAMIN) tablet Take 1 tablet by mouth daily.    . Pyridoxine HCl (VITAMIN B-6 PO) Take by mouth.    . simvastatin (ZOCOR) 40 MG tablet TAKE 1 TABLET BY MOUTH DAILY 90 tablet 0  . tamsulosin (FLOMAX) 0.4 MG CAPS capsule TAKE 1 CAPSULE(0.4 MG) BY MOUTH DAILY AFTER BREAKFAST 90 capsule 1  . thiamine (VITAMIN B-1) 100 MG tablet Take 100 mg daily by mouth.    . sildenafil (VIAGRA) 25 MG tablet Take 1 tablet (25 mg total) daily as needed by mouth for erectile dysfunction. (Patient not taking: Reported on 01/23/2018) 10 tablet 0   No current facility-administered medications for this visit.     Allergies:  No Known Allergies  Past Surgical History:  Procedure Laterality Date  . WRIST SURGERY  01/24/2003   Right- ORIF: Dr. Amedeo Plenty    Social History   Socioeconomic History  . Marital status: Married    Spouse name: None  . Number of children: None  . Years of education: None  . Highest education level: None  Social Needs  . Financial resource strain: None  . Food insecurity - worry: None  . Food insecurity - inability: None  . Transportation needs - medical: None  . Transportation needs - non-medical: None  Occupational History  . 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 Smoking status: Never Smoker  . Smokeless tobacco: Never Used  Substance and Sexual Activity  . Alcohol use: No  . Drug use: No  . Sexual activity: Yes  Other Topics Concern  . None  Social History Narrative   Exercise walking          Family History  Problem Relation Age of Onset  . Diabetes Father      ROS Review of Systems See HPI Constitution: No fevers or chills No malaise No diaphoresis Skin: No rash or itching Eyes: no blurry vision, no double vision GU: no dysuria or hematuria Neuro: no dizziness or headaches  all others reviewed and negative   Objective: Vitals:   02/20/18 0901  BP: (!) 142/88  Pulse: 88  Temp: 98.3 F (36.8 C)  TempSrc: Oral  SpO2: 99%  Weight: 214 lb (97.1 kg)  Height: 5' 11.47" (1.815 m)    Physical Exam  Constitutional: He is oriented to person, place, and time. He appears well-developed and well-nourished.  HENT:  Head: Normocephalic and atraumatic.  Eyes: Conjunctivae and EOM are normal.  Neck: Normal range of motion. No thyromegaly present.  Cardiovascular: Normal rate, regular rhythm and normal heart sounds.  No murmur heard. Pulmonary/Chest: Effort normal and breath sounds normal. No stridor. No respiratory distress.  Musculoskeletal: Normal range of motion. He exhibits no edema.  Neurological: He is alert and  oriented to person, place, and time.  Skin: Skin is warm. Capillary refill takes less than 2 seconds.  Psychiatric: He has a normal mood and affect. His behavior is normal. Judgment and thought content normal.       Component     Latest Ref Rng & Units 01/23/2018 01/23/2018 02/20/2018 02/20/2018         9:08 AM  9:08 AM  9:13 AM  9:13 AM  Color, UA     yellow yellow  yellow   Clarity, UA     clear clear  clear   Glucose     negative mg/dL negative  negative   Bilirubin, UA     negative negative negative negative negative  Specific Gravity, UA     1.010 - 1.025 1.020  1.025   RBC, UA     negative negative  negative   pH, UA     5.0 - 8.0 6.0  5.5   Protein, UA     negative mg/dL negative  negative   Urobilinogen, UA     0.2 or 1.0 E.U./dL 0.2  0.2   Nitrite, UA     Negative Negative  Negative   Leukocytes, UA     Negative Negative  Negative     Rectal exam 01/23/2018 Chaperone present Normal rectal tone Prostate exam without tenderness Mildly enlarged  UA Neg nitrites and LE No hematuria   Assessment and Plan Jonathan Bridges was seen today for bp check/urinary symptoms.  Diagnoses and all orders for this visit:  Frequency of micturition- no UTI noted, continue Hydration -     POCT urinalysis dipstick  Benign prostatic hyperplasia with post-void dribbling- without improvement with flomax will refer to Urology  -     Ambulatory referral to Urology  Dependent edema -    Continue hctz at 12.5mg   -    Avoid salty foods -    Compression stockings    [employer]

## 2018-02-26 ENCOUNTER — Other Ambulatory Visit: Payer: Self-pay | Admitting: Family Medicine

## 2018-04-17 ENCOUNTER — Other Ambulatory Visit: Payer: Self-pay | Admitting: Family Medicine

## 2018-04-17 DIAGNOSIS — R609 Edema, unspecified: Secondary | ICD-10-CM

## 2018-05-22 ENCOUNTER — Ambulatory Visit: Payer: Medicare Other | Admitting: Family Medicine

## 2018-05-22 ENCOUNTER — Encounter: Payer: Self-pay | Admitting: Family Medicine

## 2018-05-22 ENCOUNTER — Other Ambulatory Visit: Payer: Self-pay

## 2018-05-22 VITALS — BP 132/70 | HR 88 | Temp 99.0°F | Ht 69.29 in | Wt 211.8 lb

## 2018-05-22 DIAGNOSIS — R350 Frequency of micturition: Secondary | ICD-10-CM | POA: Diagnosis not present

## 2018-05-22 DIAGNOSIS — R609 Edema, unspecified: Secondary | ICD-10-CM | POA: Diagnosis not present

## 2018-05-22 LAB — POCT URINALYSIS DIP (MANUAL ENTRY)
Bilirubin, UA: NEGATIVE
Glucose, UA: NEGATIVE mg/dL
Ketones, POC UA: NEGATIVE mg/dL
Leukocytes, UA: NEGATIVE
NITRITE UA: NEGATIVE
PH UA: 5.5 (ref 5.0–8.0)
Protein Ur, POC: NEGATIVE mg/dL
RBC UA: NEGATIVE
SPEC GRAV UA: 1.02 (ref 1.010–1.025)
Urobilinogen, UA: 0.2 E.U./dL

## 2018-05-22 MED ORDER — HYDROCHLOROTHIAZIDE 25 MG PO TABS: 25.0000 mg | ORAL_TABLET | Freq: Every day | ORAL | 1 refills | Status: DC

## 2018-05-22 NOTE — Patient Instructions (Addendum)
     IF you received an x-ray today, you will receive an invoice from [facility]. Please contact [facility] Radiology at [phone] with questions or concerns regarding your invoice.   IF you received labwork today, you will receive an invoice from [place]. Please contact [facility] at [phone] with questions or concerns regarding your invoice.   Our billing staff will not be able to assist you with questions regarding bills from these companies.  You will be contacted with the lab results as soon as they are available. The fastest way to get your results is to activate your My Chart account. Instructions are located on the last page of this paperwork. If you have not heard from [place] regarding the results in 2 weeks, please contact this office.     Peripheral Edema Peripheral edema is swelling that is caused by a buildup of fluid. Peripheral edema most often affects the lower legs, ankles, and feet. It can also develop in the arms, hands, and face. The area of the body that has peripheral edema will look swollen. It may also feel heavy or warm. Your clothes may start to feel tight. Pressing on the area may make a temporary dent in your skin. You may not be able to move your arm or leg as much as usual. There are many causes of peripheral edema. It can be a complication of other diseases, such as congestive heart failure, kidney disease, or a problem with your blood circulation. It also can be a side effect of certain medicines. It often happens to women during pregnancy. Sometimes, the cause is not known. Treating the underlying condition is often the only treatment for peripheral edema. Follow these instructions at home: Pay attention to any changes in your symptoms. Take these actions to help with your discomfort:  Raise (elevate) your legs while you are sitting or lying down.  Move around often to prevent stiffness and to lessen swelling. Do not sit or stand for long periods of  time.  Wear support stockings as told by your health care provider.  Follow instructions from your health care provider about limiting salt (sodium) in your diet. Sometimes eating less salt can reduce swelling.  Take over-the-counter and prescription medicines only as told by your health care provider. Your health care provider may prescribe medicine to help your body get rid of excess water (diuretic).  Keep all follow-up visits as told by your health care provider. This is important.  Contact a health care provider if:  You have a fever.  Your edema starts suddenly or is getting worse, especially if you are pregnant or have a medical condition.  You have swelling in only one leg.  You have increased swelling and pain in your legs. Get help right away if:  You develop shortness of breath, especially when you are lying down.  You have pain in your chest or abdomen.  You feel weak.  You faint. This information is not intended to replace advice given to you by your health care provider. Make sure you discuss any questions you have with your health care provider. Document Released: 01/06/2005 Document Revised: 05/03/2016 Document Reviewed: 06/11/2015 Elsevier Interactive Patient Education  [employer].

## 2018-05-22 NOTE — Progress Notes (Signed)
Chief Complaint  Patient presents with  . Follow-up    enlarged prostate withpost void dripping    HPI   He went to Urology follow up for the presumed BPH Pt reports that he has the prostate of a 72yo He reports that he did not get much help from the flomax His swelling in his legs have improved with increasing the HCTZ to 50mg  He notices swelling if sitting for a long time his right leg gets much more swollen on the right  He states that he has some increased swelling with the sitting He has increased swelling in the leg on the right which is also noted to have lots of varicose veins  He reports that he gets up 2-3 times per night to urinate  The HCTZ increased the urination from 2 times a night to 3 times times a night He stops drinking water around 8pm then goes to bed at 10pm He drinks a cup of coffee in the morning.     Past Medical History:  Diagnosis Date  . Anxiety   . Basal cell carcinoma   . Chronic rhinitis   . Depression   . Diverticulosis   . Elevated LFTs   . History of hypertension   . Hypercholesterolemia   . Sleep apnea     Current Outpatient Medications  Medication Sig Dispense Refill  . aspirin 81 MG tablet Take 81 mg by mouth daily.    . cholecalciferol (VITAMIN D) 1000 UNITS tablet Take 1,000 Units by mouth daily.    . fluticasone (FLONASE) 50 MCG/ACT nasal spray Place 2 sprays daily into both nostrils. 16 g 6  . hydrochlorothiazide (HYDRODIURIL) 25 MG tablet Take 1 tablet (25 mg total) by mouth daily. 90 tablet 1  . Multiple Vitamin (MULTIVITAMIN) tablet Take 1 tablet by mouth daily.    . Pyridoxine HCl (VITAMIN B-6 PO) Take by mouth.    . simvastatin (ZOCOR) 40 MG tablet Take 1 tablet (40 mg total) by mouth daily. Office visit needed 90 tablet 0  . thiamine (VITAMIN B-1) 100 MG tablet Take 100 mg daily by mouth.    . sildenafil (VIAGRA) 25 MG tablet Take 1 tablet (25 mg total) daily as needed by mouth for erectile dysfunction. (Patient not  taking: Reported on 01/23/2018) 10 tablet 0   No current facility-administered medications for this visit.     Allergies: No Known Allergies  Past Surgical History:  Procedure Laterality Date  . WRIST SURGERY  01/24/2003   Right- ORIF: Dr. Amedeo Plenty    Social History   Socioeconomic History  . Marital status: Married    Spouse name: Not on file  . Number of children: Not on file  . Years of education: Not on file  . Highest education level: Not on file  Occupational History  . Occupation: Architectural technologist: [employer]  . Financial resource strain: Not on file  . Food insecurity:    Worry: Not on file    Inability: Not on file  . Transportation needs:    Medical: Not on file    Non-medical: Not on file  Tobacco Use  . Smoking status: Never Smoker  . Smokeless tobacco: Never Used  Substance and Sexual Activity  . Alcohol use: No  . Drug use: No  . Sexual activity: Yes  Lifestyle  . Physical activity:    Days per week: Not on file    Minutes per session: Not on file  .  Stress: Not on file  Relationships  . Social connections:    Talks on phone: Not on file    Gets together: Not on file    Attends religious service: Not on file    Active member of club or organization: Not on file    Attends meetings of clubs or organizations: Not on file    Relationship status: Not on file  Other Topics Concern  . Not on file  Social History Narrative   Exercise walking          Family History  Problem Relation Age of Onset  . Diabetes Father      ROS Review of Systems See HPI Constitution: No fevers or chills No malaise No diaphoresis Skin: No rash or itching Eyes: no blurry vision, no double vision GU: no dysuria or hematuria Neuro: no dizziness or headaches  all others reviewed and negative   Objective: Vitals:   05/22/18 0808  BP: 132/70  Pulse: 88  Temp: 99 F (37.2 C)  TempSrc: Oral  SpO2: 96%  Weight: 211 lb 12.8 oz  (96.1 kg)  Height: 5' 9.29" (1.76 m)   Wt Readings from Last 3 Encounters:  05/22/18 211 lb 12.8 oz (96.1 kg)  02/20/18 214 lb (97.1 kg)  01/23/18 211 lb 12.8 oz (96.1 kg)     Physical Exam  Physical Exam  Constitutional: he is oriented to person, place, and time. he appears well-developed and well-nourished.  HENT:  Head: Normocephalic and atraumatic.  Eyes: Conjunctivae and EOM are normal.  Cardiovascular: Normal rate, regular rhythm and normal heart sounds.   Pulmonary/Chest: Effort normal and breath sounds normal. No respiratory distress. She has no wheezes.  Extremities/Skin: right lower extremity with edema to the ankle 2+, pulses 2+ DP and PT, warm, no ulcers or pigmentation changes  Neurological: he is alert and oriented to person, place, and time.      Component     Latest Ref Rng & Units 05/22/2018 05/22/2018         8:15 AM  8:15 AM  Color, UA     yellow yellow   Clarity, UA     clear clear   Glucose     negative mg/dL negative   Bilirubin, UA     negative negative negative  Specific Gravity, UA     1.010 - 1.025 1.020   RBC, UA     negative negative   pH, UA     5.0 - 8.0 5.5   Protein, UA     negative mg/dL negative   Urobilinogen, UA     0.2 or 1.0 E.U./dL 0.2   Nitrite, UA     Negative Negative   Leukocytes, UA     Negative Negative     Assessment and Plan Jonathan Bridges was seen today for follow-up.  Diagnoses and all orders for this visit:  Dependent edema- advised to continue DASH diet and to keep legs elevated to cut down on the dependent edema -     hydrochlorothiazide (HYDRODIURIL) 25 MG tablet; Take 1 tablet (25 mg total) by mouth daily.  Frequent urination- no UTI, no BPH -     POCT urinalysis dipstick   Follow up in 3 months for bmp and check on edema  Jonathan Bridges A Nolon Rod

## 2018-06-05 ENCOUNTER — Other Ambulatory Visit: Payer: Self-pay | Admitting: Family Medicine

## 2018-06-06 ENCOUNTER — Other Ambulatory Visit: Payer: Self-pay | Admitting: Family Medicine

## 2018-06-06 NOTE — Telephone Encounter (Signed)
Refill request for simvastatin 40 mg #30 with no refills approved.  [place]

## 2018-06-27 ENCOUNTER — Other Ambulatory Visit: Payer: Self-pay | Admitting: Family Medicine

## 2018-06-28 NOTE — Telephone Encounter (Signed)
sinvastatin refill Last Refill:06/06/18 # 30  Last OV: 09/24/15 (upcoming appt 09/13/18) PCP: Dr Nolon Rod Pharmacy:[facility] [address]

## 2018-06-28 NOTE — Telephone Encounter (Signed)
Refill request for simvastatin 40 mg # 90 with no refills approved.  Pt has upcoming appt with stallings on 09/13/18.  Dgaddy, CMA

## 2018-07-15 ENCOUNTER — Other Ambulatory Visit: Payer: Self-pay

## 2018-07-15 ENCOUNTER — Emergency Department (HOSPITAL_COMMUNITY)
Admission: EM | Admit: 2018-07-15 | Discharge: 2018-07-16 | Disposition: A | Discharge status: Home / Self Care | Payer: Medicare Other | Attending: Emergency Medicine | Admitting: Emergency Medicine

## 2018-07-15 ENCOUNTER — Encounter (HOSPITAL_COMMUNITY): Payer: Self-pay | Admitting: *Deleted

## 2018-07-15 DIAGNOSIS — M79605 Pain in left leg: Secondary | ICD-10-CM | POA: Insufficient documentation

## 2018-07-15 DIAGNOSIS — I10 Essential (primary) hypertension: Secondary | ICD-10-CM | POA: Insufficient documentation

## 2018-07-15 DIAGNOSIS — Z85828 Personal history of other malignant neoplasm of skin: Secondary | ICD-10-CM | POA: Diagnosis not present

## 2018-07-15 DIAGNOSIS — Z79899 Other long term (current) drug therapy: Secondary | ICD-10-CM | POA: Insufficient documentation

## 2018-07-15 DIAGNOSIS — R2242 Localized swelling, mass and lump, left lower limb: Secondary | ICD-10-CM | POA: Insufficient documentation

## 2018-07-15 DIAGNOSIS — Z7982 Long term (current) use of aspirin: Secondary | ICD-10-CM | POA: Diagnosis not present

## 2018-07-15 DIAGNOSIS — M79604 Pain in right leg: Secondary | ICD-10-CM | POA: Diagnosis not present

## 2018-07-15 NOTE — ED Triage Notes (Signed)
The pt is c/o pain and two nodules in his rt lower extremity mid-tibia he reports that the leg feels  HOT  He denies any fever

## 2018-07-16 ENCOUNTER — Ambulatory Visit (HOSPITAL_BASED_OUTPATIENT_CLINIC_OR_DEPARTMENT_OTHER)
Admission: RE | Admit: 2018-07-16 | Discharge: 2018-07-16 | Disposition: A | Discharge status: Home / Self Care | Payer: Medicare Other | Source: Ambulatory Visit | Attending: Emergency Medicine | Admitting: Emergency Medicine

## 2018-07-16 DIAGNOSIS — R52 Pain, unspecified: Secondary | ICD-10-CM | POA: Insufficient documentation

## 2018-07-16 DIAGNOSIS — R609 Edema, unspecified: Secondary | ICD-10-CM | POA: Insufficient documentation

## 2018-07-16 DIAGNOSIS — M79609 Pain in unspecified limb: Secondary | ICD-10-CM

## 2018-07-16 DIAGNOSIS — M7989 Other specified soft tissue disorders: Secondary | ICD-10-CM | POA: Diagnosis not present

## 2018-07-16 LAB — I-STAT CHEM 8, ED
BUN: 19 mg/dL (ref 8–23)
Calcium, Ion: 1.11 mmol/L — ABNORMAL LOW (ref 1.15–1.40)
Chloride: 102 mmol/L (ref 98–111)
Creatinine, Ser: 1.2 mg/dL (ref 0.61–1.24)
Glucose, Bld: 106 mg/dL — ABNORMAL HIGH (ref 70–99)
HCT: 39 % (ref 39.0–52.0)
Hemoglobin: 13.3 g/dL (ref 13.0–17.0)
POTASSIUM: 3.4 mmol/L — AB (ref 3.5–5.1)
SODIUM: 140 mmol/L (ref 135–145)
TCO2: 25 mmol/L (ref 22–32)

## 2018-07-16 MED ORDER — ENOXAPARIN SODIUM 100 MG/ML ~~LOC~~ SOLN
1.0000 mg/kg | Freq: Once | SUBCUTANEOUS | Status: AC
  Administered 2018-07-16: 100 mg via SUBCUTANEOUS
  Filled 2018-07-16: qty 1

## 2018-07-16 MED ORDER — CEPHALEXIN 500 MG PO CAPS: 500.0000 mg | ORAL_CAPSULE | Freq: Three times a day (TID) | ORAL | 0 refills | Status: DC

## 2018-07-16 NOTE — Progress Notes (Signed)
VASCULAR LAB PRELIMINARY  PRELIMINARY  PRELIMINARY  PRELIMINARY  Right lower extremity venous duplex completed.    Preliminary report:  There is no DVT noted in the right lower extremity.  There is SVT noted in the greater saphenous vein from the mid calf to the distal thigh.  Non thrombosed varicosities noted throughout the calf.   Delisa Finck, RVT 07/16/2018, 10:13 AM

## 2018-07-16 NOTE — ED Provider Notes (Signed)
[facility] EMERGENCY DEPARTMENT Provider Note   CSN: [id number] Arrival date & time: 07/15/18  2244     History   Chief Complaint Chief Complaint  Patient presents with  . Leg Pain    HPI Jonathan Bridges is a 72 y.o. male.  HPI  This is a 72 year old male with a history of hypertension and hypercholesterolemia who presents with right leg pain.  Patient reports 2 days ago he felt a pop in his right medial calf.  Since that time he has noted pain that has descended downward.  He states "it feels like a pulled something."  He also has noted a knot there.  He has not taken anything for the pain.  Nothing seems to make it better or worse.  He has noted a little bit of discoloration and feels that his leg is a little more swollen.  He did recently travel from [place] on a long flight.  Denies any chest pain or shortness of breath.  Denies any history of blood clots.  Denies any fevers.  Past Medical History:  Diagnosis Date  . Anxiety   . Basal cell carcinoma   . Chronic rhinitis   . Depression   . Diverticulosis   . Elevated LFTs   . History of hypertension   . Hypercholesterolemia   . Sleep apnea     Patient Active Problem List   Diagnosis Date Noted  . ED (erectile dysfunction) 08/09/2012  . Sleep apnea   . Hypercholesterolemia   . Chronic rhinitis   . Hyperlipidemia 02/29/2012    Past Surgical History:  Procedure Laterality Date  . WRIST SURGERY  01/24/2003   Right- ORIF: Dr. Amedeo Plenty        Home Medications    Prior to Admission medications   Medication Sig Start Date End Date Taking? Authorizing Provider  aspirin 81 MG tablet Take 81 mg by mouth daily.    [provider]  cephALEXin (KEFLEX) 500 MG capsule Take 1 capsule (500 mg total) by mouth 3 (three) times daily. 07/16/18   Katerine Morua, Barbette Hair, MD  cholecalciferol (VITAMIN D) 1000 UNITS tablet Take 1,000 Units by mouth daily.    [provider]  fluticasone (FLONASE) 50  MCG/ACT nasal spray Place 2 sprays daily into both nostrils. 10/17/17   Forrest Moron, MD  hydrochlorothiazide (HYDRODIURIL) 25 MG tablet Take 1 tablet (25 mg total) by mouth daily. 05/22/18   Forrest Moron, MD  Multiple Vitamin (MULTIVITAMIN) tablet Take 1 tablet by mouth daily.    [provider]  Pyridoxine HCl (VITAMIN B-6 PO) Take by mouth.    [provider]  sildenafil (VIAGRA) 25 MG tablet Take 1 tablet (25 mg total) daily as needed by mouth for erectile dysfunction. Patient not taking: Reported on 01/23/2018 10/17/17   Forrest Moron, MD  simvastatin (ZOCOR) 40 MG tablet TAKE 1 TABLET BY MOUTH DAILY 06/06/18   Delia Chimes A, MD  simvastatin (ZOCOR) 40 MG tablet TAKE 1 TABLET BY MOUTH DAILY 06/28/18   Delia Chimes A, MD  thiamine (VITAMIN B-1) 100 MG tablet Take 100 mg daily by mouth.    [provider]    Family History Family History  Problem Relation Age of Onset  . Diabetes Father     Social History Social History   Tobacco Use  . Smoking status: Never Smoker  . Smokeless tobacco: Never Used  Substance Use Topics  . Alcohol use: No  . Drug use: No  Allergies   Patient has no known allergies.   Review of Systems Review of Systems  Constitutional: Negative for fever.  Respiratory: Negative for shortness of breath.   Cardiovascular: Positive for leg swelling. Negative for chest pain.  Skin: Positive for color change.  Neurological: Negative for weakness and numbness.  All other systems reviewed and are negative.    Physical Exam Updated Vital Signs BP (!) 147/106 (BP Location: Right Arm)   Pulse 70   Temp 98.9 F (37.2 C) (Oral)   Resp 14   Ht 5\' 11"  (1.803 m)   Wt 97.5 kg (215 lb)   SpO2 97%   BMI 29.99 kg/m   Physical Exam  Constitutional: He is oriented to person, place, and time. He appears well-developed and well-nourished. No distress.  HENT:  Head: Normocephalic and atraumatic.  Neck: Neck supple.    Cardiovascular: Normal rate, regular rhythm and normal heart sounds.  No murmur heard. Pulmonary/Chest: Effort normal and breath sounds normal. No respiratory distress. He has no wheezes.  Abdominal: Soft. Bowel sounds are normal. There is no tenderness. There is no rebound.  Musculoskeletal: He exhibits no edema.  Focused examination of the right leg reveals tenderness to palpation over the medial aspect of the calf, there is a palpable cord with some slight overlying erythema, no significant warmth, slight swelling noted asymmetric from the left, 2+ DP pulse, neurovascularly intact distally, flexion and extension at the ankle intact  Neurological: He is alert and oriented to person, place, and time.  Skin: Skin is warm and dry.  Psychiatric: He has a normal mood and affect.  Nursing note and vitals reviewed.    ED Treatments / Results  Labs (all labs ordered are listed, but only abnormal results are displayed) Labs Reviewed  I-STAT CHEM 8, ED - Abnormal; Notable for the following components:      Result Value   Potassium 3.4 (*)    Glucose, Bld 106 (*)    Calcium, Ion 1.11 (*)    All other components within normal limits    EKG None  Radiology No results found.  Procedures Procedures (including critical care time)  Medications Ordered in ED Medications  enoxaparin (LOVENOX) injection 100 mg (100 mg Subcutaneous Given 07/16/18 0337)     Initial Impression / Assessment and Plan / ED Course  I have reviewed the triage vital signs and the nursing notes.  Pertinent labs & imaging results that were available during my care of the patient were reviewed by me and considered in my medical decision making (see chart for details).     Patient presents with right leg pain.  Spontaneous.  No history of injury.  Is overall nontoxic-appearing and vital signs are reassuring.  He is afebrile.  Exam notable for palpable cord and some erythema.  Considerations include superficial  phlebitis, cellulitis, DVT is also consideration given recent travel.  He denies any chest pain or shortness of breath to suggest PE.  It is not particularly warm and he does not appear systemically ill.  Discussed with patient returning later today for an ultrasound.  He was given a dose of Lovenox.  He was also covered with Keflex for possible mild cellulitis.  Warm compresses to the leg.  After history, exam, and medical workup I feel the patient has been appropriately medically screened and is safe for discharge home. Pertinent diagnoses were discussed with the patient. Patient was given return precautions.  Final Clinical Impressions(s) / ED Diagnoses   Final diagnoses:  Right leg pain    ED Discharge Orders        Ordered    cephALEXin (KEFLEX) 500 MG capsule  3 times daily     07/16/18 0345    LE VENOUS     07/16/18 0345       Dyllin Gulley, Barbette Hair, MD 07/16/18 [id number]

## 2018-07-16 NOTE — Discharge Instructions (Addendum)
You were seen today for right leg swelling, pain.  You need to return tomorrow morning for evaluation for blood clot.  In the meantime you will be treated for a superficial skin infection.  You may use warm compresses for pain.  You will be given antibiotics.

## 2018-07-19 ENCOUNTER — Other Ambulatory Visit: Payer: Self-pay

## 2018-07-19 ENCOUNTER — Encounter: Payer: Self-pay | Admitting: Family Medicine

## 2018-07-19 ENCOUNTER — Ambulatory Visit (INDEPENDENT_AMBULATORY_CARE_PROVIDER_SITE_OTHER): Payer: Medicare Other | Admitting: Family Medicine

## 2018-07-19 VITALS — BP 136/70 | HR 91 | Temp 98.5°F | Resp 17 | Ht 71.0 in | Wt 208.6 lb

## 2018-07-19 DIAGNOSIS — I809 Phlebitis and thrombophlebitis of unspecified site: Secondary | ICD-10-CM

## 2018-07-19 DIAGNOSIS — I82811 Embolism and thrombosis of superficial veins of right lower extremities: Secondary | ICD-10-CM

## 2018-07-19 MED ORDER — ASPIRIN EC 325 MG PO TBEC: 325.0000 mg | DELAYED_RELEASE_TABLET | Freq: Every day | ORAL | 6 refills | Status: DC

## 2018-07-19 NOTE — Progress Notes (Signed)
Chief Complaint  Patient presents with  . Follow-up RIGHT LEG PAIN    sore and progression up leg.  Wants to know if what he is going through now is typical for the condition    HPI Pt called answering service over the weekend due to pain and swelling in the right leg He states that he would like to discuss his experience in the ER.  He had an ultrasound and was told he either had cellulitis or a DVT He was given lovenox and started on keflex He was diagnosed with a superficial venous thrombus He reports that he would like to know if he might go on to develop a DVT   Past Medical History:  Diagnosis Date  . Anxiety   . Basal cell carcinoma   . Chronic rhinitis   . Depression   . Diverticulosis   . Elevated LFTs   . History of hypertension   . Hypercholesterolemia   . Sleep apnea     Current Outpatient Medications  Medication Sig Dispense Refill  . cephALEXin (KEFLEX) 500 MG capsule Take 1 capsule (500 mg total) by mouth 3 (three) times daily. 21 capsule 0  . cholecalciferol (VITAMIN D) 1000 UNITS tablet Take 1,000 Units by mouth daily.    . fluticasone (FLONASE) 50 MCG/ACT nasal spray Place 2 sprays daily into both nostrils. 16 g 6  . hydrochlorothiazide (HYDRODIURIL) 25 MG tablet Take 1 tablet (25 mg total) by mouth daily. 90 tablet 1  . Multiple Vitamin (MULTIVITAMIN) tablet Take 1 tablet by mouth daily.    . Pyridoxine HCl (VITAMIN B-6 PO) Take by mouth.    . simvastatin (ZOCOR) 40 MG tablet TAKE 1 TABLET BY MOUTH DAILY 30 tablet 0  . simvastatin (ZOCOR) 40 MG tablet TAKE 1 TABLET BY MOUTH DAILY 90 tablet 0  . thiamine (VITAMIN B-1) 100 MG tablet Take 100 mg daily by mouth.    Marland Kitchen aspirin EC 325 MG tablet Take 1 tablet (325 mg total) by mouth daily. 30 tablet 6  . sildenafil (VIAGRA) 25 MG tablet Take 1 tablet (25 mg total) daily as needed by mouth for erectile dysfunction. (Patient not taking: Reported on 07/19/2018) 10 tablet 0   No current facility-administered  medications for this visit.     Allergies: No Known Allergies  Past Surgical History:  Procedure Laterality Date  . WRIST SURGERY  01/24/2003   Right- ORIF: Dr. Amedeo Plenty    Social History   Socioeconomic History  . Marital status: Married    Spouse name: Not on file  . Number of children: Not on file  . Years of education: Not on file  . Highest education level: Not on file  Occupational History  . 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 Financial resource strain: Not on file  . Food insecurity:    Worry: Not on file    Inability: Not on file  . Transportation needs:    Medical: Not on file    Non-medical: Not on file  Tobacco Use  . Smoking status: Never Smoker  . Smokeless tobacco: Never Used  Substance and Sexual Activity  . Alcohol use: No  . Drug use: No  . Sexual activity: Yes  Lifestyle  . Physical activity:    Days per week: Not on file    Minutes per session: Not on file  . Stress: Not on file  Relationships  . Social connections:    Talks on phone: Not  on file    Gets together: Not on file    Attends religious service: Not on file    Active member of club or organization: Not on file    Attends meetings of clubs or organizations: Not on file    Relationship status: Not on file  Other Topics Concern  . Not on file  Social History Narrative   Exercise walking          Family History  Problem Relation Age of Onset  . Diabetes Father      ROS Review of Systems See HPI Constitution: No fevers or chills No malaise No diaphoresis Skin: No rash or itching Eyes: no blurry vision, no double vision GU: no dysuria or hematuria Neuro: no dizziness or headaches all others reviewed and negative   Objective: Vitals:   07/19/18 1637  BP: 136/70  Pulse: 91  Resp: 17  Temp: 98.5 F (36.9 C)  TempSrc: Oral  SpO2: 96%  Weight: 208 lb 9.6 oz (94.6 kg)  Height: 5\' 11"  (1.803 m)    Physical Exam    Constitutional: He is oriented to person, place, and time. He appears well-developed and well-nourished.  HENT:  Head: Normocephalic and atraumatic.  Eyes: Conjunctivae and EOM are normal.  Cardiovascular: Normal rate, regular rhythm and normal heart sounds.  No murmur heard. Pulmonary/Chest: Effort normal and breath sounds normal. No stridor. No respiratory distress.  Musculoskeletal:       Legs: Neurological: He is alert and oriented to person, place, and time.      VASCULAR LAB PRELIMINARY  PRELIMINARY  PRELIMINARY  PRELIMINARY  Right lower extremity venous duplex completed.    Preliminary report:  There is no DVT noted in the right lower extremity.  There is SVT noted in the greater saphenous vein from the mid calf to the distal thigh.  Non thrombosed varicosities noted throughout the calf.   KANADY, CANDACE, RVT 07/16/2018, 10:13 AM   Assessment and Plan Star was seen today for follow-up right leg pain.  Diagnoses and all orders for this visit:  Chronic superficial venous thrombosis of lower extremity, right -     aspirin EC 325 MG tablet; Take 1 tablet (325 mg total) by mouth daily. -     Ambulatory referral to Vascular Surgery  Thrombophlebitis -     aspirin EC 325 MG tablet; Take 1 tablet (325 mg total) by mouth daily.  Advised pt to follow up with Vasular Surgery given his previously documented dependent edema due to venous insufficiency, his varicose veins and now his superficial VT and phlebitis Advised to completed keflex    Bear Osten A Nolon Rod

## 2018-07-19 NOTE — Patient Instructions (Addendum)
Continue elevation of the lower extremity Use compression stockings to help with lower leg swelling Continue antibiotics for now    IF you received an x-ray today, you will receive an invoice from [facility]. Please contact [facility] at [phone] with questions or concerns regarding your invoice.   IF you received labwork today, you will receive an invoice from [facility]. Please contact [facility] at [phone] with questions or concerns regarding your invoice.   Our billing staff will not be able to assist you with questions regarding bills from these companies.  You will be contacted with the lab results as soon as they are available. The fastest way to get your results is to activate your My Chart account. Instructions are located on the last page of this paperwork. If you have not heard from [place] regarding the results in 2 weeks, please contact this office.     Phlebitis Phlebitis is soreness and swelling (inflammation) of a vein. This can occur in your arms, legs, or torso (trunk), as well as deeper inside your body. Phlebitis is usually not serious when it occurs close to the surface of the body. However, it can cause serious problems when it occurs in a vein deeper inside the body. What are the causes? Phlebitis can be triggered by various things, including:  Reduced blood flow through your veins. This can happen with: ? Bed rest over a long period. ? Long-distance travel. ? Injury. ? Surgery. ? Being overweight (obese) or pregnant.  Having an IV tube put in the vein and getting certain medicines through the vein.  Cancer and cancer treatment.  Use of illegal drugs taken through the vein.  Inflammatory diseases.  Inherited (genetic) diseases that increase the risk of blood clots.  Hormone therapy, such as birth control pills.  What are the signs or symptoms?  Red, tender, swollen, and painful area on your skin. Usually, the area will be long and  narrow.  Firmness along the center of the affected area. This can indicate that a blood clot has formed.  Low-grade fever. How is this diagnosed? A health care provider can usually diagnose phlebitis by examining the affected area and asking about your symptoms. To check for infection or blood clots, your health care provider may order blood tests or an ultrasound exam of the area. Blood tests and your family history may also indicate if you have an underlying genetic disease that causes blood clots. Occasionally, a piece of tissue is taken from the body (biopsy sample) if an unusual cause of phlebitis is suspected. How is this treated? Treatment will vary depending on the severity of the condition and the area of the body affected. Treatment may include:  Use of a warm compress or heating pad.  Use of compression stockings or bandages.  Anti-inflammatory medicines.  Removal of any IV tube that may be causing the problem.  Medicines that kill germs (antibiotics) if an infection is present.  Blood-thinning medicines if a blood clot is suspected or present.  In rare cases, surgery may be needed to remove damaged sections of vein.  Follow these instructions at home:  Only take over-the-counter or prescription medicines as directed by your health care provider. Take all medicines exactly as prescribed.  Raise (elevate) the affected area above the level of your heart as directed by your health care provider.  Apply a warm compress or heating pad to the affected area as directed by your health care provider. Do not sleep with the heating pad.  Use compression stockings or bandages as directed. These will speed healing and prevent the condition from coming back.  If you are on blood thinners: ? Get follow-up blood tests as directed by your health care provider. ? Check with your health care provider before using any new medicines. ? Carry a medical alert card or wear your medical alert  jewelry to show that you are on blood thinners.  For phlebitis in the legs: ? Avoid prolonged standing or bed rest. ? Keep your legs moving. Raise your legs when sitting or lying.  Do not smoke.  Women, particularly those over the age of 51, should consider the risks and benefits of taking the contraceptive pill. This kind of hormone treatment can increase your risk for blood clots.  Follow up with your health care provider as directed. Contact a health care provider if:  You have unusual bruising or any bleeding problems.  Your swelling or pain in the affected area is not improving.  You are on anti-inflammatory medicine, and you develop belly (abdominal) pain. Get help right away if:  You have a sudden onset of chest pain or difficulty breathing.  You have a fever or persistent symptoms for more than 2-3 days.  You have a fever and your symptoms suddenly get worse. This information is not intended to replace advice given to you by your health care provider. Make sure you discuss any questions you have with your health care provider. Document Released: 11/23/2001 Document Revised: 05/06/2016 Document Reviewed: 08/06/2013 Elsevier Interactive Patient Education  2017 [employer].

## 2018-07-25 NOTE — Progress Notes (Signed)
Chief Complaint  Patient presents with  . Follow-up    HPI   Pt reports that he has less pain but continues edema of the right superficial vein He only has one more antibiotic to go He denies fevers He is taking aspirin 325mg  three times a day because "his wife told him to" He is also on simvastatin 40mg    He denies dizziness, he is tolerating the hctz very well  Past Medical History:  Diagnosis Date  . Anxiety   . Basal cell carcinoma   . Chronic rhinitis   . Depression   . Diverticulosis   . Elevated LFTs   . History of hypertension   . Hypercholesterolemia   . Sleep apnea     Current Outpatient Medications  Medication Sig Dispense Refill  . aspirin EC 325 MG tablet Take 1 tablet (325 mg total) by mouth daily. 30 tablet 6  . cephALEXin (KEFLEX) 500 MG capsule Take 1 capsule (500 mg total) by mouth 3 (three) times daily. 21 capsule 0  . cholecalciferol (VITAMIN D) 1000 UNITS tablet Take 1,000 Units by mouth daily.    . fluticasone (FLONASE) 50 MCG/ACT nasal spray Place 2 sprays daily into both nostrils. 16 g 6  . hydrochlorothiazide (HYDRODIURIL) 25 MG tablet Take 1 tablet (25 mg total) by mouth daily. 90 tablet 1  . Multiple Vitamin (MULTIVITAMIN) tablet Take 1 tablet by mouth daily.    . simvastatin (ZOCOR) 40 MG tablet TAKE 1 TABLET BY MOUTH DAILY 30 tablet 0  . thiamine (VITAMIN B-1) 100 MG tablet Take 100 mg daily by mouth.    . Pyridoxine HCl (VITAMIN B-6 PO) Take by mouth.    . sildenafil (VIAGRA) 25 MG tablet Take 1 tablet (25 mg total) daily as needed by mouth for erectile dysfunction. (Patient not taking: Reported on 07/19/2018) 10 tablet 0  . simvastatin (ZOCOR) 40 MG tablet TAKE 1 TABLET BY MOUTH DAILY (Patient not taking: Reported on 07/26/2018) 90 tablet 0   No current facility-administered medications for this visit.     Allergies: No Known Allergies  Past Surgical History:  Procedure Laterality Date  . WRIST SURGERY  01/24/2003   Right- ORIF: Dr.  Amedeo Plenty    Social History   Socioeconomic History  . Marital status: Married    Spouse name: Not on file  . Number of children: Not on file  . Years of education: Not on file  . Highest education level: Not on file  Occupational History  . Occupation: Architectural technologist: [employer]  . Financial resource strain: Not on file  . Food insecurity:    Worry: Not on file    Inability: Not on file  . Transportation needs:    Medical: Not on file    Non-medical: Not on file  Tobacco Use  . Smoking status: Never Smoker  . Smokeless tobacco: Never Used  Substance and Sexual Activity  . Alcohol use: No  . Drug use: No  . Sexual activity: Yes  Lifestyle  . Physical activity:    Days per week: Not on file    Minutes per session: Not on file  . Stress: Not on file  Relationships  . Social connections:    Talks on phone: Not on file    Gets together: Not on file    Attends religious service: Not on file    Active member of club or organization: Not on file    Attends meetings of clubs  or organizations: Not on file    Relationship status: Not on file  Other Topics Concern  . Not on file  Social History Narrative   Exercise walking          Family History  Problem Relation Age of Onset  . Diabetes Father      ROS Review of Systems See HPI Constitution: No fevers or chills No malaise No diaphoresis Skin: No rash or itching Eyes: no blurry vision, no double vision GU: no dysuria or hematuria Neuro: no dizziness or headaches * all others reviewed and negative   Objective: Vitals:   07/26/18 1155  BP: 132/70  Pulse: 87  Resp: 17  Temp: 98.4 F (36.9 C)  TempSrc: Oral  SpO2: 96%  Weight: 210 lb 12.8 oz (95.6 kg)  Height: 5\' 11"  (1.803 m)    Physical Exam  Constitutional: He is oriented to person, place, and time. He appears well-developed and well-nourished.  HENT:  Head: Normocephalic and atraumatic.  Eyes: Conjunctivae and EOM  are normal.  Cardiovascular: Normal rate, regular rhythm and normal heart sounds.  No murmur heard. Pulmonary/Chest: Effort normal and breath sounds normal. No stridor. No respiratory distress. He has no wheezes.  Musculoskeletal:       Right knee: He exhibits no swelling. No tenderness found.       Legs: Neurological: He is alert and oriented to person, place, and time.  Skin: Skin is warm. Capillary refill takes less than 2 seconds.  Psychiatric: He has a normal mood and affect. His behavior is normal. Judgment and thought content normal.     Assessment and Plan Jonathan Bridges was seen today for follow-up.  Diagnoses and all orders for this visit:  Thrombophlebitis Dependent edema Chronic superficial venous thrombosis of lower extremity, right  improved with keflex Taking asa 325 tid Advised to decrease to asa 325mg  once daily Continue hctz for lower extremity edema     Chloie Loney A Nolon Rod

## 2018-07-26 ENCOUNTER — Other Ambulatory Visit: Payer: Self-pay

## 2018-07-26 ENCOUNTER — Ambulatory Visit: Payer: Medicare Other | Admitting: Family Medicine

## 2018-07-26 ENCOUNTER — Encounter: Payer: Self-pay | Admitting: Family Medicine

## 2018-07-26 VITALS — BP 132/70 | HR 87 | Temp 98.4°F | Resp 17 | Ht 71.0 in | Wt 210.8 lb

## 2018-07-26 DIAGNOSIS — R609 Edema, unspecified: Secondary | ICD-10-CM | POA: Diagnosis not present

## 2018-07-26 DIAGNOSIS — I82811 Embolism and thrombosis of superficial veins of right lower extremities: Secondary | ICD-10-CM

## 2018-07-26 DIAGNOSIS — I809 Phlebitis and thrombophlebitis of unspecified site: Secondary | ICD-10-CM | POA: Diagnosis not present

## 2018-07-26 NOTE — Patient Instructions (Addendum)
I will contact you with your lab results within the next 2 weeks.  If you have not heard from [place] then please contact us. The fastest way to get your results is to register for My Chart.   IF you received an x-ray today, you will receive an invoice from [facility]. Please contact [facility] at [phone] with questions or concerns regarding your invoice.   IF you received labwork today, you will receive an invoice from [place]. Please contact [facility] at [phone] with questions or concerns regarding your invoice.   Our billing staff will not be able to assist you with questions regarding bills from these companies.  You will be contacted with the lab results as soon as they are available. The fastest way to get your results is to activate your My Chart account. Instructions are located on the last page of this paperwork. If you have not heard from [place] regarding the results in 2 weeks, please contact this office.     Venous Thromboembolism Prevention Venous thromboembolism (VTE) is a condition in which a blood clot (thrombus) develops in the body. A thrombus usually occurs in a deep vein in the leg or the pelvis (DVT), but it can also occur in the arm. Sometimes, pieces of a thrombus can break off from its original place of development and travel through the bloodstream to other parts of the body. When that happens, the thrombus is called an embolus. An embolus that travels to one or both lungs is called a pulmonary embolism. An embolism can block the blood flow in the blood vessels of other organs as well. VTE is a serious health condition that can cause disability or death. It is very important to get help right away and to not ignore symptoms. How can a VTE be prevented?  Exercise regularly. Take a brisk 30 minute walk every day. Staying active and moving around can help you to prevent blood clots.  Avoid sitting or lying in bed for long periods of time without  moving your legs. Change your position often, especially during long-distance travel (over 4 hours).  If you are a woman who is over 33 years of age, avoid unnecessary use of medicines that contain estrogen. These include birth control pills and hormone replacement therapy.  Do not smoke, especially if you take estrogen medicines. If you need help quitting, ask your health care provider.  Eat plenty of fruits and vegetables. Ask your health care provider or dietitian if there are foods that you should avoid.  Maintain a weight that is appropriate for your height. Ask your health care provider what weight is healthy for you.  Wear loose-fitting clothing. Avoid constrictive or tight clothing around your legs or waist.  Try not to bump or injure your legs. Avoid crossing your legs when you are sitting.  Do not use pillows under your knees while lying down unless told by your health care provider.  Wear support hose (compression stockings or TED hose) as told by your health care provider Compression stockings increase blood flow in your legs and can help prevent blood clots. Do not let them bunch up when you are wearing them. How can I prevent VTE when I travel? Long-distance travel (over 4 hours) can increase the risk of a VTE. To prevent VTE when traveling:  Exercise your legs every hour by standing, stretching, and bending and straightening your legs. If you are traveling by airplane, train, or bus, walk up and down  the aisle as often as possible to get your blood moving. If you are traveling by car, stop and get out of the car every hour to exercise your legs and stretch. Other types of exercise might include: ? Keeping your feet flat on the ground and raising your toes. ? Switching from tightening the muscles in your calves and thighs to relaxing those same muscles while you are sitting. ? Pointing and flexing your feet at the ankle joints while you are sitting.  Stay well hydrated while  traveling. Drink enough water to keep your urine clear or pale yellow.  Avoid drinking alcohol during long travel.  Generally, it is not recommended that you take medicines to prevent DVT during routine travel. How can VTE be prevented if I am hospitalized? A VTE may be prevented by taking medicines that are prescribed to prevent blood clots (anticoagulants). You can also help to prevent VTE while in the hospital by taking these actions:  Get out of bed and walk. Ask your health care provider if this is safe for you to do.  Request the use of a sequential compression device (SCD). This is a machine that pumps air into compression sleeves that are wrapped around your legs.  Request the use of compression stockings, which are tight, elastic stockings that apply pressure to the lower legs. Compression stockings are sometimes used with SCDs.  How can I prevent VTE after surgery? Understand that there is an increased risk for VTE for the first 4-6 weeks after surgery. During this time:  Avoid long-distance travel (over 4 hours). If you must travel during this time, ask your health care provider about additional preventive actions that you can take. These might include exercising your arms and legs every hour while you travel.  Avoid sitting or lying still for too long. If possible, get up and walk around one time every hour. Ask your health care provider when this is safe for you to do.  Get help right away if:  You have new or increased pain, swelling, or redness in an arm or leg.  You have numbness or tingling in an arm or leg.  You have shortness of breath while active or at rest.  You have chest pain.  You have a rapid or irregular heartbeat.  You feel light-headed or dizzy.  You cough up blood.  You notice blood in your vomit, bowel movement, or urine. These symptoms may represent a serious problem that is an emergency. Do not wait to see if the symptoms will go away. Get  medical help right away. Call your local emergency services (911 in the [place] Do not drive yourself to the hospital. This information is not intended to replace advice given to you by your health care provider. Make sure you discuss any questions you have with your health care provider. Document Released: 11/17/2009 Document Revised: 05/06/2016 Document Reviewed: 03/26/2015 Elsevier Interactive Patient Education  [employer].

## 2018-08-01 ENCOUNTER — Ambulatory Visit: Payer: Medicare Other | Admitting: Vascular Surgery

## 2018-08-01 ENCOUNTER — Other Ambulatory Visit: Payer: Self-pay

## 2018-08-01 ENCOUNTER — Encounter: Payer: Self-pay | Admitting: Vascular Surgery

## 2018-08-01 DIAGNOSIS — I809 Phlebitis and thrombophlebitis of unspecified site: Secondary | ICD-10-CM | POA: Insufficient documentation

## 2018-08-01 DIAGNOSIS — I8001 Phlebitis and thrombophlebitis of superficial vessels of right lower extremity: Secondary | ICD-10-CM | POA: Diagnosis not present

## 2018-08-01 NOTE — Progress Notes (Signed)
Patient name: Jonathan Bridges MRN: [id number] DOB: 01/16/1946 Sex: male  REASON FOR CONSULT: Superficial thrombo phlebitis right leg  HPI: Jonathan Bridges is a 72 y.o. male with no significant past medical history that presents for referral of superficial thrombophlebitis in his right great saphenous vein.  Patient recently traveled to [place] and noted some pain in his right medial calf and thigh proximally 2 weeks ago after flying home.  Ultimately he went to the ED and was evaluated with a venous duplex that showed no evidence of a DVT but he had thrombus in the superficial saphenous vein in his right leg (GSV).  Patient states the pain is largely resolved.  He has noted a cord that is palpable on his leg.  He also noted some mild leg swelling in his right leg.  Has never had any previous issues with his right leg.  Denies any trauma.  Denies any tobacco or alcohol abuse.  He states he was on antibiotics briefly by his primary care doctor that he is completed.  He was also on 325 mg aspirin daily. No family hx of blood clots.  Past Medical History:  Diagnosis Date  . Anxiety   . Basal cell carcinoma   . Chronic rhinitis   . Depression   . Diverticulosis   . Elevated LFTs   . History of hypertension   . Hypercholesterolemia   . Sleep apnea     Past Surgical History:  Procedure Laterality Date  . WRIST SURGERY  01/24/2003   Right- ORIF: Dr. Amedeo Bridges    Family History  Problem Relation Age of Onset  . Diabetes Father     SOCIAL HISTORY: Social History   Socioeconomic History  . Marital status: Married    Spouse name: Not on file  . Number of children: Not on file  . Years of education: Not on file  . Highest education level: Not on file  Occupational History  . Occupation: Architectural technologist: [employer]  . Financial resource strain: Not on file  . Food insecurity:    Worry: Not on file    Inability: Not on file  . Transportation needs:   Medical: Not on file    Non-medical: Not on file  Tobacco Use  . Smoking status: Never Smoker  . Smokeless tobacco: Never Used  Substance and Sexual Activity  . Alcohol use: No  . Drug use: No  . Sexual activity: Yes  Lifestyle  . Physical activity:    Days per week: Not on file    Minutes per session: Not on file  . Stress: Not on file  Relationships  . Social connections:    Talks on phone: Not on file    Gets together: Not on file    Attends religious service: Not on file    Active member of club or organization: Not on file    Attends meetings of clubs or organizations: Not on file    Relationship status: Not on file  . Intimate partner violence:    Fear of current or ex partner: Not on file    Emotionally abused: Not on file    Physically abused: Not on file    Forced sexual activity: Not on file  Other Topics Concern  . Not on file  Social History Narrative   Exercise walking          No Known Allergies  Current Outpatient Medications  Medication Sig Dispense  Refill  . aspirin EC 325 MG tablet Take 1 tablet (325 mg total) by mouth daily. 30 tablet 6  . cephALEXin (KEFLEX) 500 MG capsule Take 1 capsule (500 mg total) by mouth 3 (three) times daily. 21 capsule 0  . cholecalciferol (VITAMIN D) 1000 UNITS tablet Take 1,000 Units by mouth daily.    . fluticasone (FLONASE) 50 MCG/ACT nasal spray Place 2 sprays daily into both nostrils. 16 g 6  . hydrochlorothiazide (HYDRODIURIL) 25 MG tablet Take 1 tablet (25 mg total) by mouth daily. 90 tablet 1  . Multiple Vitamin (MULTIVITAMIN) tablet Take 1 tablet by mouth daily.    . Pyridoxine HCl (VITAMIN B-6 PO) Take by mouth.    . simvastatin (ZOCOR) 40 MG tablet TAKE 1 TABLET BY MOUTH DAILY 30 tablet 0  . thiamine (VITAMIN B-1) 100 MG tablet Take 100 mg daily by mouth.    . sildenafil (VIAGRA) 25 MG tablet Take 1 tablet (25 mg total) daily as needed by mouth for erectile dysfunction. (Patient not taking: Reported on 07/19/2018)  10 tablet 0   No current facility-administered medications for this visit.     REVIEW OF SYSTEMS:  [X]  denotes positive finding, [ ]  denotes negative finding Cardiac  Comments:  Chest pain or chest pressure:    Shortness of breath upon exertion:    Short of breath when lying flat:    Irregular heart rhythm:        Vascular    Pain in calf, thigh, or hip brought on by ambulation:    Pain in feet at night that wakes you up from your sleep:     Blood clot in your veins: x   Leg swelling:  x       Pulmonary    Oxygen at home:    Productive cough:     Wheezing:         Neurologic    Sudden weakness in arms or legs:     Sudden numbness in arms or legs:     Sudden onset of difficulty speaking or slurred speech:    Temporary loss of vision in one eye:     Problems with dizziness:         Gastrointestinal    Blood in stool:     Vomited blood:         Genitourinary    Burning when urinating:     Blood in urine:        Psychiatric    Major depression:         Hematologic    Bleeding problems:    Problems with blood clotting too easily:        Skin    Rashes or ulcers:        Constitutional    Fever or chills:      PHYSICAL EXAM: Vitals:   08/01/18 0941 08/01/18 0942  BP: (!) 141/82 140/84  Pulse: 82   Resp: 20   SpO2: 96%   Weight: 96.6 kg   Height: 5\' 11"  (1.803 m)     GENERAL: The patient is a well-nourished male, in no acute distress. The vital signs are documented above. CARDIAC: There is a regular rate and rhythm.  VASCULAR:  2+ palpable radial pulse bilateral upper extremities 2+ palpable femoral pulses bilateral groins 2+ palpable dorsalis pedis bilateral lower extremities Patient has a palpable cord on his right medial thigh down into the proximal calf with some mild overlying erythema While standing noticed one small  varicosity on the back of his right calf and some spider veins in his left leg PULMONARY: There is good air exchange bilaterally  without wheezing or rales. ABDOMEN: Soft and non-tender with normal pitched bowel sounds.  MUSCULOSKELETAL: There are no major deformities or cyanosis. NEUROLOGIC: No focal weakness or paresthesias are detected. SKIN: There are no ulcers or rashes noted. PSYCHIATRIC: The patient has a normal affect.  DATA:   I have independently reviewed his venous duplex and it shows no evidence of a deep venous thrombosis.  He does have superficial thrombophlebitis in the saphenous vein of his right leg that is in his upper calf and lower thigh.  Assessment/Plan:  72 year old male that presents for evaluation of acute superficial thrombophlebitis his right leg in the great saphenous vein.  I reviewed with Mr. Wenger typical conservative therapy for acute superficial thrombophlebitis.  We discussed leg elevation, as well as warm compresses, and also NSAIDs.  I recommend trying 400 mg ibuprofen 3 times a day until symptoms resolve after which he can stop.  He does not need any further antibiotics.  We also discussed that he could try compression hose for his right leg in the interim until his symptoms resolved.  He does not have any evidence of DVT on his duplex and I see no reason for anticoagulations especially since this superficial thrombosis is well away from the saphenofemoral junction.  This is his first episode and no role for any surgical intervention.    Marty Heck, MD [facility] Office: [phone] Pager: [phone]

## 2018-09-13 ENCOUNTER — Ambulatory Visit: Payer: Medicare Other | Admitting: Family Medicine

## 2018-09-13 ENCOUNTER — Encounter: Payer: Self-pay | Admitting: Family Medicine

## 2018-09-13 ENCOUNTER — Other Ambulatory Visit: Payer: Self-pay

## 2018-09-13 VITALS — BP 134/64 | HR 81 | Temp 98.4°F | Resp 17 | Ht 71.0 in | Wt 214.0 lb

## 2018-09-13 DIAGNOSIS — E782 Mixed hyperlipidemia: Secondary | ICD-10-CM | POA: Diagnosis not present

## 2018-09-13 DIAGNOSIS — I809 Phlebitis and thrombophlebitis of unspecified site: Secondary | ICD-10-CM | POA: Diagnosis not present

## 2018-09-13 DIAGNOSIS — Z23 Encounter for immunization: Secondary | ICD-10-CM | POA: Diagnosis not present

## 2018-09-13 NOTE — Progress Notes (Signed)
Chief Complaint  Patient presents with  . thrombophlebitis    3 month f/u    HPI  Patient reports that he still has some hardening of the saphenous vein and some discomfort but it is better He saw Vascular Surgery Dr. Carlis Abbott Ibuprofen 400mg  TID  He should also do compression stockings There was no evidence of DVT so no anticoagulation  Lab Results  Component Value Date   CREATININE 1.20 07/16/2018     Wt Readings from Last 3 Encounters:  09/13/18 214 lb (97.1 kg)  08/01/18 212 lb 14.4 oz (96.6 kg)  07/26/18 210 lb 12.8 oz (95.6 kg)    Dyslipidemia: Patient presents for evaluation of lipids.  Compliance with treatment thus far has been good.  A repeat fasting lipid profile was not done.  The patient does not use medications that may worsen dyslipidemias (corticosteroids, progestins, anabolic steroids, diuretics, beta-blockers, amiodarone, cyclosporine, olanzapine). The patient exercises intermittently.  The patient is not known to have coexisting coronary artery disease.   Lab Results  Component Value Date   CHOL 177 10/17/2017   CHOL 171 10/13/2016   CHOL 204 (H) 09/24/2015   Lab Results  Component Value Date   HDL 50 10/17/2017   HDL 49 10/13/2016   HDL 48 09/24/2015   Lab Results  Component Value Date   LDLCALC 102 (H) 10/17/2017   LDLCALC 101 10/13/2016   LDLCALC 131 (H) 09/24/2015   Lab Results  Component Value Date   TRIG 125 10/17/2017   TRIG 103 10/13/2016   TRIG 127 09/24/2015   Lab Results  Component Value Date   CHOLHDL 3.5 10/17/2017   CHOLHDL 3.5 10/13/2016   CHOLHDL 4.3 09/24/2015   No results found for: LDLDIRECT   Past Medical History:  Diagnosis Date  . Anxiety   . Basal cell carcinoma   . Chronic rhinitis   . Depression   . Diverticulosis   . Elevated LFTs   . History of hypertension   . Hypercholesterolemia   . Sleep apnea     Current Outpatient Medications  Medication Sig Dispense Refill  . aspirin EC 325 MG tablet Take  1 tablet (325 mg total) by mouth daily. 30 tablet 6  . cholecalciferol (VITAMIN D) 1000 UNITS tablet Take 1,000 Units by mouth daily.    . fluticasone (FLONASE) 50 MCG/ACT nasal spray Place 2 sprays daily into both nostrils. 16 g 6  . hydrochlorothiazide (HYDRODIURIL) 25 MG tablet Take 1 tablet (25 mg total) by mouth daily. 90 tablet 1  . Multiple Vitamin (MULTIVITAMIN) tablet Take 1 tablet by mouth daily.    . simvastatin (ZOCOR) 40 MG tablet TAKE 1 TABLET BY MOUTH DAILY 30 tablet 0  . thiamine (VITAMIN B-1) 100 MG tablet Take 100 mg daily by mouth.    . Pyridoxine HCl (VITAMIN B-6 PO) Take by mouth.    . sildenafil (VIAGRA) 25 MG tablet Take 1 tablet (25 mg total) daily as needed by mouth for erectile dysfunction. (Patient not taking: Reported on 07/19/2018) 10 tablet 0   No current facility-administered medications for this visit.     Allergies: No Known Allergies  Past Surgical History:  Procedure Laterality Date  . WRIST SURGERY  01/24/2003   Right- ORIF: Dr. Amedeo Plenty    Social History   Socioeconomic History  . Marital status: Married    Spouse name: Not on file  . Number of children: Not on file  . Years of education: Not on file  . Highest education level:  Not on file  Occupational History  . 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 Financial resource strain: Not on file  . Food insecurity:    Worry: Not on file    Inability: Not on file  . Transportation needs:    Medical: Not on file    Non-medical: Not on file  Tobacco Use  . Smoking status: Never Smoker  . Smokeless tobacco: Never Used  Substance and Sexual Activity  . Alcohol use: No  . Drug use: No  . Sexual activity: Yes  Lifestyle  . Physical activity:    Days per week: Not on file    Minutes per session: Not on file  . Stress: Not on file  Relationships  . Social connections:    Talks on phone: Not on file    Gets together: Not on file    Attends religious service:  Not on file    Active member of club or organization: Not on file    Attends meetings of clubs or organizations: Not on file    Relationship status: Not on file  Other Topics Concern  . Not on file  Social History Narrative   Exercise walking          Family History  Problem Relation Age of Onset  . Diabetes Father      ROS Review of Systems See HPI Constitution: No fevers or chills No malaise No diaphoresis Skin: No rash or itching Eyes: no blurry vision, no double vision GU: no dysuria or hematuria Neuro: no dizziness or headaches all others reviewed and negative   Objective: Vitals:   09/13/18 0814  BP: 134/64  Pulse: 81  Resp: 17  Temp: 98.4 F (36.9 C)  TempSrc: Oral  SpO2: 95%  Weight: 214 lb (97.1 kg)  Height: 5\' 11"  (1.803 m)    Physical Exam General: alert, oriented, in NAD Head: normocephalic, atraumatic, no sinus tenderness Eyes: EOM intact, no scleral icterus or conjunctival injection Nose: mucosa nonerythematous, nonedematous Throat: no pharyngeal exudate or erythema Lymph: no posterior auricular, submental or cervical lymph adenopathy Heart: normal rate, normal sinus rhythm, no murmurs Lungs: clear to auscultation bilaterally, no wheezing  Right great saphenous vein palpable and nontender   Assessment and Plan Jonathan Bridges was seen today for thrombophlebitis.  Diagnoses and all orders for this visit:  Mixed hyperlipidemia- stable, reviewed other labs Plan for recheck -     Comprehensive metabolic panel; Future -     Lipid panel; Future  Flu vaccine need -     Flu vaccine HIGH DOSE PF (Fluzone High dose)  Thrombophlebitis -  Reviewed conservative mgmt Compression stockings, nsaids, elevation     Zoe A Stallings

## 2018-09-14 LAB — COMPREHENSIVE METABOLIC PANEL
ALT: 31 IU/L (ref 0–44)
AST: 35 IU/L (ref 0–40)
Albumin/Globulin Ratio: 1.8 (ref 1.2–2.2)
Albumin: 4.5 g/dL (ref 3.5–4.8)
Alkaline Phosphatase: 74 IU/L (ref 39–117)
BUN/Creatinine Ratio: 16 (ref 10–24)
BUN: 17 mg/dL (ref 8–27)
Bilirubin Total: 0.9 mg/dL (ref 0.0–1.2)
CHLORIDE: 99 mmol/L (ref 96–106)
CO2: 24 mmol/L (ref 20–29)
CREATININE: 1.08 mg/dL (ref 0.76–1.27)
Calcium: 9.5 mg/dL (ref 8.6–10.2)
GFR, EST AFRICAN AMERICAN: 80 mL/min/{1.73_m2} (ref >59)
GFR, EST NON AFRICAN AMERICAN: 69 mL/min/{1.73_m2} (ref >59)
GLUCOSE: 87 mg/dL (ref 65–99)
Globulin, Total: 2.5 g/dL (ref 1.5–4.5)
Potassium: 4.8 mmol/L (ref 3.5–5.2)
Sodium: 139 mmol/L (ref 134–144)
TOTAL PROTEIN: 7 g/dL (ref 6.0–8.5)

## 2018-09-14 LAB — LIPID PANEL
CHOL/HDL RATIO: 4.3 ratio (ref 0.0–5.0)
Cholesterol, Total: 200 mg/dL — ABNORMAL HIGH (ref 100–199)
HDL: 47 mg/dL (ref >39)
LDL Calculated: 134 mg/dL — ABNORMAL HIGH (ref 0–99)
Triglycerides: 96 mg/dL (ref 0–149)
VLDL Cholesterol Cal: 19 mg/dL (ref 5–40)

## 2018-10-13 ENCOUNTER — Other Ambulatory Visit: Payer: Self-pay

## 2018-10-13 ENCOUNTER — Encounter: Payer: Self-pay | Admitting: Family Medicine

## 2018-10-13 ENCOUNTER — Ambulatory Visit: Payer: Medicare Other | Admitting: Family Medicine

## 2018-10-13 VITALS — BP 138/74 | HR 73 | Temp 98.7°F | Resp 16 | Ht 71.0 in | Wt 213.8 lb

## 2018-10-13 DIAGNOSIS — M7581 Other shoulder lesions, right shoulder: Secondary | ICD-10-CM | POA: Diagnosis not present

## 2018-10-13 MED ORDER — PREDNISONE 10 MG (21) PO TBPK: ORAL_TABLET | ORAL | 0 refills | Status: AC

## 2018-10-13 NOTE — Progress Notes (Signed)
Chief Complaint  Patient presents with  . right shoulder pain x 4 weeks    pain level 7/10.  Injured shoulder while lifting luggage while in [place] x 4 weeks ago, per pt lifted wife's luggage and felt a pop in right shoulder, felt like someone punched him in the shoulder.  Pt is taking tylenol arthritis for the pain and it helps some.    HPI   Right shoulder pain Pt reports 7/10 pain in the right shoulder  He feels like someone punched him in the joint of the shoulder with a resultant deep ache Onset was after lifting a suitcase off the carousel and overextended to pull the suitcase off He denies hand weakness He can move the shoulder to get dressed or to move things  Past Medical History:  Diagnosis Date  . Anxiety   . Basal cell carcinoma   . Chronic rhinitis   . Depression   . Diverticulosis   . Elevated LFTs   . History of hypertension   . Hypercholesterolemia   . Sleep apnea     Current Outpatient Medications  Medication Sig Dispense Refill  . acetaminophen (TYLENOL 8 HOUR ARTHRITIS PAIN) 650 MG CR tablet Take 650 mg by mouth every 8 (eight) hours as needed for pain.    Marland Kitchen aspirin EC 325 MG tablet Take 1 tablet (325 mg total) by mouth daily. 30 tablet 6  . cholecalciferol (VITAMIN D) 1000 UNITS tablet Take 1,000 Units by mouth daily.    . fluticasone (FLONASE) 50 MCG/ACT nasal spray Place 2 sprays daily into both nostrils. 16 g 6  . hydrochlorothiazide (HYDRODIURIL) 25 MG tablet Take 1 tablet (25 mg total) by mouth daily. 90 tablet 1  . Multiple Vitamin (MULTIVITAMIN) tablet Take 1 tablet by mouth daily.    . Pyridoxine HCl (VITAMIN B-6 PO) Take by mouth.    . sildenafil (VIAGRA) 25 MG tablet Take 1 tablet (25 mg total) daily as needed by mouth for erectile dysfunction. 10 tablet 0  . simvastatin (ZOCOR) 40 MG tablet TAKE 1 TABLET BY MOUTH DAILY 30 tablet 0  . thiamine (VITAMIN B-1) 100 MG tablet Take 100 mg daily by mouth.    . predniSONE (STERAPRED UNI-PAK 21 TAB) 10  MG (21) TBPK tablet Take 6 tablets (60 mg total) by mouth daily for 1 day, THEN 5 tablets (50 mg total) daily for 1 day, THEN 4 tablets (40 mg total) daily for 1 day, THEN 3 tablets (30 mg total) daily for 1 day, THEN 2 tablets (20 mg total) daily for 1 day, THEN 1 tablet (10 mg total) daily for 1 day. 21 tablet 0   No current facility-administered medications for this visit.     Allergies: No Known Allergies  Past Surgical History:  Procedure Laterality Date  . WRIST SURGERY  01/24/2003   Right- ORIF: Dr. Amedeo Plenty    Social History   Socioeconomic History  . Marital status: Married    Spouse name: Not on file  . Number of children: Not on file  . Years of education: Not on file  . Highest education level: Not on file  Occupational History  . Occupation: Architectural technologist: [employer]  . Financial resource strain: Not on file  . Food insecurity:    Worry: Not on file    Inability: Not on file  . Transportation needs:    Medical: Not on file    Non-medical: Not on file  Tobacco Use  .  Smoking status: Never Smoker  . Smokeless tobacco: Never Used  Substance and Sexual Activity  . Alcohol use: No  . Drug use: No  . Sexual activity: Yes  Lifestyle  . Physical activity:    Days per week: Not on file    Minutes per session: Not on file  . Stress: Not on file  Relationships  . Social connections:    Talks on phone: Not on file    Gets together: Not on file    Attends religious service: Not on file    Active member of club or organization: Not on file    Attends meetings of clubs or organizations: Not on file    Relationship status: Not on file  Other Topics Concern  . Not on file  Social History Narrative   Exercise walking          Family History  Problem Relation Age of Onset  . Diabetes Father      ROS Review of Systems See HPI Constitution: No fevers or chills No malaise No diaphoresis Skin: No rash or itching Eyes: no  blurry vision, no double vision GU: no dysuria or hematuria Neuro: no dizziness or headaches all others reviewed and negative   Objective: Vitals:   10/13/18 0956 10/13/18 1003  BP: (!) 169/95 138/74  Pulse: 73   Resp: 16   Temp: 98.7 F (37.1 C)   TempSrc: Oral   SpO2: 97%   Weight: 213 lb 12.8 oz (97 kg)   Height: 5\' 11"  (1.803 m)     Physical Exam  Constitutional: He is oriented to person, place, and time. He appears well-developed and well-nourished.  HENT:  Head: Normocephalic and atraumatic.  Cardiovascular: Normal rate, regular rhythm and normal heart sounds.  No murmur heard. Pulmonary/Chest: Effort normal and breath sounds normal. No stridor. No respiratory distress. He has no wheezes.  Neurological: He is alert and oriented to person, place, and time.   With pronation there is pain in the [facility] joint With internal rotation and external rotation there is no crepitus or weakness    Assessment and Plan Jonathan Bridges was seen today for right shoulder pain x 4 weeks.  Diagnoses and all orders for this visit:  Rotator cuff tendinitis, right -     predniSONE (STERAPRED UNI-PAK 21 TAB) 10 MG (21) TBPK tablet; Take 6 tablets (60 mg total) by mouth daily for 1 day, THEN 5 tablets (50 mg total) daily for 1 day, THEN 4 tablets (40 mg total) daily for 1 day, THEN 3 tablets (30 mg total) daily for 1 day, THEN 2 tablets (20 mg total) daily for 1 day, THEN 1 tablet (10 mg total) daily for 1 day.   Advised prednisone for tendinitis Discussed side effects of prednisone, pt verbalized understanding Discussed ice and stretching As well as tylenol arthritis   Pt has follow up in one week   [place]

## 2018-10-13 NOTE — Patient Instructions (Addendum)
If you have lab work done today you will be contacted with your lab results within the next 2 weeks.  If you have not heard from [place] then please contact us. The fastest way to get your results is to register for My Chart.   IF you received an x-ray today, you will receive an invoice from [facility]. Please contact [facility] at [phone] with questions or concerns regarding your invoice.   IF you received labwork today, you will receive an invoice from [facility]. Please contact [facility] at [phone] with questions or concerns regarding your invoice.   Our billing staff will not be able to assist you with questions regarding bills from these companies.  You will be contacted with the lab results as soon as they are available. The fastest way to get your results is to activate your My Chart account. Instructions are located on the last page of this paperwork. If you have not heard from [place] regarding the results in 2 weeks, please contact this office.     Rotator Cuff Tendinitis Rotator cuff tendinitis is inflammation of the tough, cord-like bands that connect muscle to bone (tendons) in the rotator cuff. The rotator cuff includes all of the muscles and tendons that connect the arm to the shoulder. The rotator cuff holds the head of the upper arm bone (humerus) in the cup (fossa) of the shoulder blade (scapula). This condition can lead to a long-lasting (chronic) tear. The tear may be partial or complete. What are the causes? This condition is usually caused by overusing the rotator cuff. What increases the risk? This condition is more likely to develop in athletes and workers who frequently use their shoulder or reach over their heads. This can include activities such as:  Tennis.  Baseball or softball.  Swimming.  Construction work.  Painting.  What are the signs or symptoms? Symptoms of this condition include:  Pain spreading (radiating) from the  shoulder to the upper arm.  Swelling and tenderness in front of the shoulder.  Pain when reaching, pulling, or lifting the arm above the head.  Pain when lowering the arm from above the head.  Minor pain in the shoulder when resting.  Increased pain in the shoulder at night.  Difficulty placing the arm behind the back.  How is this diagnosed? This condition is diagnosed with a medical history and physical exam. Tests may also be done, including:  X-rays.  MRI.  Ultrasounds.  CT or MR arthrogram. During this test, a contrast material is injected and then images are taken.  How is this treated? Treatment for this condition depends on the severity of the condition. In less severe cases, treatment may include:  Rest. This may be done with a sling that holds the shoulder still (immobilization). Your health care provider may also recommend avoiding activities that involve lifting your arm over your head.  Icing the shoulder.  Anti-inflammatory medicines, such as aspirin or ibuprofen.  In more severe cases, treatment may include:  Physical therapy.  Steroid injections.  Surgery.  Follow these instructions at home: If you have a sling:  Wear the sling as told by your health care provider. Remove it only as told by your health care provider.  Loosen the sling if your fingers tingle, become numb, or turn cold and blue.  Keep the sling clean.  If the sling is not waterproof, do not let it get wet. Remove it, if allowed, or cover it with a watertight  covering when you take a bath or shower. Managing pain, stiffness, and swelling  If directed, put ice on the injured area. ? If you have a removable sling, remove it as told by your health care provider. ? Put ice in a plastic bag. ? Place a towel between your skin and the bag. ? Leave the ice on for 20 minutes, 2-3 times a day.  Move your fingers often to avoid stiffness and to lessen swelling.  Raise (elevate) the  injured area above the level of your heart while you are lying down.  Find a comfortable sleeping position or sleep on a recliner, if available. Driving  Do not drive or use heavy machinery while taking prescription pain medicine.  Ask your health care provider when it is safe to drive if you have a sling on your arm. Activity  Rest your shoulder as told by your health care provider.  Return to your normal activities as told by your health care provider. Ask your health care provider what activities are safe for you.  Do any exercises or stretches as told by your health care provider.  If you do repetitive overhead tasks, take small breaks in between and include stretching exercises as told by your health care provider. General instructions  Do not use any products that contain nicotine or tobacco, such as cigarettes and e-cigarettes. These can delay healing. If you need help quitting, ask your health care provider.  Take over-the-counter and prescription medicines only as told by your health care provider.  Keep all follow-up visits as told by your health care provider. This is important. Contact a health care provider if:  Your pain gets worse.  You have new pain in your arm, hands, or fingers.  Your pain is not relieved with medicine or does not get better after 6 weeks of treatment.  You have cracking sensations when moving your shoulder in certain directions.  You hear a snapping sound after using your shoulder, followed by severe pain and weakness. Get help right away if:  Your arm, hand, or fingers are numb or tingling.  Your arm, hand, or fingers are swollen or painful or they turn white or blue. Summary  Rotator cuff tendinitis is inflammation of the tough, cord-like bands that connect muscle to bone (tendons) in the rotator cuff.  This condition is usually caused by overusing the rotator cuff, which includes all of the muscles and tendons that connect the arm to  the shoulder.  This condition is more likely to develop in athletes and workers who frequently use their shoulder or reach over their heads.  Treatment generally includes rest, anti-inflammatory medicines, and icing. In some cases, physical therapy and steroid injections may be needed. In severe cases, surgery may be needed. This information is not intended to replace advice given to you by your health care provider. Make sure you discuss any questions you have with your health care provider. Document Released: 02/19/2004 Document Revised: 11/15/2016 Document Reviewed: 11/15/2016 Elsevier Interactive Patient Education  2017 [employer].

## 2018-10-18 ENCOUNTER — Ambulatory Visit: Payer: Medicare Other | Admitting: Family Medicine

## 2018-10-18 ENCOUNTER — Encounter: Payer: Self-pay | Admitting: Family Medicine

## 2018-10-18 ENCOUNTER — Other Ambulatory Visit: Payer: Self-pay

## 2018-10-18 VITALS — BP 138/80 | HR 80 | Temp 98.2°F | Resp 16 | Ht 69.0 in | Wt 208.2 lb

## 2018-10-18 DIAGNOSIS — Z0000 Encounter for general adult medical examination without abnormal findings: Secondary | ICD-10-CM

## 2018-10-18 DIAGNOSIS — M7581 Other shoulder lesions, right shoulder: Secondary | ICD-10-CM

## 2018-10-18 DIAGNOSIS — E782 Mixed hyperlipidemia: Secondary | ICD-10-CM

## 2018-10-18 MED ORDER — ZOSTER VAC RECOMB ADJUVANTED 50 MCG/0.5ML IM SUSR: 0.5000 mL | Freq: Once | INTRAMUSCULAR | 0 refills | Status: AC

## 2018-10-18 MED ORDER — PRAVASTATIN SODIUM 20 MG PO TABS: 20.0000 mg | ORAL_TABLET | Freq: Every day | ORAL | 3 refills | Status: DC

## 2018-10-18 NOTE — Progress Notes (Signed)
QUICK REFERENCE INFORMATION: The ABCs of Providing the Annual Wellness Visit  CMS.gov Medicare Learning Network  [employer] Annual Wellness Visit  Subjective:   Jonathan Bridges is a 72 y.o. Male who presents for an Annual Wellness Visit.   Patient Active Problem List   Diagnosis Date Noted  . Superficial thrombophlebitis 08/01/2018  . ED (erectile dysfunction) 08/09/2012  . Sleep apnea   . Hypercholesterolemia   . Chronic rhinitis   . Hyperlipidemia 02/29/2012    Past Medical History:  Diagnosis Date  . Anxiety   . Basal cell carcinoma   . Chronic rhinitis   . Depression   . Diverticulosis   . Elevated LFTs   . History of hypertension   . Hypercholesterolemia   . Sleep apnea      Past Surgical History:  Procedure Laterality Date  . WRIST SURGERY  01/24/2003   Right- ORIF: Dr. Amedeo Plenty     Outpatient Medications Prior to Visit  Medication Sig Dispense Refill  . acetaminophen (TYLENOL 8 HOUR ARTHRITIS PAIN) 650 MG CR tablet Take 650 mg by mouth every 8 (eight) hours as needed for pain.    Marland Kitchen aspirin EC 325 MG tablet Take 1 tablet (325 mg total) by mouth daily. 30 tablet 6  . cholecalciferol (VITAMIN D) 1000 UNITS tablet Take 1,000 Units by mouth daily.    . fluticasone (FLONASE) 50 MCG/ACT nasal spray Place 2 sprays daily into both nostrils. 16 g 6  . hydrochlorothiazide (HYDRODIURIL) 25 MG tablet Take 1 tablet (25 mg total) by mouth daily. 90 tablet 1  . Multiple Vitamin (MULTIVITAMIN) tablet Take 1 tablet by mouth daily.    . predniSONE (STERAPRED UNI-PAK 21 TAB) 10 MG (21) TBPK tablet Take 6 tablets (60 mg total) by mouth daily for 1 day, THEN 5 tablets (50 mg total) daily for 1 day, THEN 4 tablets (40 mg total) daily for 1 day, THEN 3 tablets (30 mg total) daily for 1 day, THEN 2 tablets (20 mg total) daily for 1 day, THEN 1 tablet (10 mg total) daily for 1 day. 21 tablet 0  . Pyridoxine HCl (VITAMIN B-6 PO) Take by mouth.    . simvastatin (ZOCOR) 40 MG tablet TAKE 1  TABLET BY MOUTH DAILY 30 tablet 0  . thiamine (VITAMIN B-1) 100 MG tablet Take 100 mg daily by mouth.    . sildenafil (VIAGRA) 25 MG tablet Take 1 tablet (25 mg total) daily as needed by mouth for erectile dysfunction. (Patient not taking: Reported on 10/18/2018) 10 tablet 0   No facility-administered medications prior to visit.     Not on File   Family History  Problem Relation Age of Onset  . Diabetes Father      Social History   Socioeconomic History  . Marital status: Married    Spouse name: Not on file  . Number of children: Not on file  . Years of education: Not on file  . Highest education level: Not on file  Occupational History  . Occupation: Architectural technologist: [employer]  . Financial resource strain: Not on file  . Food insecurity:    Worry: Not on file    Inability: Not on file  . Transportation needs:    Medical: Not on file    Non-medical: Not on file  Tobacco Use  . Smoking status: Never Smoker  . Smokeless tobacco: Never Used  Substance and Sexual Activity  . Alcohol use: No  . Drug use:  No  . Sexual activity: Yes  Lifestyle  . Physical activity:    Days per week: Not on file    Minutes per session: Not on file  . Stress: Not on file  Relationships  . Social connections:    Talks on phone: Not on file    Gets together: Not on file    Attends religious service: Not on file    Active member of club or organization: Not on file    Attends meetings of clubs or organizations: Not on file    Relationship status: Not on file  Other Topics Concern  . Not on file  Social History Narrative   Exercise walking            Recent Hospitalizations? No  Health Habits: Current exercise activities include: walking Exercise: 4 times/week. Diet: in general, a "healthy" diet    Alcohol intake: none  Health Risk Assessment: The patient has completed a Health Risk Assessment. This has been reveiwed with them and has been  scanned into the [place] system as an attached document.  Current Medical Providers and Suppliers: [facility] Patient Care Team: Forrest Moron, MD as PCP - General (Internal Medicine) Irene Shipper, MD (Gastroenterology) Roseanne Kaufman, MD (Orthopedic Surgery) Lelon Perla, MD (Cardiology) Ralene Bathe, MD as Consulting Physician (Ophthalmology) Future Appointments  Date Time Provider [place]  04/16/2019  8:00 AM Forrest Moron, MD PCP-PCP [facility]     Age-appropriate Screening Schedule: The list below includes current immunization status and future screening recommendations based on patient's age. Orders for these recommended tests are listed in the plan section. The patient has been provided with a written plan. Immunization History  Administered Date(s) Administered  . Hepatitis A 12/03/2009  . Influenza, High Dose Seasonal PF 09/13/2018  . Influenza,inj,Quad PF,6+ Mos 09/19/2013, 09/20/2014, 09/24/2015, 10/13/2016, 10/17/2017  . Pneumococcal Conjugate-13 09/24/2015  . Pneumococcal Polysaccharide-23 09/21/2003, 09/20/2014  . Tdap 07/22/2010  . Zoster 07/14/2011    Health Maintenance reviewed -  Shingles vaccine   Depression Screen-PHQ2/9 completed today  Depression screen [facility] 2/9 10/18/2018 10/13/2018 09/13/2018 07/26/2018 07/19/2018  Decreased Interest 0 0 0 0 0  Down, Depressed, Hopeless 0 0 0 0 0  PHQ - 2 Score 0 0 0 0 0       Depression Severity and Treatment Recommendations:  0-4= None  5-9= Mild / Treatment: Support, educate to call if worse; return in one month  10-14= Moderate / Treatment: Support, watchful waiting; Antidepressant or Psycotherapy  15-19= Moderately severe / Treatment: Antidepressant OR Psychotherapy  >= 20 = Major depression, severe / Antidepressant AND Psychotherapy  Functional Status Survey:   Is the patient deaf or have difficulty hearing?: No Does the patient have difficulty seeing, even when wearing glasses/contacts?: No Does  the patient have difficulty concentrating, remembering, or making decisions?: No Does the patient have difficulty walking or climbing stairs?: No Does the patient have difficulty dressing or bathing?: No Does the patient have difficulty doing errands alone such as visiting a doctor's office or shopping?: No    Hearing Evaluation: 1. Do you have trouble hearing the television when others do not?   No 2. Do you have to strain to hear/understand conversations? No   Advanced Care Planning: 1. Patient has executed an Advance Directive: Yes  Cognitive Assessment: Does the patient have evidence of cognitive impairment? No The patient does not have any evidence of any cognitive problems and denies any  change in mood/affect, appearance, speech, memory or  motor skills.  Identification of Risk Factors: Risk factors include: hyperlipidemia and hypertension  Review of Systems  Constitutional: Negative for chills, fever and weight loss.  Respiratory: Negative for cough, shortness of breath and wheezing.   Genitourinary: Negative for dysuria, flank pain and hematuria.  Musculoskeletal:       Right shoulder with stiffness with putting on clothes. Improving since being on prednisone.   Neurological: Negative for dizziness, tingling, tremors and headaches.      Objective:   Vitals:   10/18/18 0812  BP: 138/80  Pulse: 80  Resp: 16  Temp: 98.2 F (36.8 C)  TempSrc: Oral  SpO2: 96%  Weight: 208 lb 3.2 oz (94.4 kg)  Height: 5\' 9"  (1.753 m)    Body mass index is 30.75 kg/m.  Physical Exam  Constitutional: He is oriented to person, place, and time. He appears well-developed and well-nourished.  HENT:  Head: Normocephalic and atraumatic.  Eyes: Conjunctivae and EOM are normal.  Neck: Normal range of motion. Neck supple.  Cardiovascular: Normal rate, regular rhythm and normal heart sounds.  No murmur heard. Pulmonary/Chest: Effort normal and breath sounds normal. No stridor. No  respiratory distress.  Neurological: He is alert and oriented to person, place, and time.  Skin: Skin is warm. Capillary refill takes less than 2 seconds.  Psychiatric: He has a normal mood and affect. His behavior is normal. Judgment and thought content normal.       Assessment/Plan:   Patient Self-Management and Personalized Health Advice The patient has been provided with information about:  follow a low fat, low cholesterol diet, attempt to lose weight and reduce salt in diet and cooking  During the course of the visit the patient was educated and counseled about appropriate screening and preventive services including:   return annually or prn     Body mass index is 30.75 kg/m. Discussed the patient's BMI with him. The BMI BMI is not in the acceptable range; BMI management plan is completed  Tell was seen today for annual exam.  Diagnoses and all orders for this visit:  Encounter for Medicare annual wellness exam-  Health maintenance up to date  Rotator cuff tendinitis, right- improving  Reviewed stretches  Mixed hyperlipidemia- changed to pravachol from zocor to improve control and reduce the risk of myalgia -     pravastatin (PRAVACHOL) 20 MG tablet; Take 1 tablet (20 mg total) by mouth daily. -     Lipid panel; Future -     Comprehensive metabolic panel; Future  Other orders -     Zoster Vaccine Adjuvanted [facility]) injection; Inject 0.5 mLs into the muscle once for 1 dose.      Return in about 6 months (around 04/18/2019) for cholesterol check.  Future Appointments  Date Time Provider [place]  04/16/2019  8:00 AM Forrest Moron, MD PCP-PCP [facility]    Patient Instructions       If you have lab work done today you will be contacted with your lab results within the next 2 weeks.  If you have not heard from [place] then please contact us. The fastest way to get your results is to register for My Chart.   IF you received an x-ray today, you will  receive an invoice from [facility]. Please contact [facility] at [phone] with questions or concerns regarding your invoice.   IF you received labwork today, you will receive an invoice from [place]. Please contact [facility] at [phone] with questions or concerns regarding  your invoice.   Our billing staff will not be able to assist you with questions regarding bills from these companies.  You will be contacted with the lab results as soon as they are available. The fastest way to get your results is to activate your My Chart account. Instructions are located on the last page of this paperwork. If you have not heard from [place] regarding the results in 2 weeks, please contact this office.     Rotator Cuff Tear Rehab After Surgery Ask your health care provider which exercises are safe for you. Do exercises exactly as told by your health care provider and adjust them as directed. It is normal to feel mild stretching, pulling, tightness, or discomfort as you do these exercises, but you should stop right away if you feel sudden pain or your pain gets worse. Do not begin these exercises until told by your health care provider. Stretching and range of motion exercises These exercises warm up your muscles and joints and improve the movement and flexibility of your shoulder. These exercises also help to relieve pain, numbness, and tingling. Exercise A: Pendulum  1. Stand near a wall or a surface that you can hold onto for balance. 2. Bend at the waist and let your left / right arm hang straight down. Use your other arm to keep your balance. 3. Relax your arm and shoulder muscles, and move your hips and your trunk so your left / right arm swings freely. Your arm should swing because of the motion of your body, not because you are using your arm or shoulder muscles. 4. Keep moving so your arm swings in the following directions, as told by your health care provider: ? Side to  side. ? Forward and backward. ? In clockwise and counterclockwise circles. Repeat __________ times, or for __________ seconds per direction. Complete this exercise __________ times a day. Exercise B: Flexion, seated  1. Sit in a stable chair so your left / right forearm can rest on a flat surface. Your elbow should rest at a height that keeps your upper arm next to your body. 2. Keeping your shoulder relaxed, lean forward at the waist and let your hand slide forward. Stop when you feel a stretch in your shoulder, or when you reach the angle that is recommended by your health care provider. 3. Hold for __________ seconds. 4. Slowly return to the starting position. Repeat __________ times. Complete this exercise __________ times a day. Exercise C: Flexion, standing  1. Stand and hold a broomstick, a cane, or a similar object. Place your hands a little more than shoulder-width apart on the object. Your left / right hand should be palm-up, and your other hand should be palm-down. 2. Push the stick down with your healthy arm to raise your left / right arm in front of your body, and then over your head. Use your other hand to help move the stick. Stop when you feel a stretch in your shoulder, or when you reach the angle that is recommended by your health care provider. ? Avoid shrugging your shoulder while you raise your arm. Keep your shoulder blade tucked down toward your spine. ? Keep your left / right shoulder muscles relaxed. 3. Hold for __________ seconds. 4. Slowly return to the starting position. Repeat __________ times. Complete this exercise __________ times a day. Exercise D: Abduction, supine  1. Lie on your back and hold a broomstick, a cane, or a similar object. Place your hands a  little more than shoulder-width apart on the object. Your left / right hand should be palm-up, and your other hand should be palm-down. 2. Push the stick to raise your left / right arm out to your side and  then over your head. Use your other hand to help move the stick. Stop when you feel a stretch in your shoulder, or when you reach the angle that is recommended by your health care provider. ? Avoid shrugging your shoulder while you raise your arm. Keep your shoulder blade tucked down toward your spine. 3. Hold for __________ seconds. 4. Slowly return to the starting position. Repeat __________ times. Complete this exercise __________ times a day. Exercise E: Shoulder flexion, active-assisted  1. Lie on your back. You may bend your knees for comfort. 2. Hold a broomstick, a cane, or a similar object so your hands are about shoulder-width apart. Your palms should face toward your feet. 3. Raise your left / right arm over your head and behind your head, toward the floor. Use your other hand to help you do this. Stop when you feel a gentle stretch in your shoulder, or when you reach the angle that is recommended by your health care provider. 4. Hold for __________ seconds. 5. Use the broomstick and your other arm to help you return your left / right arm to the starting position. Repeat __________ times. Complete this exercise __________ times a day. Exercise F: External rotation  1. Sit in a stable chair without armrests, or stand. 2. Tuck a soft object, such as a folded towel or a small ball, under your left / right upper arm. 3. Hold a broomstick, a cane, or a similar object so your palms face down, toward the floor. Bend your elbows to an "L" shape (90 degrees), and keep your hands about shoulder-width apart. 4. Straighten your healthy arm and push the broomstick across your body, toward your left / right side. Keep your left / right arm bent. This will rotate your left / right forearm away from your body. 5. Hold for __________ seconds. 6. Slowly return to the starting position. Repeat __________ times. Complete this exercise __________ times a day. Strengthening exercises These exercises  build strength and endurance in your shoulder. Endurance is the ability to use your muscles for a long time, even after they get tired. Exercise G: Shoulder flexion, isometric  1. Stand or sit about 4-6 inches (10-15 cm) away from a wall with your left / right side facing the wall. 2. Gently make a fist and place your left / right hand on the wall so the top of your fist touches the wall. 3. With your left / right elbow straight, gently press the top of your fist into the wall. Gradually increase the pressure until you are pressing as hard as you can without shrugging your shoulder. 4. Hold for __________ seconds. 5. Slowly release the tension and relax your muscles completely before you repeat the exercise. Repeat __________ times. Complete this exercise __________ times a day. Exercise H: Shoulder abduction, isometric  1. Stand or sit about 4-6 inches (10-15 cm) away from a wall with your right/left side facing the wall. 2. Bend your left / right elbow and gently press your elbow into the wall as if you are trying to move your arm out to your side. Increase the pressure gradually until you are pressing as hard as you can without shrugging your shoulder. 3. Hold for __________ seconds. 4. Slowly release the  tension and relax your muscles completely before repeating the exercise. Repeat __________ times. Complete this exercise __________ times a day. Exercise I: Internal rotation, isometric  1. Stand or sit in a doorway, facing the door frame. 2. Bend your left / right elbow and place the palm of your hand against the door frame. Only your palm should be touching the frame. Keep your upper arm at your side. 3. Gently press your hand into the door frame, as if you are trying to push your arm toward your abdomen. Do not let your wrist bend. ? Avoid shrugging your shoulder while you press your hand into the door frame. Keep your shoulder blade tucked down toward the middle of your back. 4. Hold  for __________ seconds. 5. Slowly release the tension, and relax your muscles completely before you repeat the exercise. Repeat __________ times. Complete this exercise __________ times a day. Exercise J: External rotation, isometric  1. Stand or sit in a doorway, facing the door frame. 2. Bend your left / right elbow and place the back of your wrist against the door frame. Only the back of your wrist should be touching the frame. Keep your upper arm at your side. 3. Gently press your wrist against the door frame, as if you are trying to push your arm away from your abdomen. ? Avoid shrugging your shoulder while you press your wrist into the door frame. Keep your shoulder blade tucked down toward the middle of your back. 4. Hold for __________ seconds. 5. Slowly release the tension, and relax your muscles completely before you repeat the exercise. Repeat __________ times. Complete this exercise __________ times a day. This information is not intended to replace advice given to you by your health care provider. Make sure you discuss any questions you have with your health care provider. Document Released: 11/29/2005 Document Revised: 08/05/2016 Document Reviewed: 12/13/2015 Elsevier Interactive Patient Education  [employer].    An after visit summary with all of these plans was given to the patient.

## 2018-10-18 NOTE — Patient Instructions (Addendum)
If you have lab work done today you will be contacted with your lab results within the next 2 weeks.  If you have not heard from [place] then please contact us. The fastest way to get your results is to register for My Chart.   IF you received an x-ray today, you will receive an invoice from [facility]. Please contact [facility] at [phone] with questions or concerns regarding your invoice.   IF you received labwork today, you will receive an invoice from [facility]. Please contact [facility] at [phone] with questions or concerns regarding your invoice.   Our billing staff will not be able to assist you with questions regarding bills from these companies.  You will be contacted with the lab results as soon as they are available. The fastest way to get your results is to activate your My Chart account. Instructions are located on the last page of this paperwork. If you have not heard from [place] regarding the results in 2 weeks, please contact this office.     Rotator Cuff Tear Rehab After Surgery Ask your health care provider which exercises are safe for you. Do exercises exactly as told by your health care provider and adjust them as directed. It is normal to feel mild stretching, pulling, tightness, or discomfort as you do these exercises, but you should stop right away if you feel sudden pain or your pain gets worse. Do not begin these exercises until told by your health care provider. Stretching and range of motion exercises These exercises warm up your muscles and joints and improve the movement and flexibility of your shoulder. These exercises also help to relieve pain, numbness, and tingling. Exercise A: Pendulum  1. Stand near a wall or a surface that you can hold onto for balance. 2. Bend at the waist and let your left / right arm hang straight down. Use your other arm to keep your balance. 3. Relax your arm and shoulder muscles, and move your hips and your trunk  so your left / right arm swings freely. Your arm should swing because of the motion of your body, not because you are using your arm or shoulder muscles. 4. Keep moving so your arm swings in the following directions, as told by your health care provider: ? Side to side. ? Forward and backward. ? In clockwise and counterclockwise circles. Repeat __________ times, or for __________ seconds per direction. Complete this exercise __________ times a day. Exercise B: Flexion, seated  1. Sit in a stable chair so your left / right forearm can rest on a flat surface. Your elbow should rest at a height that keeps your upper arm next to your body. 2. Keeping your shoulder relaxed, lean forward at the waist and let your hand slide forward. Stop when you feel a stretch in your shoulder, or when you reach the angle that is recommended by your health care provider. 3. Hold for __________ seconds. 4. Slowly return to the starting position. Repeat __________ times. Complete this exercise __________ times a day. Exercise C: Flexion, standing  1. Stand and hold a broomstick, a cane, or a similar object. Place your hands a little more than shoulder-width apart on the object. Your left / right hand should be palm-up, and your other hand should be palm-down. 2. Push the stick down with your healthy arm to raise your left / right arm in front of your body, and then over your head. Use your other hand to  help move the stick. Stop when you feel a stretch in your shoulder, or when you reach the angle that is recommended by your health care provider. ? Avoid shrugging your shoulder while you raise your arm. Keep your shoulder blade tucked down toward your spine. ? Keep your left / right shoulder muscles relaxed. 3. Hold for __________ seconds. 4. Slowly return to the starting position. Repeat __________ times. Complete this exercise __________ times a day. Exercise D: Abduction, supine  1. Lie on your back and hold a  broomstick, a cane, or a similar object. Place your hands a little more than shoulder-width apart on the object. Your left / right hand should be palm-up, and your other hand should be palm-down. 2. Push the stick to raise your left / right arm out to your side and then over your head. Use your other hand to help move the stick. Stop when you feel a stretch in your shoulder, or when you reach the angle that is recommended by your health care provider. ? Avoid shrugging your shoulder while you raise your arm. Keep your shoulder blade tucked down toward your spine. 3. Hold for __________ seconds. 4. Slowly return to the starting position. Repeat __________ times. Complete this exercise __________ times a day. Exercise E: Shoulder flexion, active-assisted  1. Lie on your back. You may bend your knees for comfort. 2. Hold a broomstick, a cane, or a similar object so your hands are about shoulder-width apart. Your palms should face toward your feet. 3. Raise your left / right arm over your head and behind your head, toward the floor. Use your other hand to help you do this. Stop when you feel a gentle stretch in your shoulder, or when you reach the angle that is recommended by your health care provider. 4. Hold for __________ seconds. 5. Use the broomstick and your other arm to help you return your left / right arm to the starting position. Repeat __________ times. Complete this exercise __________ times a day. Exercise F: External rotation  1. Sit in a stable chair without armrests, or stand. 2. Tuck a soft object, such as a folded towel or a small ball, under your left / right upper arm. 3. Hold a broomstick, a cane, or a similar object so your palms face down, toward the floor. Bend your elbows to an "L" shape (90 degrees), and keep your hands about shoulder-width apart. 4. Straighten your healthy arm and push the broomstick across your body, toward your left / right side. Keep your left / right arm  bent. This will rotate your left / right forearm away from your body. 5. Hold for __________ seconds. 6. Slowly return to the starting position. Repeat __________ times. Complete this exercise __________ times a day. Strengthening exercises These exercises build strength and endurance in your shoulder. Endurance is the ability to use your muscles for a long time, even after they get tired. Exercise G: Shoulder flexion, isometric  1. Stand or sit about 4-6 inches (10-15 cm) away from a wall with your left / right side facing the wall. 2. Gently make a fist and place your left / right hand on the wall so the top of your fist touches the wall. 3. With your left / right elbow straight, gently press the top of your fist into the wall. Gradually increase the pressure until you are pressing as hard as you can without shrugging your shoulder. 4. Hold for __________ seconds. 5. Slowly release the  tension and relax your muscles completely before you repeat the exercise. Repeat __________ times. Complete this exercise __________ times a day. Exercise H: Shoulder abduction, isometric  1. Stand or sit about 4-6 inches (10-15 cm) away from a wall with your right/left side facing the wall. 2. Bend your left / right elbow and gently press your elbow into the wall as if you are trying to move your arm out to your side. Increase the pressure gradually until you are pressing as hard as you can without shrugging your shoulder. 3. Hold for __________ seconds. 4. Slowly release the tension and relax your muscles completely before repeating the exercise. Repeat __________ times. Complete this exercise __________ times a day. Exercise I: Internal rotation, isometric  1. Stand or sit in a doorway, facing the door frame. 2. Bend your left / right elbow and place the palm of your hand against the door frame. Only your palm should be touching the frame. Keep your upper arm at your side. 3. Gently press your hand into  the door frame, as if you are trying to push your arm toward your abdomen. Do not let your wrist bend. ? Avoid shrugging your shoulder while you press your hand into the door frame. Keep your shoulder blade tucked down toward the middle of your back. 4. Hold for __________ seconds. 5. Slowly release the tension, and relax your muscles completely before you repeat the exercise. Repeat __________ times. Complete this exercise __________ times a day. Exercise J: External rotation, isometric  1. Stand or sit in a doorway, facing the door frame. 2. Bend your left / right elbow and place the back of your wrist against the door frame. Only the back of your wrist should be touching the frame. Keep your upper arm at your side. 3. Gently press your wrist against the door frame, as if you are trying to push your arm away from your abdomen. ? Avoid shrugging your shoulder while you press your wrist into the door frame. Keep your shoulder blade tucked down toward the middle of your back. 4. Hold for __________ seconds. 5. Slowly release the tension, and relax your muscles completely before you repeat the exercise. Repeat __________ times. Complete this exercise __________ times a day. This information is not intended to replace advice given to you by your health care provider. Make sure you discuss any questions you have with your health care provider. Document Released: 11/29/2005 Document Revised: 08/05/2016 Document Reviewed: 12/13/2015 Elsevier Interactive Patient Education  [employer].

## 2018-10-31 ENCOUNTER — Encounter: Payer: Medicare Other | Admitting: Family Medicine

## 2018-10-31 ENCOUNTER — Other Ambulatory Visit: Payer: Self-pay | Admitting: Family Medicine

## 2019-01-14 ENCOUNTER — Other Ambulatory Visit: Payer: Self-pay | Admitting: Family Medicine

## 2019-01-14 DIAGNOSIS — R609 Edema, unspecified: Secondary | ICD-10-CM

## 2019-04-13 ENCOUNTER — Ambulatory Visit (INDEPENDENT_AMBULATORY_CARE_PROVIDER_SITE_OTHER): Payer: Medicare Other | Admitting: Family Medicine

## 2019-04-13 ENCOUNTER — Other Ambulatory Visit: Payer: Self-pay

## 2019-04-13 DIAGNOSIS — E782 Mixed hyperlipidemia: Secondary | ICD-10-CM | POA: Diagnosis not present

## 2019-04-13 LAB — COMPREHENSIVE METABOLIC PANEL
ALT: 27 IU/L (ref 0–44)
AST: 28 IU/L (ref 0–40)
Albumin/Globulin Ratio: 1.5 (ref 1.2–2.2)
Albumin: 4.5 g/dL (ref 3.8–4.8)
Alkaline Phosphatase: 81 IU/L (ref 39–117)
BUN/Creatinine Ratio: 14 (ref 10–24)
BUN: 17 mg/dL (ref 8–27)
Bilirubin Total: 0.5 mg/dL (ref 0.0–1.2)
CO2: 26 mmol/L (ref 20–29)
Calcium: 9.9 mg/dL (ref 8.6–10.2)
Chloride: 99 mmol/L (ref 96–106)
Creatinine, Ser: 1.2 mg/dL (ref 0.76–1.27)
GFR calc Af Amer: 70 mL/min/{1.73_m2} (ref >59)
GFR calc non Af Amer: 61 mL/min/{1.73_m2} (ref >59)
Globulin, Total: 3.1 g/dL (ref 1.5–4.5)
Glucose: 96 mg/dL (ref 65–99)
Potassium: 4.5 mmol/L (ref 3.5–5.2)
Sodium: 139 mmol/L (ref 134–144)
Total Protein: 7.6 g/dL (ref 6.0–8.5)

## 2019-04-13 LAB — LIPID PANEL
Chol/HDL Ratio: 4.8 ratio (ref 0.0–5.0)
Cholesterol, Total: 245 mg/dL — ABNORMAL HIGH (ref 100–199)
HDL: 51 mg/dL (ref >39)
LDL Calculated: 167 mg/dL — ABNORMAL HIGH (ref 0–99)
Triglycerides: 133 mg/dL (ref 0–149)
VLDL Cholesterol Cal: 27 mg/dL (ref 5–40)

## 2019-04-16 ENCOUNTER — Telehealth (INDEPENDENT_AMBULATORY_CARE_PROVIDER_SITE_OTHER): Payer: Medicare Other | Admitting: Family Medicine

## 2019-04-16 ENCOUNTER — Other Ambulatory Visit: Payer: Self-pay

## 2019-04-16 DIAGNOSIS — E782 Mixed hyperlipidemia: Secondary | ICD-10-CM

## 2019-04-16 DIAGNOSIS — M25511 Pain in right shoulder: Secondary | ICD-10-CM

## 2019-04-16 MED ORDER — PRAVASTATIN SODIUM 40 MG PO TABS: 40.0000 mg | ORAL_TABLET | Freq: Every day | ORAL | 1 refills | Status: DC

## 2019-04-16 NOTE — Progress Notes (Signed)
CC: 6 month f/u cholesterol, pt still having issues with right shoulder pain.  Taking a otc aleve 200 mg med for the pain and is helping.  Pain level 3/10. Needs refill pravastatin.  No travel outside [place] or [place] in the past 3 weeks.

## 2019-04-16 NOTE — Patient Instructions (Signed)
° ° ° °  If you have lab work done today you will be contacted with your lab results within the next 2 weeks.  If you have not heard from us then please contact us. The fastest way to get your results is to register for My Chart. ° ° °IF you received an x-ray today, you will receive an invoice from [facility]. Please contact [facility] at [phone] with questions or concerns regarding your invoice.  ° °IF you received labwork today, you will receive an invoice from [facility]. Please contact [facility] at [phone] with questions or concerns regarding your invoice.  ° °Our billing staff will not be able to assist you with questions regarding bills from these companies. ° °You will be contacted with the lab results as soon as they are available. The fastest way to get your results is to activate your My Chart account. Instructions are located on the last page of this paperwork. If you have not heard from us regarding the results in 2 weeks, please contact this office. °  ° ° ° °

## 2019-04-16 NOTE — Progress Notes (Signed)
VIDEO Encounter- SOAP NOTE Established Patient  This VIDEO encounter was conducted with the patient's (or proxy's) verbal consent via audio telecommunications: yes/no: Yes Patient was instructed to have this encounter in a suitably private space; and to only have persons present to whom they give permission to participate. In addition, patient identity was confirmed by use of name plus two identifiers (DOB and address).  I discussed the limitations, risks, security and privacy concerns of performing an evaluation and management service by VIDEO and the availability of in person appointments. I also discussed with the patient that there may be a patient responsible charge related to this service. The patient expressed understanding and agreed to proceed.  I spent a total of TIME; 0 MIN TO 60 MIN: 15 minutes talking with the patient or their proxy.  Cc: cholesterol  Subjective   Jonathan Bridges is a 73 y.o. established patient. Telephone visit today for  HPI  Cholesterol Lab Results  Component Value Date   CHOL 245 (H) 04/13/2019   CHOL 200 (H) 09/13/2018   CHOL 177 10/17/2017   Lab Results  Component Value Date   HDL 51 04/13/2019   HDL 47 09/13/2018   HDL 50 10/17/2017   Lab Results  Component Value Date   LDLCALC 167 (H) 04/13/2019   LDLCALC 134 (H) 09/13/2018   LDLCALC 102 (H) 10/17/2017   Lab Results  Component Value Date   TRIG 133 04/13/2019   TRIG 96 09/13/2018   TRIG 125 10/17/2017   Lab Results  Component Value Date   CHOLHDL 4.8 04/13/2019   CHOLHDL 4.3 09/13/2018   CHOLHDL 3.5 10/17/2017   No results found for: LDLDIRECT  Pt reports that he has been using simvastatin 20mg  because that was what he was given at the pharmacy He states that he has been more sedentary He notices that he is eating more take out [place] and plan to exercise   He is having right shoulder pain  Pain is 3/10, nonradiating and improving He reports that he  takes alleve which alleviates the pain Denies pain in the elbow or wrist He has not been exercising   Patient Active Problem List   Diagnosis Date Noted  . Superficial thrombophlebitis 08/01/2018  . ED (erectile dysfunction) 08/09/2012  . Sleep apnea   . Hypercholesterolemia   . Chronic rhinitis   . Hyperlipidemia 02/29/2012    Past Medical History:  Diagnosis Date  . Anxiety   . Basal cell carcinoma   . Chronic rhinitis   . Depression   . Diverticulosis   . Elevated LFTs   . History of hypertension   . Hypercholesterolemia   . Sleep apnea     Current Outpatient Medications  Medication Sig Dispense Refill  . acetaminophen (TYLENOL 8 HOUR ARTHRITIS PAIN) 650 MG CR tablet Take 650 mg by mouth every 8 (eight) hours as needed for pain.    Marland Kitchen aspirin EC 325 MG tablet Take 1 tablet (325 mg total) by mouth daily. 30 tablet 6  . cholecalciferol (VITAMIN D) 1000 UNITS tablet Take 1,000 Units by mouth daily.    . fluticasone (FLONASE) 50 MCG/ACT nasal spray Place 2 sprays daily into both nostrils. 16 g 6  . hydrochlorothiazide (HYDRODIURIL) 25 MG tablet TAKE 1 TABLET(25 MG) BY MOUTH DAILY 90 tablet 0  . Multiple Vitamin (MULTIVITAMIN) tablet Take 1 tablet by mouth daily.    . pravastatin (PRAVACHOL) 40 MG tablet Take 1 tablet (40 mg  total) by mouth daily. 90 tablet 1  . Pyridoxine HCl (VITAMIN B-6 PO) Take by mouth.    . sildenafil (VIAGRA) 25 MG tablet Take 1 tablet (25 mg total) daily as needed by mouth for erectile dysfunction. (Patient not taking: Reported on 10/18/2018) 10 tablet 0  . simvastatin (ZOCOR) 40 MG tablet TAKE 1 TABLET BY MOUTH DAILY 90 tablet 1  . thiamine (VITAMIN B-1) 100 MG tablet Take 100 mg daily by mouth.     No current facility-administered medications for this visit.     No Known Allergies  Social History   Socioeconomic History  . Marital status: Married    Spouse name: Not on file  . Number of children: Not on file  . Years of education: Not on file   . Highest education level: Not on file  Occupational History  . Occupation: Architectural technologist: [employer]  . Financial resource strain: Not on file  . Food insecurity:    Worry: Not on file    Inability: Not on file  . Transportation needs:    Medical: Not on file    Non-medical: Not on file  Tobacco Use  . Smoking status: Never Smoker  . Smokeless tobacco: Never Used  Substance and Sexual Activity  . Alcohol use: No  . Drug use: No  . Sexual activity: Yes  Lifestyle  . Physical activity:    Days per week: Not on file    Minutes per session: Not on file  . Stress: Not on file  Relationships  . Social connections:    Talks on phone: Not on file    Gets together: Not on file    Attends religious service: Not on file    Active member of club or organization: Not on file    Attends meetings of clubs or organizations: Not on file    Relationship status: Not on file  . Intimate partner violence:    Fear of current or ex partner: Not on file    Emotionally abused: Not on file    Physically abused: Not on file    Forced sexual activity: Not on file  Other Topics Concern  . Not on file  Social History Narrative   Exercise walking          ROS Review of Systems  Constitutional: Negative for activity change, appetite change, chills and fever.  HENT: Negative for congestion, nosebleeds, trouble swallowing and voice change.   Respiratory: Negative for cough, shortness of breath and wheezing.   Gastrointestinal: Negative for diarrhea, nausea and vomiting.  Genitourinary: Negative for difficulty urinating, dysuria, flank pain and hematuria.  Musculoskeletal: Negative for back pain, joint swelling and neck pain.  Neurological: Negative for dizziness, speech difficulty, light-headedness and numbness.  See HPI. All other review of systems negative.   Objective   Vitals as reported by the patient: There were no vitals filed for this visit.   Diagnoses and all orders for this visit:  Right anterior shoulder pain-  Continue alleve, stretches and topical rubs Follow up for xray if symptoms worsen  Mixed hyperlipidemia- will change zocor , add more vegetables, increase exercise -     pravastatin (PRAVACHOL) 40 MG tablet; Take 1 tablet (40 mg total) by mouth daily.     I discussed the assessment and treatment plan with the patient. The patient was provided an opportunity to ask questions and all were answered. The patient agreed with the plan and demonstrated  an understanding of the instructions.   The patient was advised to call back or seek an in-person evaluation if the symptoms worsen or if the condition fails to improve as anticipated.  I provided 15 minutes of non-face-to-face time during this encounter.  Forrest Moron, MD  Primary Care at [facility]

## 2019-04-23 ENCOUNTER — Encounter: Payer: Self-pay | Admitting: Family Medicine

## 2019-04-25 ENCOUNTER — Encounter: Payer: Self-pay | Admitting: Family Medicine

## 2019-05-10 NOTE — Progress Notes (Signed)
LVM FOR PATIENT TO CALL BACK AND SCHEDULE 6MOS APPNT

## 2019-05-19 ENCOUNTER — Other Ambulatory Visit: Payer: Self-pay | Admitting: Family Medicine

## 2019-05-19 DIAGNOSIS — R609 Edema, unspecified: Secondary | ICD-10-CM

## 2019-05-19 NOTE — Telephone Encounter (Signed)
Requested Prescriptions  Pending Prescriptions Disp Refills  . hydrochlorothiazide (HYDRODIURIL) 25 MG tablet [Pharmacy Med Name: HYDROCHLOROTHIAZIDE 25MG  TABLETS] 30 tablet 0    Sig: TAKE 1/2 TABLET(12.5 MG) BY MOUTH EVERY MORNING     Cardiovascular: Diuretics - Thiazide Failed - 05/19/2019 10:53 AM      Failed - Valid encounter within last 6 months    Recent Outpatient Visits          1 month ago Mixed hyperlipidemia   Primary Care at [facility], Arlie Solomons, MD   7 months ago Encounter for [employer] annual wellness exam   Primary Care at [facility], [place] A, MD   7 months ago Rotator cuff tendinitis, right   Primary Care at [facility], Arlie Solomons, MD   8 months ago Mixed hyperlipidemia   Primary Care at [facility], [place] A, MD   9 months ago Thrombophlebitis   Primary Care at [place], MD      Future Appointments            In 6 months Forrest Moron, MD Primary Care at [place], [place] in normal range and within 360 days    Calcium  Date Value Ref Range Status  04/13/2019 9.9 8.6 - 10.2 mg/dL Final         Passed - Cr in normal range and within 360 days    Creat  Date Value Ref Range Status  10/13/2016 1.25 0.70 - 1.25 mg/dL Final    Comment:      For patients > or = 71 years of age: The upper reference limit for Creatinine is approximately 13% higher for people identified as African-American.      Creatinine, Ser  Date Value Ref Range Status  04/13/2019 1.20 0.76 - 1.27 mg/dL Final         Passed - K in normal range and within 360 days    Potassium  Date Value Ref Range Status  04/13/2019 4.5 3.5 - 5.2 mmol/L Final         Passed - Na in normal range and within 360 days    Sodium  Date Value Ref Range Status  04/13/2019 139 134 - 144 mmol/L Final         Passed - Last BP in normal range    BP Readings from Last 1 Encounters:  10/18/18 138/80

## 2019-06-25 ENCOUNTER — Encounter: Payer: Self-pay | Admitting: Family Medicine

## 2019-07-01 ENCOUNTER — Other Ambulatory Visit: Payer: Self-pay | Admitting: Family Medicine

## 2019-07-01 DIAGNOSIS — R609 Edema, unspecified: Secondary | ICD-10-CM

## 2019-07-01 NOTE — Telephone Encounter (Signed)
Requested Prescriptions  Pending Prescriptions Disp Refills  . hydrochlorothiazide (HYDRODIURIL) 25 MG tablet [Pharmacy Med Name: HYDROCHLOROTHIAZIDE 25MG  TABLETS] 30 tablet 0    Sig: TAKE 1/2 TABLET(12.5 MG) BY MOUTH EVERY MORNING     Cardiovascular: Diuretics - Thiazide Failed - 07/01/2019  8:48 AM      Failed - Valid encounter within last 6 months    Recent Outpatient Visits          2 months ago Mixed hyperlipidemia   Primary Care at [facility], Arlie Solomons, MD   8 months ago Encounter for [employer] annual wellness exam   Primary Care at [facility], [place] A, MD   8 months ago Rotator cuff tendinitis, right   Primary Care at [facility], Arlie Solomons, MD   9 months ago Mixed hyperlipidemia   Primary Care at [facility], [place] A, MD   11 months ago Thrombophlebitis   Primary Care at [place], MD      Future Appointments            In 4 months Forrest Moron, MD Primary Care at [place], [place] in normal range and within 360 days    Calcium  Date Value Ref Range Status  04/13/2019 9.9 8.6 - 10.2 mg/dL Final         Passed - Cr in normal range and within 360 days    Creat  Date Value Ref Range Status  10/13/2016 1.25 0.70 - 1.25 mg/dL Final    Comment:      For patients > or = 71 years of age: The upper reference limit for Creatinine is approximately 13% higher for people identified as African-American.      Creatinine, Ser  Date Value Ref Range Status  04/13/2019 1.20 0.76 - 1.27 mg/dL Final         Passed - K in normal range and within 360 days    Potassium  Date Value Ref Range Status  04/13/2019 4.5 3.5 - 5.2 mmol/L Final         Passed - Na in normal range and within 360 days    Sodium  Date Value Ref Range Status  04/13/2019 139 134 - 144 mmol/L Final         Passed - Last BP in normal range    BP Readings from Last 1 Encounters:  10/18/18 138/80

## 2019-07-15 ENCOUNTER — Other Ambulatory Visit: Payer: Self-pay | Admitting: Family Medicine

## 2019-07-15 DIAGNOSIS — R609 Edema, unspecified: Secondary | ICD-10-CM

## 2019-07-15 NOTE — Telephone Encounter (Signed)
Requested medication (s) are due for refill today: yes  Requested medication (s) are on the active medication list: yes  Last refill:  07/01/19  Future visit scheduled: yes in 4 months  Notes to clinic:  Courtesy refill already given. Please advise.   Requested Prescriptions  Pending Prescriptions Disp Refills   hydrochlorothiazide (HYDRODIURIL) 25 MG tablet [Pharmacy Med Name: HYDROCHLOROTHIAZIDE 25MG  TABLETS] 30 tablet 0    Sig: TAKE 1/2 TABLET(12.5 MG) BY MOUTH EVERY MORNING     Cardiovascular: Diuretics - Thiazide Failed - 07/15/2019  7:53 AM      Failed - Valid encounter within last 6 months    Recent Outpatient Visits          3 months ago Mixed hyperlipidemia   Primary Care at [facility], Arlie Solomons, MD   9 months ago Encounter for [employer] annual wellness exam   Primary Care at [facility], [place] A, MD   9 months ago Rotator cuff tendinitis, right   Primary Care at [facility], Arlie Solomons, MD   10 months ago Mixed hyperlipidemia   Primary Care at [facility], [place] A, MD   11 months ago Thrombophlebitis   Primary Care at [place], MD      Future Appointments            In 4 months Forrest Moron, MD Primary Care at [place], [place] in normal range and within 360 days    Calcium  Date Value Ref Range Status  04/13/2019 9.9 8.6 - 10.2 mg/dL Final         Passed - Cr in normal range and within 360 days    Creat  Date Value Ref Range Status  10/13/2016 1.25 0.70 - 1.25 mg/dL Final    Comment:      For patients > or = 71 years of age: The upper reference limit for Creatinine is approximately 13% higher for people identified as African-American.      Creatinine, Ser  Date Value Ref Range Status  04/13/2019 1.20 0.76 - 1.27 mg/dL Final         Passed - K in normal range and within 360 days    Potassium  Date Value Ref Range Status  04/13/2019 4.5 3.5 - 5.2 mmol/L Final         Passed - Na in normal range and  within 360 days    Sodium  Date Value Ref Range Status  04/13/2019 139 134 - 144 mmol/L Final         Passed - Last BP in normal range    BP Readings from Last 1 Encounters:  10/18/18 138/80

## 2019-07-16 ENCOUNTER — Encounter: Payer: Self-pay | Admitting: Internal Medicine

## 2019-07-19 ENCOUNTER — Other Ambulatory Visit: Payer: Self-pay | Admitting: Family Medicine

## 2019-07-19 DIAGNOSIS — R609 Edema, unspecified: Secondary | ICD-10-CM

## 2019-07-19 NOTE — Telephone Encounter (Signed)
Please advise on dose of HCTZ.  Has been on HCTZ 25 mg, 1/2 tab qd, and reports that Dr. Nolon Rod advised him to increase dose.  Next appt. 11/2019.  Pt. Is requesting refill, prior to going out of town.

## 2019-07-19 NOTE — Telephone Encounter (Signed)
Pt stated he contacted pharmacy for a refill of hydrochlorothiazide (HYDRODIURIL) 25 MG tablet but it was denied because it was too early. Pt stated Dr. Nolon Rod had him increase the dosage on his hydrochlorothiazide (HYDRODIURIL) 25 MG tablet causing him to run out sooner. Pt has enough for about 10 days and will be going out of town until after labor day to [place]. He would like to know if Dr. Nolon Rod would refill rx. Please advise. Requesting callback.  [id number] - [address], [place] [phone] (Phone) [phone] (Fax)

## 2019-07-20 MED ORDER — HYDROCHLOROTHIAZIDE 25 MG PO TABS: 12.5000 mg | ORAL_TABLET | Freq: Every day | ORAL | 1 refills | Status: DC

## 2019-07-20 NOTE — Addendum Note (Signed)
Addended by: Kittie Plater, Caydn Justen HUA on: 07/20/2019 11:29 AM   Modules accepted: Orders

## 2019-07-20 NOTE — Telephone Encounter (Signed)
I attempted to call pt back. There was no answer so I left a message to call back.

## 2019-08-01 ENCOUNTER — Encounter: Payer: Self-pay | Admitting: Internal Medicine

## 2019-08-10 ENCOUNTER — Encounter: Payer: Self-pay | Admitting: Internal Medicine

## 2019-09-03 ENCOUNTER — Ambulatory Visit (AMBULATORY_SURGERY_CENTER): Payer: Medicare Other | Admitting: *Deleted

## 2019-09-03 ENCOUNTER — Other Ambulatory Visit: Payer: Self-pay

## 2019-09-03 VITALS — Temp 96.2°F | Ht 71.0 in | Wt 215.0 lb

## 2019-09-03 DIAGNOSIS — Z1211 Encounter for screening for malignant neoplasm of colon: Secondary | ICD-10-CM

## 2019-09-03 MED ORDER — NA SULFATE-K SULFATE-MG SULF 17.5-3.13-1.6 GM/177ML PO SOLN: 1.0000 | Freq: Once | ORAL | 0 refills | Status: AC

## 2019-09-03 NOTE — Progress Notes (Signed)

## 2019-09-04 ENCOUNTER — Encounter: Payer: Self-pay | Admitting: Internal Medicine

## 2019-09-06 ENCOUNTER — Encounter: Payer: Medicare Other | Admitting: Internal Medicine

## 2019-09-17 ENCOUNTER — Encounter: Payer: Self-pay | Admitting: Internal Medicine

## 2019-09-17 ENCOUNTER — Other Ambulatory Visit: Payer: Self-pay

## 2019-09-17 ENCOUNTER — Ambulatory Visit (AMBULATORY_SURGERY_CENTER): Payer: Medicare Other | Admitting: Internal Medicine

## 2019-09-17 VITALS — BP 141/86 | HR 71 | Temp 98.4°F | Resp 16 | Ht 71.0 in | Wt 215.0 lb

## 2019-09-17 DIAGNOSIS — Z1211 Encounter for screening for malignant neoplasm of colon: Secondary | ICD-10-CM | POA: Diagnosis not present

## 2019-09-17 MED ORDER — SODIUM CHLORIDE 0.9 % IV SOLN: 500.0000 mL | Freq: Once | INTRAVENOUS | Status: AC

## 2019-09-17 NOTE — Progress Notes (Deleted)
Pt's states no medical or surgical changes since previsit or office visit.  Temp taken by KA VS taken by CW

## 2019-09-17 NOTE — Patient Instructions (Signed)
Thank you for allowing [place] to care for you today!  Resume previous diet and medications today.  Return to your normal activities tomorrow.  No further screening colonoscopies are recommend based on current age guidelines.  Handout given for diverticulosis.  YOU HAD AN ENDOSCOPIC PROCEDURE TODAY AT [facility]:   Refer to the procedure report that was given to you for any specific questions about what was found during the examination.  If the procedure report does not answer your questions, please call your gastroenterologist to clarify.  If you requested that your care partner not be given the details of your procedure findings, then the procedure report has been included in a sealed envelope for you to review at your convenience later.  YOU SHOULD EXPECT: Some feelings of bloating in the abdomen. Passage of more gas than usual.  Walking can help get rid of the air that was put into your GI tract during the procedure and reduce the bloating. If you had a lower endoscopy (such as a colonoscopy or flexible sigmoidoscopy) you may notice spotting of blood in your stool or on the toilet paper. If you underwent a bowel prep for your procedure, you may not have a normal bowel movement for a few days.  Please Note:  You might notice some irritation and congestion in your nose or some drainage.  This is from the oxygen used during your procedure.  There is no need for concern and it should clear up in a day or so.  SYMPTOMS TO REPORT IMMEDIATELY:   Following lower endoscopy (colonoscopy or flexible sigmoidoscopy):  Excessive amounts of blood in the stool  Significant tenderness or worsening of abdominal pains  Swelling of the abdomen that is new, acute  Fever of 100F or higher   For urgent or emergent issues, a gastroenterologist can be reached at any hour by calling [phone].   DIET:  We do recommend a small meal at first, but then you may proceed to your regular diet.   Drink plenty of fluids but you should avoid alcoholic beverages for 24 hours.  ACTIVITY:  You should plan to take it easy for the rest of today and you should NOT DRIVE or use heavy machinery until tomorrow (because of the sedation medicines used during the test).    FOLLOW UP: Our staff will call the number listed on your records 48-72 hours following your procedure to check on you and address any questions or concerns that you may have regarding the information given to you following your procedure. If we do not reach you, we will leave a message.  We will attempt to reach you two times.  During this call, we will ask if you have developed any symptoms of COVID 19. If you develop any symptoms (ie: fever, flu-like symptoms, shortness of breath, cough etc.) before then, please call [phone].  If you test positive for Covid 19 in the 2 weeks post procedure, please call and report this information to [place].    If any biopsies were taken you will be contacted by phone or by letter within the next 1-3 weeks.  Please call us at [phone] if you have not heard about the biopsies in 3 weeks.    SIGNATURES/CONFIDENTIALITY: You and/or your care partner have signed paperwork which will be entered into your electronic medical record.  These signatures attest to the fact that that the information above on your After Visit Summary has been reviewed and is understood.  Full responsibility of the confidentiality of this discharge information lies with you and/or your care-partner.

## 2019-09-17 NOTE — Progress Notes (Signed)
PT taken to PACU. Monitors in place. VSS. Report given to RN. 

## 2019-09-17 NOTE — Op Note (Signed)
[employer] Patient Name: Vitali Cianflone Procedure Date: 09/17/2019 9:35 AM MRN: [id number] Endoscopist: Docia Chuck. Henrene Pastor , MD Age: 73 Referring MD:  Date of Birth: [birth year] Gender: Male Account #: [id number] Procedure:                Colonoscopy Indications:              Screening for colorectal malignant neoplasm.                            Previous examinations 2004 and 2010 both negative                            for neoplasia Medicines:                Monitored Anesthesia Care Procedure:                Pre-Anesthesia Assessment:                           - Prior to the procedure, a History and Physical                            was performed, and patient medications and                            allergies were reviewed. The patient's tolerance of                            previous anesthesia was also reviewed. The risks                            and benefits of the procedure and the sedation                            options and risks were discussed with the patient.                            All questions were answered, and informed consent                            was obtained. Prior Anticoagulants: The patient has                            taken no previous anticoagulant or antiplatelet                            agents. ASA Grade Assessment: II - A patient with                            mild systemic disease. After reviewing the risks                            and benefits, the patient was deemed in  satisfactory condition to undergo the procedure.                           After obtaining informed consent, the colonoscope                            was passed under direct vision. Throughout the                            procedure, the patient's blood pressure, pulse, and                            oxygen saturations were monitored continuously. The                            Colonoscope was introduced through the anus and                             advanced to the the cecum, identified by                            appendiceal orifice and ileocecal valve. The                            ileocecal valve, appendiceal orifice, and rectum                            were photographed. The quality of the bowel                            preparation was good. The colonoscopy was performed                            without difficulty. The patient tolerated the                            procedure well. The bowel preparation used was                            SUPREP via split dose instruction. Scope In: 9:40:57 AM Scope Out: 9:52:43 AM Scope Withdrawal Time: 0 hours 7 minutes 8 seconds  Total Procedure Duration: 0 hours 11 minutes 46 seconds  Findings:                 Many small and large-mouthed diverticula were found                            in the transverse colon and left colon.                           Internal hemorrhoids were found during retroflexion.                           The exam was otherwise without abnormality on  direct and retroflexion views. Complications:            No immediate complications. Estimated blood loss:                            None. Estimated Blood Loss:     Estimated blood loss: none. Impression:               - Diverticulosis in the transverse colon and in the                            left colon.                           - Internal hemorrhoids.                           - The examination was otherwise normal on direct                            and retroflexion views.                           - No specimens collected. Recommendation:           - Repeat colonoscopy is not recommended for                            screening purposes.                           - Patient has a contact number available for                            emergencies. The signs and symptoms of potential                            delayed complications were discussed with the                             patient. Return to normal activities tomorrow.                            Written discharge instructions were provided to the                            patient.                           - Resume previous diet.                           - Continue present medications. Docia Chuck. Henrene Pastor, MD 09/17/2019 9:57:15 AM This report has been signed electronically.

## 2019-09-17 NOTE — Progress Notes (Signed)
Pt's states no medical or surgical changes since previsit or office visit.  Temp taken by KA VS taken by CW

## 2019-09-19 ENCOUNTER — Telehealth: Payer: Self-pay

## 2019-09-19 NOTE — Telephone Encounter (Signed)
LVM

## 2019-09-19 NOTE — Telephone Encounter (Signed)
  Follow up Call-  Call back number 09/17/2019  Post procedure Call Back phone  # [phone]  Permission to leave phone message Yes  Some recent data might be hidden     Patient questions:  Do you have a fever, pain , or abdominal swelling? No. Pain Score  0 *  Have you tolerated food without any problems? Yes.    Have you been able to return to your normal activities? Yes.    Do you have any questions about your discharge instructions: Diet   No. Medications  No. Follow up visit  No.  Do you have questions or concerns about your Care? No.  Actions: * If pain score is 4 or above: No action needed, pain <4.  1. Have you developed a fever since your procedure? no  2.   Have you had an respiratory symptoms (SOB or cough) since your procedure? no  3.   Have you tested positive for COVID 19 since your procedure no  4.   Have you had any family members/close contacts diagnosed with the COVID 19 since your procedure?  no   If yes to any of these questions please route to Joylene John, RN and Alphonsa Gin, Therapist, sports.

## 2019-10-08 ENCOUNTER — Telehealth: Payer: Self-pay | Admitting: Family Medicine

## 2019-10-08 NOTE — Telephone Encounter (Signed)
CALLEL LVM FOR PT TO C/B  PT TO RESCHEDULE HIS PHYSICAL FROM 12.4.2020 PROVIDER OUT OF OFFICE THAT DAY FR

## 2019-10-24 ENCOUNTER — Telehealth: Payer: Self-pay | Admitting: Family Medicine

## 2019-10-24 NOTE — Telephone Encounter (Signed)
cancelled provider not available LVM for pt to C/B and reschedule physical FR

## 2019-11-16 ENCOUNTER — Encounter: Payer: Medicare Other | Admitting: Family Medicine

## 2019-11-21 ENCOUNTER — Telehealth: Payer: Self-pay | Admitting: *Deleted

## 2019-11-21 NOTE — Telephone Encounter (Signed)
SCHEDULE AWV 

## 2019-11-27 ENCOUNTER — Telehealth: Payer: Self-pay | Admitting: *Deleted

## 2019-11-27 ENCOUNTER — Other Ambulatory Visit: Payer: Self-pay | Admitting: Family Medicine

## 2019-11-27 ENCOUNTER — Ambulatory Visit (INDEPENDENT_AMBULATORY_CARE_PROVIDER_SITE_OTHER): Payer: Medicare Other | Admitting: Family Medicine

## 2019-11-27 ENCOUNTER — Other Ambulatory Visit: Payer: Self-pay | Admitting: *Deleted

## 2019-11-27 VITALS — BP 105/64 | Ht 71.5 in | Wt 210.0 lb

## 2019-11-27 DIAGNOSIS — Z0000 Encounter for general adult medical examination without abnormal findings: Secondary | ICD-10-CM

## 2019-11-27 DIAGNOSIS — E782 Mixed hyperlipidemia: Secondary | ICD-10-CM

## 2019-11-27 DIAGNOSIS — J310 Chronic rhinitis: Secondary | ICD-10-CM

## 2019-11-27 DIAGNOSIS — R609 Edema, unspecified: Secondary | ICD-10-CM

## 2019-11-27 MED ORDER — PRAVASTATIN SODIUM 40 MG PO TABS: 40.0000 mg | ORAL_TABLET | Freq: Every day | ORAL | 0 refills | Status: DC

## 2019-11-27 MED ORDER — FLUTICASONE PROPIONATE 50 MCG/ACT NA SUSP: 2.0000 | Freq: Every day | NASAL | 0 refills | Status: AC

## 2019-11-27 NOTE — Patient Instructions (Addendum)
Thank you for taking time to come for your Medicare Wellness Visit. I appreciate your ongoing commitment to your health goals. Please review the following plan we discussed and let me know if I can assist you in the future.  Jonathan Latulippe LPN  Preventive Care 65 Years and Older, Male Preventive care refers to lifestyle choices and visits with your health care provider that can promote health and wellness. This includes:  A yearly physical exam. This is also called an annual well check.  Regular dental and eye exams.  Immunizations.  Screening for certain conditions.  Healthy lifestyle choices, such as diet and exercise. What can I expect for my preventive care visit? Physical exam Your health care provider will check:  Height and weight. These may be used to calculate body mass index (BMI), which is a measurement that tells if you are at a healthy weight.  Heart rate and blood pressure.  Your skin for abnormal spots. Counseling Your health care provider may ask you questions about:  Alcohol, tobacco, and drug use.  Emotional well-being.  Home and relationship well-being.  Sexual activity.  Eating habits.  History of falls.  Memory and ability to understand (cognition).  Work and work environment. What immunizations do I need?  Influenza (flu) vaccine  This is recommended every year. Tetanus, diphtheria, and pertussis (Tdap) vaccine  You may need a Td booster every 10 years. Varicella (chickenpox) vaccine  You may need this vaccine if you have not already been vaccinated. Zoster (shingles) vaccine  You may need this after age 60. Pneumococcal conjugate (PCV13) vaccine  One dose is recommended after age 65. Pneumococcal polysaccharide (PPSV23) vaccine  One dose is recommended after age 65. Measles, mumps, and rubella (MMR) vaccine  You may need at least one dose of MMR if you were born in 1957 or later. You may also need a second dose. Meningococcal  conjugate (MenACWY) vaccine  You may need this if you have certain conditions. Hepatitis A vaccine  You may need this if you have certain conditions or if you travel or work in places where you may be exposed to hepatitis A. Hepatitis B vaccine  You may need this if you have certain conditions or if you travel or work in places where you may be exposed to hepatitis B. Haemophilus influenzae type b (Hib) vaccine  You may need this if you have certain conditions. You may receive vaccines as individual doses or as more than one vaccine together in one shot (combination vaccines). Talk with your health care provider about the risks and benefits of combination vaccines. What tests do I need? Blood tests  Lipid and cholesterol levels. These may be checked every 5 years, or more frequently depending on your overall health.  Hepatitis C test.  Hepatitis B test. Screening  Lung cancer screening. You may have this screening every year starting at age 55 if you have a 30-pack-year history of smoking and currently smoke or have quit within the past 15 years.  Colorectal cancer screening. All adults should have this screening starting at age 50 and continuing until age 75. Your health care provider may recommend screening at age 45 if you are at increased risk. You will have tests every 1-10 years, depending on your results and the type of screening test.  Prostate cancer screening. Recommendations will vary depending on your family history and other risks.  Diabetes screening. This is done by checking your blood sugar (glucose) after you have not eaten for   a while (fasting). You may have this done every 1-3 years.  Abdominal aortic aneurysm (AAA) screening. You may need this if you are a current or former smoker.  Sexually transmitted disease (STD) testing. Follow these instructions at home: Eating and drinking  Eat a diet that includes fresh fruits and vegetables, whole grains, lean  protein, and low-fat dairy products. Limit your intake of foods with high amounts of sugar, saturated fats, and salt.  Take vitamin and mineral supplements as recommended by your health care provider.  Do not drink alcohol if your health care provider tells you not to drink.  If you drink alcohol: ? Limit how much you have to 0-2 drinks a day. ? Be aware of how much alcohol is in your drink. In the [place] one drink equals one 12 oz bottle of beer (355 mL), one 5 oz glass of wine (148 mL), or one 1 oz glass of hard liquor (44 mL). Lifestyle  Take daily care of your teeth and gums.  Stay active. Exercise for at least 30 minutes on 5 or more days each week.  Do not use any products that contain nicotine or tobacco, such as cigarettes, e-cigarettes, and chewing tobacco. If you need help quitting, ask your health care provider.  If you are sexually active, practice safe sex. Use a condom or other form of protection to prevent STIs (sexually transmitted infections).  Talk with your health care provider about taking a low-dose aspirin or statin. What's next?  Visit your health care provider once a year for a well check visit.  Ask your health care provider how often you should have your eyes and teeth checked.  Stay up to date on all vaccines. This information is not intended to replace advice given to you by your health care provider. Make sure you discuss any questions you have with your health care provider. Document Released: 12/26/2015 Document Revised: 11/23/2018 Document Reviewed: 11/23/2018 Elsevier Patient Education  2020 Elsevier Inc.  

## 2019-11-27 NOTE — Telephone Encounter (Signed)
Dr. Kaleen Mask,  Mr Jonathan Bridges is coming in for an appointment 1/26 I have refilled his prescriptions but he states his hydrochlorothiazide he is now taking 1 pill daily instead of 0.5 and this has solved his swelling issue.  Can you change the dosing and resend it.  thanks

## 2019-11-27 NOTE — Progress Notes (Signed)
Presents today for [employer] Visit   Date of last exam: 04/13/2019  Interpreter used for this visit? No  I connected with  Jonathan Bridges on 11/27/19 by telephone and verified that I am speaking with the correct person using two identifiers.   I discussed the limitations of evaluation and management by telemedicine. The patient expressed understanding and agreed to proceed.   Patient Care Team: Forrest Moron, MD as PCP - General (Internal Medicine) Irene Shipper, MD (Gastroenterology) Roseanne Kaufman, MD (Orthopedic Surgery) Stanford Breed Denice Bors, MD (Cardiology) Ralene Bathe, MD as Consulting Physician (Ophthalmology)   Other items to address today:  Discussed Eye/Dental Discussed immunizations Follow up scheduled 01/07/2019    Other Screening:  Last lipid screening:  04/13/2019  ADVANCE DIRECTIVES: Discussed: yes On File: no copy requested Materials Provided: no   Immunization status:  Immunization History  Administered Date(s) Administered  . Hepatitis A 12/03/2009  . Influenza, High Dose Seasonal PF 09/13/2018  . Influenza,inj,Quad PF,6+ Mos 09/19/2013, 09/20/2014, 09/24/2015, 10/13/2016, 10/17/2017  . Pneumococcal Conjugate-13 09/24/2015  . Pneumococcal Polysaccharide-23 09/21/2003, 09/20/2014  . Tdap 07/22/2010  . Zoster 07/14/2011     Health Maintenance Due  Topic Date Due  . INFLUENZA VACCINE  07/14/2019     Functional Status Survey: Is the patient deaf or have difficulty hearing?: No Does the patient have difficulty seeing, even when wearing glasses/contacts?: No Does the patient have difficulty concentrating, remembering, or making decisions?: No Does the patient have difficulty walking or climbing stairs?: No Does the patient have difficulty dressing or bathing?: No Does the patient have difficulty doing errands alone such as visiting a doctor's office or shopping?: No   6CIT Screen 11/27/2019  What Year? 0 points  What  month? 0 points  What time? 0 points  Count back from 20 0 points  Months in reverse 0 points  Repeat phrase 0 points  Total Score 0        Clinical Support from 11/27/2019 in [facility]  AUDIT-C Score  0       Home Environment:   Lives in a two story No trouble climbing stairs No scattered rugs No grab bars Adequate lighting/ no clutter  Timed get up N/A   Patient Active Problem List   Diagnosis Date Noted  . Superficial thrombophlebitis 08/01/2018  . ED (erectile dysfunction) 08/09/2012  . Sleep apnea   . Hypercholesterolemia   . Chronic rhinitis   . Hyperlipidemia 02/29/2012     Past Medical History:  Diagnosis Date  . Anxiety   . Basal cell carcinoma   . Chronic rhinitis   . Clotting disorder (HCC)    right leg   . Depression   . Diverticulosis   . Elevated LFTs   . History of hypertension   . Hypercholesterolemia   . Sleep apnea      Past Surgical History:  Procedure Laterality Date  . COLONOSCOPY    . WRIST SURGERY  01/24/2003   Right- ORIF: Dr. Amedeo Plenty     Family History  Problem Relation Age of Onset  . Diabetes Father   . Colon polyps Neg Hx   . Colon cancer Neg Hx   . Esophageal cancer Neg Hx   . Rectal cancer Neg Hx   . Prostate cancer Neg Hx      Social History   Socioeconomic History  . Marital status: Married    Spouse name: Not on file  . Number of children:  Not on file  . Years of education: Not on file  . Highest education level: Not on file  Occupational History  . Occupation: Architectural technologist: [employer]  Tobacco Use  . Smoking status: Never Smoker  . Smokeless tobacco: Never Used  Substance and Sexual Activity  . Alcohol use: No  . Drug use: No  . Sexual activity: Yes  Other Topics Concern  . Not on file  Social History Narrative   Exercise walking         Social Determinants of Health   Financial Resource Strain:   . Difficulty of Paying Living Expenses: Not on file  Food  Insecurity:   . Worried About Charity fundraiser in the Last Year: Not on file  . Ran Out of Food in the Last Year: Not on file  Transportation Needs:   . Lack of Transportation (Medical): Not on file  . Lack of Transportation (Non-Medical): Not on file  Physical Activity:   . Days of Exercise per Week: Not on file  . Minutes of Exercise per Session: Not on file  Stress:   . Feeling of Stress : Not on file  Social Connections:   . Frequency of Communication with Friends and Family: Not on file  . Frequency of Social Gatherings with Friends and Family: Not on file  . Attends Religious Services: Not on file  . Active Member of Clubs or Organizations: Not on file  . Attends Archivist Meetings: Not on file  . Marital Status: Not on file  Intimate Partner Violence:   . Fear of Current or Ex-Partner: Not on file  . Emotionally Abused: Not on file  . Physically Abused: Not on file  . Sexually Abused: Not on file     No Known Allergies   Prior to Admission medications   Medication Sig Start Date End Date Taking? Authorizing Provider  aspirin (ASPIRIN EC) 81 MG EC tablet Take 81 mg by mouth daily. Swallow whole.   Yes [provider]  cholecalciferol (VITAMIN D) 1000 UNITS tablet Take 1,000 Units by mouth daily.   Yes [provider]  fluticasone (FLONASE) 50 MCG/ACT nasal spray Place 2 sprays daily into both nostrils. 10/17/17  Yes Stallings, Zoe A, MD  hydrochlorothiazide (HYDRODIURIL) 25 MG tablet Take 0.5 tablets (12.5 mg total) by mouth daily. 07/20/19  Yes Forrest Moron, MD  Multiple Vitamin (MULTIVITAMIN) tablet Take 1 tablet by mouth daily.   Yes [provider]  OMEGA-3 FATTY ACIDS PO Take by mouth. 2 gummies daily   Yes [provider]  pravastatin (PRAVACHOL) 40 MG tablet Take 1 tablet (40 mg total) by mouth daily. 04/16/19  Yes Stallings, Zoe A, MD  Pyridoxine HCl (VITAMIN B-6 PO) Take by mouth.   Yes [provider]    thiamine (VITAMIN B-1) 100 MG tablet Take 100 mg daily by mouth.   Yes [provider]  acetaminophen (TYLENOL 8 HOUR ARTHRITIS PAIN) 650 MG CR tablet Take 650 mg by mouth every 8 (eight) hours as needed for pain.    [provider]  aspirin EC 325 MG tablet Take 1 tablet (325 mg total) by mouth daily. Patient not taking: Reported on 11/27/2019 07/19/18   Forrest Moron, MD     Depression screen [facility] 2/9 11/27/2019 04/16/2019 10/18/2018 10/13/2018 09/13/2018  Decreased Interest 0 0 0 0 0  Down, Depressed, Hopeless 0 0 0 0 0  PHQ - 2 Score 0 0  0 0 0     Fall Risk  11/27/2019 04/16/2019 10/18/2018 10/13/2018 09/13/2018  Falls in the past year? 0 0 0 0 No  Number falls in past yr: 0 0 - - -  Injury with Fall? 0 0 - - -  Risk for fall due to : History of fall(s) - - - -  Follow up Falls evaluation completed;Education provided - - - -      PHYSICAL EXAM: BP 105/64 Comment: taken from previous visit  Ht 5' 11.5" (1.816 m)   Wt 210 lb (95.3 kg) Comment: per patient  BMI 28.88 kg/m    Wt Readings from Last 3 Encounters:  11/27/19 210 lb (95.3 kg)  09/17/19 215 lb (97.5 kg)  09/03/19 215 lb (97.5 kg)      Education/Counseling provided regarding diet and exercise, prevention of chronic diseases, smoking/tobacco cessation, if applicable, and reviewed "Covered Medicare Preventive Services."

## 2019-11-28 NOTE — Telephone Encounter (Signed)
Message sent

## 2019-12-20 ENCOUNTER — Encounter: Payer: Medicare Other | Admitting: Family Medicine

## 2019-12-20 MED ORDER — HYDROCHLOROTHIAZIDE 25 MG PO TABS: 25.0000 mg | ORAL_TABLET | Freq: Every day | ORAL | 1 refills | Status: AC

## 2019-12-20 NOTE — Telephone Encounter (Signed)
New dose sent in. Please notify the patient.

## 2019-12-20 NOTE — Addendum Note (Signed)
Addended by: Delia Chimes A on: 12/20/2019 02:00 AM   Modules accepted: Orders

## 2020-01-08 ENCOUNTER — Ambulatory Visit (INDEPENDENT_AMBULATORY_CARE_PROVIDER_SITE_OTHER): Payer: Medicare PPO | Admitting: Family Medicine

## 2020-01-08 ENCOUNTER — Encounter: Payer: Self-pay | Admitting: Family Medicine

## 2020-01-08 ENCOUNTER — Other Ambulatory Visit: Payer: Self-pay

## 2020-01-08 VITALS — BP 124/80 | HR 87 | Temp 97.8°F | Ht 71.5 in | Wt 212.2 lb

## 2020-01-08 DIAGNOSIS — M25511 Pain in right shoulder: Secondary | ICD-10-CM | POA: Diagnosis not present

## 2020-01-08 DIAGNOSIS — Z0000 Encounter for general adult medical examination without abnormal findings: Secondary | ICD-10-CM

## 2020-01-08 DIAGNOSIS — Z125 Encounter for screening for malignant neoplasm of prostate: Secondary | ICD-10-CM | POA: Diagnosis not present

## 2020-01-08 DIAGNOSIS — G8929 Other chronic pain: Secondary | ICD-10-CM | POA: Diagnosis not present

## 2020-01-08 DIAGNOSIS — E782 Mixed hyperlipidemia: Secondary | ICD-10-CM

## 2020-01-08 DIAGNOSIS — Z0001 Encounter for general adult medical examination with abnormal findings: Secondary | ICD-10-CM | POA: Diagnosis not present

## 2020-01-08 NOTE — Progress Notes (Signed)
QUICK REFERENCE INFORMATION: The ABCs of Providing the Annual Wellness Visit  CMS.gov Medicare Learning Network  [employer] Annual Wellness Visit  Subjective:   Jonathan Bridges is a 74 y.o. Male who presents for an Annual Wellness Visit.  Patient Active Problem List   Diagnosis Date Noted  . ED (erectile dysfunction) 08/09/2012  . Hypercholesterolemia   . Chronic rhinitis   . Hyperlipidemia 02/29/2012    Past Medical History:  Diagnosis Date  . Anxiety   . Basal cell carcinoma   . Chronic rhinitis   . Clotting disorder (HCC)    right leg   . Depression   . Diverticulosis   . Elevated LFTs   . History of hypertension   . Hypercholesterolemia   . Sleep apnea      Past Surgical History:  Procedure Laterality Date  . COLONOSCOPY    . WRIST SURGERY  01/24/2003   Right- ORIF: Dr. Amedeo Plenty     Outpatient Medications Prior to Visit  Medication Sig Dispense Refill  . acetaminophen (TYLENOL 8 HOUR ARTHRITIS PAIN) 650 MG CR tablet Take 650 mg by mouth every 8 (eight) hours as needed for pain.    Marland Kitchen aspirin (ASPIRIN EC) 81 MG EC tablet Take 81 mg by mouth daily. Swallow whole.    . fluticasone (FLONASE) 50 MCG/ACT nasal spray Place 2 sprays into both nostrils daily. 16 g 0  . hydrochlorothiazide (HYDRODIURIL) 25 MG tablet Take 1 tablet (25 mg total) by mouth daily. 90 tablet 1  . Multiple Vitamin (MULTIVITAMIN) tablet Take 1 tablet by mouth daily.    . OMEGA-3 FATTY ACIDS PO Take by mouth. 2 gummies daily    . pravastatin (PRAVACHOL) 40 MG tablet Take 1 tablet (40 mg total) by mouth daily. 90 tablet 0  . Pyridoxine HCl (VITAMIN B-6 PO) Take by mouth.    . thiamine (VITAMIN B-1) 100 MG tablet Take 100 mg daily by mouth.    Marland Kitchen aspirin EC 325 MG tablet Take 1 tablet (325 mg total) by mouth daily. 30 tablet 6  . loratadine (KLS ALLERCLEAR) 10 MG tablet     . cholecalciferol (VITAMIN D) 1000 UNITS tablet Take 1,000 Units by mouth daily.     Facility-Administered Medications Prior to  Visit  Medication Dose Route Frequency Provider Last Rate Last Admin  . 0.9 %  sodium chloride infusion  500 mL Intravenous Once Irene Shipper, MD        No Known Allergies   Family History  Problem Relation Age of Onset  . Diabetes Father   . Colon polyps Neg Hx   . Colon cancer Neg Hx   . Esophageal cancer Neg Hx   . Rectal cancer Neg Hx   . Prostate cancer Neg Hx      Social History   Socioeconomic History  . Marital status: Married    Spouse name: Not on file  . Number of children: Not on file  . Years of education: Not on file  . Highest education level: Not on file  Occupational History  . Occupation: Architectural technologist: [employer]  Tobacco Use  . Smoking status: Never Smoker  . Smokeless tobacco: Never Used  Substance and Sexual Activity  . Alcohol use: No  . Drug use: No  . Sexual activity: Yes  Other Topics Concern  . Not on file  Social History Narrative   Exercise walking         Social Determinants of Health   Financial  Resource Strain:   . Difficulty of Paying Living Expenses: Not on file  Food Insecurity:   . Worried About Charity fundraiser in the Last Year: Not on file  . Ran Out of Food in the Last Year: Not on file  Transportation Needs:   . Lack of Transportation (Medical): Not on file  . Lack of Transportation (Non-Medical): Not on file  Physical Activity:   . Days of Exercise per Week: Not on file  . Minutes of Exercise per Session: Not on file  Stress:   . Feeling of Stress : Not on file  Social Connections:   . Frequency of Communication with Friends and Family: Not on file  . Frequency of Social Gatherings with Friends and Family: Not on file  . Attends Religious Services: Not on file  . Active Member of Clubs or Organizations: Not on file  . Attends Archivist Meetings: Not on file  . Marital Status: Not on file      Recent Hospitalizations? No  Health Habits: Current exercise activities include:  walking Exercise: 3 times/week. Diet: in general, a "healthy" diet    Alcohol intake: rare  Health Risk Assessment: The patient has completed a Health Risk Assessment. This has been reveiwed with them and has been scanned into the [facility] system as an attached document.  Current Medical Providers and Suppliers: [facility] Patient Care Team: Forrest Moron, MD as PCP - General (Internal Medicine) Irene Shipper, MD (Gastroenterology) Roseanne Kaufman, MD (Orthopedic Surgery) Lelon Perla, MD (Cardiology) Ralene Bathe, MD as Consulting Physician (Ophthalmology) No future appointments.   Age-appropriate Screening Schedule: The list below includes current immunization status and future screening recommendations based on patient's age. Orders for these recommended tests are listed in the plan section. The patient has been provided with a written plan. Immunization History  Administered Date(s) Administered  . Fluad Quad(high Dose 65+) 12/24/2019  . Hepatitis A 12/03/2009  . Influenza, High Dose Seasonal PF 09/13/2018  . Influenza,inj,Quad PF,6+ Mos 09/19/2013, 09/20/2014, 09/24/2015, 10/13/2016, 10/17/2017  . Pneumococcal Conjugate-13 09/24/2015  . Pneumococcal Polysaccharide-23 09/21/2003, 09/20/2014  . Tdap 07/22/2010  . Zoster 07/14/2011  . Zoster Recombinat (Shingrix) 01/03/2019, 05/02/2019    Health Maintenance reviewed -  Will get PSA    Depression Screen-PHQ2/9 completed today  Depression screen [facility] 2/9 01/08/2020 11/27/2019 04/16/2019 10/18/2018 10/13/2018  Decreased Interest 0 0 0 0 0  Down, Depressed, Hopeless 0 0 0 0 0  PHQ - 2 Score 0 0 0 0 0       Depression Severity and Treatment Recommendations:  0-4= None  5-9= Mild / Treatment: Support, educate to call if worse; return in one month  10-14= Moderate / Treatment: Support, watchful waiting; Antidepressant or Psycotherapy  15-19= Moderately severe / Treatment: Antidepressant OR Psychotherapy  >= 20 = Major  depression, severe / Antidepressant AND Psychotherapy  Functional Status Survey: Is the patient deaf or have difficulty hearing?: No Does the patient have difficulty seeing, even when wearing glasses/contacts?: No Does the patient have difficulty concentrating, remembering, or making decisions?: No Does the patient have difficulty walking or climbing stairs?: No Does the patient have difficulty dressing or bathing?: No Does the patient have difficulty doing errands alone such as visiting a doctor's office or shopping?: No    Advanced Care Planning: 1. Patient has executed an Advance Directive: No 2. If no, patient was given the opportunity to execute an Advance Directive today? n/a 3. Are the patient's advanced directives in  [place]? yes 4. This patient has the ability to prepare an Advance Directive: yes 5. Provider is willing to follow the patient's wishes: yes  Cognitive Assessment: Does the patient have evidence of cognitive impairment? No The patient does not have any evidence of any cognitive problems and denies any  change in mood/affect, appearance, speech, memory or motor skills.  Identification of Risk Factors: Risk factors include: hyperlipidemia and hypertension  ROS   Review of Systems  Constitutional: Negative for activity change, appetite change, chills and fever.  HENT: Negative for congestion, nosebleeds, trouble swallowing and voice change.   Respiratory: Negative for cough, shortness of breath and wheezing.   Gastrointestinal: Negative for diarrhea, nausea and vomiting.  Genitourinary: Negative for difficulty urinating, dysuria, flank pain and hematuria.  Musculoskeletal: Negative for back pain, joint swelling and neck pain.  Neurological: Negative for dizziness, speech difficulty, light-headedness and numbness.  See HPI. All other review of systems negative.    Objective:   Vitals:   01/08/20 0820  BP: 124/80  Pulse: 87  Temp: 97.8 F (36.6 C)  SpO2:  98%  Weight: 212 lb 3.2 oz (96.3 kg)  Height: 5' 11.5" (1.816 m)   Wt Readings from Last 3 Encounters:  01/08/20 212 lb 3.2 oz (96.3 kg)  11/27/19 210 lb (95.3 kg)  09/17/19 215 lb (97.5 kg)     Body mass index is 29.18 kg/m.  Physical Exam  Constitutional: Oriented to person, place, and time. Appears well-developed and well-nourished.  HENT:  Head: Normocephalic and atraumatic.  Eyes: Conjunctivae and EOM are normal.  Cardiovascular: Normal rate, regular rhythm, normal heart sounds and intact distal pulses.  No murmur heard. Pulmonary/Chest: Effort normal and breath sounds normal. No stridor. No respiratory distress. Has no wheezes.  Abdomen: non-distended, normoactive bs, soft, non-tender Neurological: Is alert and oriented to person, place, and time.  Skin: Skin is warm. Capillary refill takes less than 2 seconds.  Psychiatric: Has a normal mood and affect. Behavior is normal. Judgment and thought content normal.   Shoulder exam - right No crepitus No effusion or edema Full range of motion but noticeable drop with internal rotation    Assessment/Plan:   Patient Self-Management and Personalized Health Advice The patient has been provided with information about:  reduce exposure to stress and continue current medications  During the course of the visit the patient was educated and counseled about appropriate screening and preventive services including:   return annually or prn     Body mass index is 29.18 kg/m. Discussed the patient's BMI with him. The BMI BMI is in the acceptable range  Lennix was seen today for follow-up.  Diagnoses and all orders for this visit:  Encounter for Medicare annual wellness exam - continue exercise, dental exam, healthy diet, stress management and current meds   Mixed hyperlipidemia -     Lipid panel -     CMP14+EGFR  Screening for prostate cancer -     PSA  Chronic right shoulder pain -     Ambulatory referral to  Orthopedic Surgery      No follow-ups on file.  No future appointments.  Patient Instructions       If you have lab work done today you will be contacted with your lab results within the next 2 weeks.  If you have not heard from [place] then please contact us. The fastest way to get your results is to register for My Chart.   IF you received an x-ray today,  you will receive an invoice from [facility]. Please contact [facility] at [phone] with questions or concerns regarding your invoice.   IF you received labwork today, you will receive an invoice from [place]. Please contact [facility] at [phone] with questions or concerns regarding your invoice.   Our billing staff will not be able to assist you with questions regarding bills from these companies.  You will be contacted with the lab results as soon as they are available. The fastest way to get your results is to activate your My Chart account. Instructions are located on the last page of this paperwork. If you have not heard from [place] regarding the results in 2 weeks, please contact this office.       An after visit summary with all of these plans was given to the patient.

## 2020-01-08 NOTE — Patient Instructions (Signed)
° ° ° °  If you have lab work done today you will be contacted with your lab results within the next 2 weeks.  If you have not heard from us then please contact us. The fastest way to get your results is to register for My Chart. ° ° °IF you received an x-ray today, you will receive an invoice from [facility]. Please contact [facility] at [phone] with questions or concerns regarding your invoice.  ° °IF you received labwork today, you will receive an invoice from [facility]. Please contact [facility] at [phone] with questions or concerns regarding your invoice.  ° °Our billing staff will not be able to assist you with questions regarding bills from these companies. ° °You will be contacted with the lab results as soon as they are available. The fastest way to get your results is to activate your My Chart account. Instructions are located on the last page of this paperwork. If you have not heard from us regarding the results in 2 weeks, please contact this office. °  ° ° ° °

## 2020-01-09 LAB — CMP14+EGFR
ALT: 26 IU/L (ref 0–44)
AST: 30 IU/L (ref 0–40)
Albumin/Globulin Ratio: 1.6 (ref 1.2–2.2)
Albumin: 4.6 g/dL (ref 3.7–4.7)
Alkaline Phosphatase: 78 IU/L (ref 39–117)
BUN/Creatinine Ratio: 15 (ref 10–24)
BUN: 17 mg/dL (ref 8–27)
Bilirubin Total: 0.5 mg/dL (ref 0.0–1.2)
CO2: 25 mmol/L (ref 20–29)
Calcium: 9.7 mg/dL (ref 8.6–10.2)
Chloride: 100 mmol/L (ref 96–106)
Creatinine, Ser: 1.12 mg/dL (ref 0.76–1.27)
GFR calc Af Amer: 76 mL/min/{1.73_m2} (ref >59)
GFR calc non Af Amer: 66 mL/min/{1.73_m2} (ref >59)
Globulin, Total: 2.9 g/dL (ref 1.5–4.5)
Glucose: 96 mg/dL (ref 65–99)
Potassium: 4.8 mmol/L (ref 3.5–5.2)
Sodium: 139 mmol/L (ref 134–144)
Total Protein: 7.5 g/dL (ref 6.0–8.5)

## 2020-01-09 LAB — LIPID PANEL
Chol/HDL Ratio: 4 ratio (ref 0.0–5.0)
Cholesterol, Total: 226 mg/dL — ABNORMAL HIGH (ref 100–199)
HDL: 57 mg/dL (ref >39)
LDL Chol Calc (NIH): 149 mg/dL — ABNORMAL HIGH (ref 0–99)
Triglycerides: 111 mg/dL (ref 0–149)
VLDL Cholesterol Cal: 20 mg/dL (ref 5–40)

## 2020-01-09 LAB — PSA: Prostate Specific Ag, Serum: 0.6 ng/mL (ref 0.0–4.0)

## 2020-01-12 ENCOUNTER — Encounter: Payer: Self-pay | Admitting: Family Medicine

## 2020-01-15 ENCOUNTER — Other Ambulatory Visit: Payer: Self-pay

## 2020-01-15 ENCOUNTER — Ambulatory Visit: Payer: Medicare PPO | Admitting: Orthopedic Surgery

## 2020-01-15 ENCOUNTER — Ambulatory Visit: Payer: Self-pay

## 2020-01-15 DIAGNOSIS — M6281 Muscle weakness (generalized): Secondary | ICD-10-CM

## 2020-01-15 DIAGNOSIS — S46011A Strain of muscle(s) and tendon(s) of the rotator cuff of right shoulder, initial encounter: Secondary | ICD-10-CM

## 2020-01-15 DIAGNOSIS — G8929 Other chronic pain: Secondary | ICD-10-CM

## 2020-01-15 NOTE — Progress Notes (Signed)
Office Visit Note   Patient: Jonathan Bridges           Date of Birth: 08-29-1946           MRN: [id number] Visit Date: 01/15/2020 Requested by: Forrest Moron, MD [place],  [place] [id number] PCP: Forrest Moron, MD  Subjective: Chief Complaint  Patient presents with  . Right Shoulder - Pain    HPI: Jonathan Bridges is a 74 y.o. male who presents to the office complaining of right shoulder pain.  Patient notes that he has been dealing with right shoulder pain since September 2019 when he was injured while [place] the airport.  He localizes the majority of his pain to the lateral aspect of the right shoulder and notes that pain is always there but it comes and goes in intensity.  Pain depends on the position of his arm.  It does not wake him up at night.  He has tried over-the-counter Tylenol with some relief as well as occasional NSAID use.  He has no history of shoulder surgery or injection.  Denies any neck pain, numbness, tingling.  He does note some subjective weakness of the right arm.  His pain is worse when his arm is by his side..                ROS:  All systems reviewed are negative as they relate to the chief complaint within the history of present illness.  Patient denies fevers or chills.  Assessment & Plan: Visit Diagnoses:  1. Chronic right shoulder pain   2. Muscle weakness of right upper extremity     Plan: Patient is a 74 year old male who presents complaining of right shoulder pain.  It is been ongoing since September 2019.  He has never had complete relief.  He is able to lift his arm and functional uses arm well but he does have some subjective weakness when coming out and away from his body and trying to lift things.  On exam he has excellent range of motion but does have some weakness with infraspinatus resistance testing.  Radiographs are negative for any pathology to explain patient's symptoms.  Discussed  options available to patient.  Since he has been dealing with this for such a long time and has weakness on exam, an MRI arthrogram of the right shoulder was ordered to evaluate for right rotator cuff tear.  Additionally, a subacromial injection of the right shoulder was administered.  Patient tolerated this procedure well.  He will follow-up after MRI to review results.    Follow-Up Instructions: No follow-ups on file.   Orders:  Orders Placed This Encounter  Procedures  . XR Shoulder Right   No orders of the defined types were placed in this encounter.     Procedures: Large Joint Inj: R subacromial bursa on 01/20/2020 8:30 AM Indications: diagnostic evaluation and pain Details: 18 G 1.5 in needle, posterior approach  Arthrogram: No  Medications: 9 mL bupivacaine 0.5 %; 40 mg methylPREDNISolone acetate 40 MG/ML; 5 mL lidocaine 1 % Outcome: tolerated well, no immediate complications Procedure, treatment alternatives, risks and benefits explained, specific risks discussed. Consent was given by the patient. Immediately prior to procedure a time out was called to verify the correct patient, procedure, equipment, support staff and site/side marked as required. Patient was prepped and draped in the usual sterile fashion.       Clinical  Data: No additional findings.  Objective: Vital Signs: There were no vitals taken for this visit.  Physical Exam:  Constitutional: Patient appears well-developed HEENT:  Head: Normocephalic Eyes:EOM are normal Neck: Normal range of motion Cardiovascular: Normal rate Pulmonary/chest: Effort normal Neurologic: Patient is alert Skin: Skin is warm Psychiatric: Patient has normal mood and affect  Ortho Exam:  Right shoulder Exam Able to fully forward flex and abduct shoulder overhead No loss of ER relative to the other shoulder.  Good endpoint with ER No TTP over the [facility] joint or bicipital groove 5/5 motor strength of the subscapularis and  supraspinatus muscles.  4/5 motor strength of the infraspinatus muscle Negative Hawkins impingement 5/5 grip strength, forearm pronation/supination, and bicep strength  Specialty Comments:  No specialty comments available.  Imaging: No results found.   PMFS History: Patient Active Problem List   Diagnosis Date Noted  . ED (erectile dysfunction) 08/09/2012  . Hypercholesterolemia   . Chronic rhinitis   . Hyperlipidemia 02/29/2012   Past Medical History:  Diagnosis Date  . Anxiety   . Basal cell carcinoma   . Chronic rhinitis   . Clotting disorder (HCC)    right leg   . Depression   . Diverticulosis   . Elevated LFTs   . History of hypertension   . Hypercholesterolemia   . Sleep apnea     Family History  Problem Relation Age of Onset  . Diabetes Father   . Colon polyps Neg Hx   . Colon cancer Neg Hx   . Esophageal cancer Neg Hx   . Rectal cancer Neg Hx   . Prostate cancer Neg Hx     Past Surgical History:  Procedure Laterality Date  . COLONOSCOPY    . WRIST SURGERY  01/24/2003   Right- ORIF: Dr. Amedeo Plenty   Social History   Occupational History  . Occupation: Architectural technologist: [employer]  Tobacco Use  . Smoking status: Never Smoker  . Smokeless tobacco: Never Used  Substance and Sexual Activity  . Alcohol use: No  . Drug use: No  . Sexual activity: Yes

## 2020-01-18 ENCOUNTER — Other Ambulatory Visit: Payer: Self-pay | Admitting: Surgical

## 2020-01-18 ENCOUNTER — Encounter: Payer: Self-pay | Admitting: Orthopedic Surgery

## 2020-01-18 MED ORDER — MELOXICAM 15 MG PO TABS: 15.0000 mg | ORAL_TABLET | Freq: Every day | ORAL | 0 refills | Status: DC

## 2020-01-20 ENCOUNTER — Encounter: Payer: Self-pay | Admitting: Orthopedic Surgery

## 2020-01-20 DIAGNOSIS — S46011A Strain of muscle(s) and tendon(s) of the rotator cuff of right shoulder, initial encounter: Secondary | ICD-10-CM | POA: Diagnosis not present

## 2020-01-20 MED ORDER — LIDOCAINE HCL 1 % IJ SOLN
5.0000 mL | INTRAMUSCULAR | Status: AC | PRN
  Administered 2020-01-20: 09:00:00 5 mL

## 2020-01-20 MED ORDER — BUPIVACAINE HCL 0.5 % IJ SOLN
9.0000 mL | INTRAMUSCULAR | Status: AC | PRN
  Administered 2020-01-20: 09:00:00 9 mL via INTRA_ARTICULAR

## 2020-01-20 MED ORDER — METHYLPREDNISOLONE ACETATE 40 MG/ML IJ SUSP
40.0000 mg | INTRAMUSCULAR | Status: AC | PRN
  Administered 2020-01-20: 09:00:00 40 mg via INTRA_ARTICULAR

## 2020-01-21 DIAGNOSIS — Z23 Encounter for immunization: Secondary | ICD-10-CM | POA: Diagnosis not present

## 2020-01-21 DIAGNOSIS — Z85828 Personal history of other malignant neoplasm of skin: Secondary | ICD-10-CM | POA: Diagnosis not present

## 2020-01-21 DIAGNOSIS — L821 Other seborrheic keratosis: Secondary | ICD-10-CM | POA: Diagnosis not present

## 2020-01-21 DIAGNOSIS — L578 Other skin changes due to chronic exposure to nonionizing radiation: Secondary | ICD-10-CM | POA: Diagnosis not present

## 2020-01-21 DIAGNOSIS — L820 Inflamed seborrheic keratosis: Secondary | ICD-10-CM | POA: Diagnosis not present

## 2020-01-21 DIAGNOSIS — D225 Melanocytic nevi of trunk: Secondary | ICD-10-CM | POA: Diagnosis not present

## 2020-01-21 DIAGNOSIS — Z86018 Personal history of other benign neoplasm: Secondary | ICD-10-CM | POA: Diagnosis not present

## 2020-01-21 DIAGNOSIS — L814 Other melanin hyperpigmentation: Secondary | ICD-10-CM | POA: Diagnosis not present

## 2020-01-22 ENCOUNTER — Other Ambulatory Visit: Payer: Self-pay

## 2020-01-22 DIAGNOSIS — H2513 Age-related nuclear cataract, bilateral: Secondary | ICD-10-CM | POA: Diagnosis not present

## 2020-01-22 DIAGNOSIS — M25511 Pain in right shoulder: Secondary | ICD-10-CM

## 2020-01-22 DIAGNOSIS — H524 Presbyopia: Secondary | ICD-10-CM | POA: Diagnosis not present

## 2020-01-25 ENCOUNTER — Telehealth: Payer: Self-pay | Admitting: Orthopedic Surgery

## 2020-01-25 NOTE — Telephone Encounter (Signed)
Called patient left message to return call to schedule an appointment with Dr. Marlou Sa for MRI review   MRI scheduled for 02/14/2020

## 2020-01-28 ENCOUNTER — Telehealth: Payer: Self-pay | Admitting: Orthopedic Surgery

## 2020-01-28 NOTE — Telephone Encounter (Signed)
Called patient left message on voicemail to return call to schedule an MRI review appointment with Dr. Marlou Sa

## 2020-02-12 NOTE — Progress Notes (Signed)
Please mail a lab letter stating " Your labs look normal especially the prostate test, liver and kidney function. The cholesterol is borderline high."

## 2020-02-13 NOTE — Progress Notes (Signed)
Letter sent.

## 2020-02-14 ENCOUNTER — Other Ambulatory Visit: Payer: Self-pay

## 2020-02-14 ENCOUNTER — Ambulatory Visit
Admission: RE | Admit: 2020-02-14 | Discharge: 2020-02-14 | Disposition: A | Discharge status: Home / Self Care | Payer: Medicare PPO | Source: Ambulatory Visit | Attending: Orthopedic Surgery | Admitting: Orthopedic Surgery

## 2020-02-14 DIAGNOSIS — M25511 Pain in right shoulder: Secondary | ICD-10-CM

## 2020-02-14 DIAGNOSIS — S46011A Strain of muscle(s) and tendon(s) of the rotator cuff of right shoulder, initial encounter: Secondary | ICD-10-CM | POA: Diagnosis not present

## 2020-02-14 MED ORDER — IOPAMIDOL (ISOVUE-M 200) INJECTION 41%
13.0000 mL | Freq: Once | INTRAMUSCULAR | Status: AC
  Administered 2020-02-14: 13 mL via INTRA_ARTICULAR

## 2020-02-18 ENCOUNTER — Ambulatory Visit: Payer: Medicare PPO | Admitting: Orthopedic Surgery

## 2020-02-18 ENCOUNTER — Other Ambulatory Visit: Payer: Self-pay

## 2020-02-18 ENCOUNTER — Encounter: Payer: Self-pay | Admitting: Orthopedic Surgery

## 2020-02-18 DIAGNOSIS — M75101 Unspecified rotator cuff tear or rupture of right shoulder, not specified as traumatic: Secondary | ICD-10-CM | POA: Diagnosis not present

## 2020-02-22 ENCOUNTER — Encounter: Payer: Self-pay | Admitting: Orthopedic Surgery

## 2020-02-22 NOTE — Progress Notes (Signed)
Office Visit Note   Patient: Jonathan Bridges           Date of Birth: 08-12-1946           MRN: [id number] Visit Date: 02/18/2020 Requested by: Forrest Moron, MD [place],  [place] [id number] PCP: Forrest Moron, MD  Subjective: Chief Complaint  Patient presents with  . Follow-up    MRI review    HPI: Jonathan Bridges is a 74 y.o. male who presents to the office complaining of right shoulder pain.  He returns to discuss MRI results of the right shoulder.  He notes that the subacromial injection on 01/15/2020 gave relief but pain has recurred.  He is also been taking Aleve with some relief of pain.  He has no history of shoulder surgery.  Denies any cardiac history, diabetes, smoking.  Does have a history of a DVT.  MRI of the right shoulder revealed a supraspinatus tear with about 3 to 3.5 cm of the humeral attachment site             ROS:  All systems reviewed are negative as they relate to the chief complaint within the history of present illness.  Patient denies fevers or chills.  Assessment & Plan: Visit Diagnoses:  1. Tear of right supraspinatus tendon     Plan: Patient is a 74 year old male who presents complaining of right shoulder pain.  MRI of the right shoulder was obtained and revealed a full-thickness supraspi5natus tear with about 3.5 cm.  Infraspinatus with tendinosis and a labrum/biceps.  No significant degenerative changes.  Discussed options available to patient.  He does have some weakness on exam.  This has been ongoing for several years.  He wishes for this to be resolved and so we will proceed with supraspinatus tendon repair.  He will follow-up after procedure.  Risk and benefits of surgical repair are discussed with the patient.  Primary risk include infection inability to repair the tendon as well as persistent pain and loss of function.  Patient understands the risk and benefits and wishes to proceed.  All in all the shoulder looks pretty reasonable except  for that tear.  All questions answeredExpected rehab course also described Follow-Up Instructions: No follow-ups on file.   Orders:  No orders of the defined types were placed in this encounter.  No orders of the defined types were placed in this encounter.     Procedures: No procedures performed   Clinical Data: No additional findings.  Objective: Vital Signs: There were no vitals taken for this visit.  Physical Exam:  Constitutional: Patient appears well-developed HEENT:  Head: Normocephalic Eyes:EOM are normal Neck: Normal range of motion Cardiovascular: Normal rate Pulmonary/chest: Effort normal Neurologic: Patient is alert Skin: Skin is warm Psychiatric: Patient has normal mood and affect  Ortho Exam:  Right shoulder Exam Difficulty with fully forward flexing right shoulder.  Can still get his hand above shoulder level with some pain No loss of ER relative to the other shoulder.  Good endpoint with ER No TTP over the [facility] joint or bicipital groove 5/5 motor strength of the subscapularis and infraspinatus muscles.  Weakness of the supraspinatus compared with the contralateral side 5/5 grip strength, forearm pronation/supination, and bicep strength  Specialty Comments:  No specialty comments available.  Imaging: No results found.   PMFS History: Patient Active Problem List   Diagnosis Date Noted  . ED (erectile dysfunction) 08/09/2012  . Hypercholesterolemia   . Chronic  rhinitis   . Hyperlipidemia 02/29/2012   Past Medical History:  Diagnosis Date  . Anxiety   . Basal cell carcinoma   . Chronic rhinitis   . Clotting disorder (HCC)    right leg   . Depression   . Diverticulosis   . Elevated LFTs   . History of hypertension   . Hypercholesterolemia   . Sleep apnea     Family History  Problem Relation Age of Onset  . Diabetes Father   . Colon polyps Neg Hx   . Colon cancer Neg Hx   . Esophageal cancer Neg Hx   . Rectal cancer Neg Hx   .  Prostate cancer Neg Hx     Past Surgical History:  Procedure Laterality Date  . COLONOSCOPY    . WRIST SURGERY  01/24/2003   Right- ORIF: Dr. Amedeo Plenty   Social History   Occupational History  . Occupation: Architectural technologist: [employer]  Tobacco Use  . Smoking status: Never Smoker  . Smokeless tobacco: Never Used  Substance and Sexual Activity  . Alcohol use: No  . Drug use: No  . Sexual activity: Yes

## 2020-02-26 ENCOUNTER — Other Ambulatory Visit: Payer: Self-pay

## 2020-02-26 DIAGNOSIS — E782 Mixed hyperlipidemia: Secondary | ICD-10-CM

## 2020-02-26 MED ORDER — PRAVASTATIN SODIUM 40 MG PO TABS: 40.0000 mg | ORAL_TABLET | Freq: Every day | ORAL | 0 refills | Status: DC

## 2020-03-24 ENCOUNTER — Encounter: Payer: Self-pay | Admitting: Orthopedic Surgery

## 2020-03-24 ENCOUNTER — Other Ambulatory Visit: Payer: Self-pay | Admitting: Surgical

## 2020-03-24 DIAGNOSIS — M75121 Complete rotator cuff tear or rupture of right shoulder, not specified as traumatic: Secondary | ICD-10-CM | POA: Diagnosis not present

## 2020-03-24 DIAGNOSIS — M19011 Primary osteoarthritis, right shoulder: Secondary | ICD-10-CM | POA: Diagnosis not present

## 2020-03-24 DIAGNOSIS — S46011A Strain of muscle(s) and tendon(s) of the rotator cuff of right shoulder, initial encounter: Secondary | ICD-10-CM | POA: Diagnosis not present

## 2020-03-24 DIAGNOSIS — M7521 Bicipital tendinitis, right shoulder: Secondary | ICD-10-CM | POA: Diagnosis not present

## 2020-03-24 DIAGNOSIS — G8918 Other acute postprocedural pain: Secondary | ICD-10-CM | POA: Diagnosis not present

## 2020-03-24 DIAGNOSIS — M659 Synovitis and tenosynovitis, unspecified: Secondary | ICD-10-CM | POA: Diagnosis not present

## 2020-03-24 MED ORDER — ASPIRIN 81 MG PO TBEC: 81.0000 mg | DELAYED_RELEASE_TABLET | Freq: Every day | ORAL | 0 refills | Status: AC

## 2020-03-24 MED ORDER — OXYCODONE HCL 5 MG PO TABS: 5.0000 mg | ORAL_TABLET | ORAL | 0 refills | Status: AC | PRN

## 2020-03-24 MED ORDER — METHOCARBAMOL 500 MG PO TABS: 500.0000 mg | ORAL_TABLET | Freq: Three times a day (TID) | ORAL | 0 refills | Status: AC | PRN

## 2020-03-31 ENCOUNTER — Other Ambulatory Visit: Payer: Self-pay

## 2020-03-31 ENCOUNTER — Ambulatory Visit (INDEPENDENT_AMBULATORY_CARE_PROVIDER_SITE_OTHER): Payer: Medicare PPO | Admitting: Orthopedic Surgery

## 2020-03-31 DIAGNOSIS — M75101 Unspecified rotator cuff tear or rupture of right shoulder, not specified as traumatic: Secondary | ICD-10-CM

## 2020-04-02 ENCOUNTER — Ambulatory Visit: Payer: Medicare PPO | Admitting: Physical Therapy

## 2020-04-04 ENCOUNTER — Encounter: Payer: Self-pay | Admitting: Orthopedic Surgery

## 2020-04-04 NOTE — Progress Notes (Signed)
   Post-Op Visit Note   Patient: Jonathan Bridges           Date of Birth: 01-08-1946           MRN: [id number] Visit Date: 03/31/2020 PCP: Forrest Moron, MD   Assessment & Plan:  Chief Complaint:  Chief Complaint  Patient presents with  . Right Shoulder - Routine Post Op   Visit Diagnoses:  1. Tear of right supraspinatus tendon     Plan: Patient is a 74 year old male presents s/p right shoulder rotator cuff repair.  He is doing well overall.  Sutures are intact.  Is up to about 80 degrees on CPM machine.  He has been using the sling.  On exam he has good external rotation with 90 degrees of abduction and 100 degrees of forward flexion.  Incisions are healing well.  Sutures were removed and covered with Steri-Strips.  He is not taking any oxycodone and just taking Robaxin and Tylenol as needed.  He does have some difficulty sleeping on that side.  Plan for patient to discontinue sling in about a week.  We will start physical therapy at the end of next week to work on passive range of motion and active assisted range of motion.  Follow-up in 3 weeks for clinical recheck.  Follow-Up Instructions: No follow-ups on file.   Orders:  Orders Placed This Encounter  Procedures  . Ambulatory referral to Physical Therapy   No orders of the defined types were placed in this encounter.   Imaging: No results found.  PMFS History: Patient Active Problem List   Diagnosis Date Noted  . ED (erectile dysfunction) 08/09/2012  . Hypercholesterolemia   . Chronic rhinitis   . Hyperlipidemia 02/29/2012   Past Medical History:  Diagnosis Date  . Anxiety   . Basal cell carcinoma   . Chronic rhinitis   . Clotting disorder (HCC)    right leg   . Depression   . Diverticulosis   . Elevated LFTs   . History of hypertension   . Hypercholesterolemia   . Sleep apnea     Family History  Problem Relation Age of Onset  . Diabetes Father   . Colon polyps Neg Hx   . Colon cancer Neg Hx   .  Esophageal cancer Neg Hx   . Rectal cancer Neg Hx   . Prostate cancer Neg Hx     Past Surgical History:  Procedure Laterality Date  . COLONOSCOPY    . WRIST SURGERY  01/24/2003   Right- ORIF: Dr. Amedeo Plenty   Social History   Occupational History  . Occupation: Architectural technologist: [employer]  Tobacco Use  . Smoking status: Never Smoker  . Smokeless tobacco: Never Used  Substance and Sexual Activity  . Alcohol use: No  . Drug use: No  . Sexual activity: Yes

## 2020-04-05 ENCOUNTER — Encounter: Payer: Self-pay | Admitting: Orthopedic Surgery

## 2020-04-10 ENCOUNTER — Encounter: Payer: Self-pay | Admitting: Rehabilitative and Restorative Service Providers"

## 2020-04-10 ENCOUNTER — Other Ambulatory Visit: Payer: Self-pay

## 2020-04-10 ENCOUNTER — Encounter: Payer: Self-pay | Admitting: Orthopedic Surgery

## 2020-04-10 ENCOUNTER — Ambulatory Visit: Payer: Medicare PPO | Admitting: Rehabilitative and Restorative Service Providers"

## 2020-04-10 DIAGNOSIS — M25511 Pain in right shoulder: Secondary | ICD-10-CM

## 2020-04-10 DIAGNOSIS — M25611 Stiffness of right shoulder, not elsewhere classified: Secondary | ICD-10-CM

## 2020-04-10 DIAGNOSIS — M6281 Muscle weakness (generalized): Secondary | ICD-10-CM

## 2020-04-10 NOTE — Telephone Encounter (Signed)
Ok to [employer] chair thx

## 2020-04-10 NOTE — Patient Instructions (Signed)
Access Code: [email or web address] URL: [email or web address] Date: 04/10/2020 Prepared by: Scot Jun  Exercises Supine Shoulder Flexion AAROM with Hands Clasped - 2 x daily - 7 x weekly - 10 reps - 3 sets - 5 hold Seated Scapular Retraction - 2 x daily - 7 x weekly - 10 reps - 2 sets - 5 hold Seated Shoulder Flexion Towel Slide at Table Top - 2 x daily - 7 x weekly - 10 reps - 2-3 sets - 5 hold Seated Shoulder Scaption Slide at Table Top with Forearm in Neutral - 2 x daily - 7 x weekly - 2 sets - 10 reps - 5 hold

## 2020-04-10 NOTE — Therapy (Signed)
[place], [place], [address] Phone: [phone]   Fax:  [phone]  Physical Therapy Evaluation  Patient Details  Name: Jonathan Bridges MRN: [id number] Date of Birth: 1946-11-12 Referring Provider (PT): Dr. Marlou Sa   Encounter Date: 04/10/2020  PT End of Session - 04/10/20 0844    Visit Number  1    Number of Visits  16    Date for PT Re-Evaluation  05/08/20    Authorization Type  [employer]    Authorization Time Period  [employer]/ Medicare cert date [id number] - 07/03/2020    Authorization - Visit Number  1    Authorization - Number of Visits  12    Progress Note Due on Visit  10    PT Start Time  0845    PT Stop Time  0925    PT Time Calculation (min)  40 min    Activity Tolerance  Patient tolerated treatment well    Behavior During Therapy  [facility] for tasks assessed/performed       Past Medical History:  Diagnosis Date  . Anxiety   . Basal cell carcinoma   . Chronic rhinitis   . Clotting disorder (HCC)    right leg   . Depression   . Diverticulosis   . Elevated LFTs   . History of hypertension   . Hypercholesterolemia   . Sleep apnea     Past Surgical History:  Procedure Laterality Date  . COLONOSCOPY    . WRIST SURGERY  01/24/2003   Right- ORIF: Dr. Amedeo Plenty    There were no vitals filed for this visit.   Subjective Assessment - 04/10/20 0858    Subjective  Pt. indicated having trip about 1.5 years ago in which he tried to lift bag and felt pop in shoulder.  Pt. stated no immediate care for symptoms with very mild improvements and eventually saw Dr. Marlou Sa.  MRI was performed showing tear.  Pt. indicated surgery about 3 weeks ago.  Pt. stated wearing sling for a few weeks but now out of sling.  Pt. stated hard to sleep on Rt side and having sharp pains at times.   Has CPM machine for home use using 3x day.    Limitations  Lifting;House hold activities    Currently in Pain?  Yes    Pain Score  5    at worst  7 /10    Pain Location  Shoulder    Pain Orientation  Right    Pain Descriptors / Indicators  Aching;Sharp    Pain Type  Surgical pain;Chronic pain    Pain Onset  More than a month ago    Pain Frequency  Intermittent    Aggravating Factors   sleeping on Rt arm, end range movements.    Pain Relieving Factors  resting on pillow at night    Effect of Pain on Daily Activities  Overhead reaching, lifting.         [facility] PT Assessment - 04/10/20 0001      Assessment   Medical Diagnosis  Rt shoulder pain s/p RTC repair    Referring Provider (PT)  Dr. Marlou Sa    Onset Date/Surgical Date  03/10/20    Hand Dominance  Right      Precautions   Precaution Comments  PROM, AAROM      Balance Screen   Has the patient fallen in the past 6 months  No    Is the patient reluctant to leave their  home because of a fear of falling?   No      Home Environment   Living Environment  Private residence    Type of [place] Layout  Two level    Alternate Level Stairs-Number of Steps  14    Alternate Level Stairs-Rails  Left      Prior Function   Level of Independence  Independent    Vocation  Retired    Leisure  Walking for exercise, garden work/yard work       Cognition   Overall Cognitive Status  Within Functional Limits for tasks assessed      Posture/Postural Control   Posture/Postural Control  Postural limitations    Postural Limitations  Rounded Shoulders;Increased thoracic kyphosis    Posture Comments  Rt shoulder resting inferior compared to Lt.       ROM / Strength   AROM / PROM / Strength  Strength;PROM;AROM      AROM   AROM Assessment Site  Shoulder    Right/Left Shoulder  Left;Right      PROM   Overall PROM Comments  End range pain in er/ir mobility Rt shoulder PROm    PROM Assessment Site  Shoulder    Right/Left Shoulder  Right;Left    Right Shoulder Flexion  140 Degrees    Right Shoulder ABduction  125 Degrees    Right Shoulder Internal Rotation  40 Degrees    Right  Shoulder External Rotation  25 Degrees      Strength   Overall Strength Comments  Rt shoulder MMT not tested due to surgical protocol    Strength Assessment Site  Shoulder;Elbow;Forearm    Right/Left Shoulder  Left;Right    Left Shoulder Flexion  5/5    Left Shoulder ABduction  5/5    Left Shoulder Internal Rotation  5/5    Left Shoulder External Rotation  5/5    Right/Left Elbow  Left;Right    Right Elbow Flexion  5/5    Right Elbow Extension  5/5    Left Elbow Flexion  5/5    Left Elbow Extension  5/5    Right/Left Forearm  Left;Right      Special Tests   Other special tests  Lt HBB T9, HBH T5 (not tested on Rt UE)                Objective measurements completed on examination: See above findings.      [facility] Adult PT Treatment/Exercise - 04/10/20 0001      Exercises   Exercises  Shoulder      Shoulder Exercises: Supine   Other Supine Exercises  supine PROM c LT UE assist x 10      Shoulder Exercises: Seated   Other Seated Exercises  table slides flexion, scaption x 15 each Rt UE    Other Seated Exercises  scapular retraction 3 second hold x 15      Manual Therapy   Manual therapy comments  PROM, g2-g3 inferior joint mobs, AP mobs Rt GH jt             PT Education - 04/10/20 0925    Education Details  HEP, POC    Person(s) Educated  Patient    Methods  Explanation;Demonstration;Verbal cues;Handout    Comprehension  Verbalized understanding;Returned demonstration          PT Long Term Goals - 04/10/20 0846      PT LONG TERM GOAL #1  Title  Patient will demonstrate/report pain at worst less than or equal to 2/10 to facilitate minimal limitation in daily activity secondary to pain symptoms.    Baseline  --    Time  8    Period  Weeks    Status  New    Target Date  06/05/20      PT LONG TERM GOAL #2   Title  Patient will demonstrate independent use of home exercise program to facilitate ability to maintain/progress functional gains from  skilled physical therapy services.    Time  8    Period  Weeks    Status  New    Target Date  06/05/20      PT LONG TERM GOAL #3   Title  Pt. will demonstrate posture at PLOF s deviation/cues to improve scapulothoracic mobility for elevation attempts.    Time  8    Period  Weeks    Status  New    Target Date  06/05/20      PT LONG TERM GOAL #4   Title  Pt. will demonstrate Rt GH AROM WFL s symptoms to facilitate ability to perform daily and recreational overhead reaching, dressing at PLOF.    Time  8    Period  Weeks    Status  New    Target Date  06/05/20      PT LONG TERM GOAL #5   Title  Patient will demonstrate Rt UE MMT 5/5 throughout to facilitate usual lifting, carrying in functional activity to PLOF s limitation.    Time  8    Period  Weeks    Status  New    Target Date  06/05/20             Plan - 04/10/20 0844    Clinical Impression Statement  Patient is a 74 y.o. male who comes to clinic with complaints of Rt shoulder pain s/p recent RTC repair surgery with mobility, strength  deficits that impair their ability to perform usual daily and recreational functional activities without increase difficulty/symptoms at this time.  Patient to benefit from skilled PT services to address impairments and limitations to improve to previous level of function without restriction secondary to condition.    Personal Factors and Comorbidities  Comorbidity 3+    Comorbidities  anxiety, history of clotting disorder Rt leg, HTN    Examination-Activity Limitations  Reach Overhead;Carry;Lift;Dressing    Examination-Participation Restrictions  Community Activity;Yard Work;Driving    Stability/Clinical Decision Making  Stable/Uncomplicated    Clinical Decision Making  Low    Rehab Potential  Good    PT Frequency  2x / week    PT Duration  8 weeks    PT Treatment/Interventions  ADLs/Self Care Home Management;Electrical Stimulation;Iontophoresis 4mg /ml Dexamethasone;Moist  Heat;Ultrasound;Neuromuscular re-education;Balance training;Therapeutic exercise;Therapeutic activities;Functional mobility training;Patient/family education;Manual techniques;Dry needling;Passive range of motion;Taping;Spinal Manipulations;Joint Manipulations    PT Next Visit Plan  PROM, AAROM progression    PT Home Exercise Plan  [id number]    Consulted and Agree with Plan of Care  Patient       Patient will benefit from skilled therapeutic intervention in order to improve the following deficits and impairments:  Decreased range of motion, Pain, Impaired UE functional use, Decreased activity tolerance, Hypomobility, Impaired flexibility, Decreased mobility, Decreased strength  Visit Diagnosis: Right shoulder pain, unspecified chronicity - Plan: PT plan of care cert/re-cert  Muscle weakness (generalized) - Plan: PT plan of care cert/re-cert  Stiffness of right shoulder, not elsewhere classified -  Plan: PT plan of care cert/re-cert     Problem List Patient Active Problem List   Diagnosis Date Noted  . ED (erectile dysfunction) 08/09/2012  . Hypercholesterolemia   . Chronic rhinitis   . Hyperlipidemia 02/29/2012   Scot Jun, PT, DPT, OCS, ATC 04/10/20  9:44 AM    [facility] Physical Therapy [address] [place], [place], [address] Phone: [phone]   Fax:  [phone]  Name: Jonathan Bridges MRN: [id number] Date of Birth: [birth year]

## 2020-04-14 ENCOUNTER — Other Ambulatory Visit: Payer: Self-pay

## 2020-04-14 ENCOUNTER — Ambulatory Visit: Payer: Medicare PPO | Admitting: Physical Therapy

## 2020-04-14 DIAGNOSIS — M6281 Muscle weakness (generalized): Secondary | ICD-10-CM | POA: Diagnosis not present

## 2020-04-14 DIAGNOSIS — M25611 Stiffness of right shoulder, not elsewhere classified: Secondary | ICD-10-CM

## 2020-04-14 DIAGNOSIS — M25511 Pain in right shoulder: Secondary | ICD-10-CM | POA: Diagnosis not present

## 2020-04-14 NOTE — Therapy (Signed)
[place], [place], [address] Phone: [phone]   Fax:  [phone]  Physical Therapy Treatment  Patient Details  Name: Jonathan Bridges MRN: [id number] Date of Birth: 10-Nov-1946 Referring Provider (PT): Dr. Marlou Sa   Encounter Date: 04/14/2020  PT End of Session - 04/14/20 1015    Visit Number  2    Number of Visits  16    Date for PT Re-Evaluation  05/08/20    Authorization Type  [employer]    Authorization Time Period  [employer]/ Medicare cert date [id number] - 07/03/2020    Authorization - Visit Number  2    Authorization - Number of Visits  6    Progress Note Due on Visit  10    PT Start Time  0930    PT Stop Time  1015    PT Time Calculation (min)  45 min    Activity Tolerance  Patient tolerated treatment well    Behavior During Therapy  [facility] for tasks assessed/performed       Past Medical History:  Diagnosis Date  . Anxiety   . Basal cell carcinoma   . Chronic rhinitis   . Clotting disorder (HCC)    right leg   . Depression   . Diverticulosis   . Elevated LFTs   . History of hypertension   . Hypercholesterolemia   . Sleep apnea     Past Surgical History:  Procedure Laterality Date  . COLONOSCOPY    . WRIST SURGERY  01/24/2003   Right- ORIF: Dr. Amedeo Plenty    There were no vitals filed for this visit.  Subjective Assessment - 04/14/20 0958    Subjective  relays he still uses the CPM machine 3 times a day as they have not yet come to get it. he relays some soreness today but pain overall less than 3/10.    Limitations  Lifting;House hold activities    Pain Onset  More than a month ago        [facility] Adult PT Treatment/Exercise - 04/14/20 0001      Shoulder Exercises: Supine   Other Supine Exercises  supine PROM c LT UE assist x 15 reps for flexion (hands clasped) and ER (with ranger)    Other Supine Exercises  supine elbow AROM X 20 reps      Shoulder Exercises: Seated   Other Seated Exercises  table slides  PROM flexion, scaption, ER x 15 each Rt UE    Other Seated Exercises  scapular retraction 3 second hold x 15, gripping towel X20      Shoulder Exercises: Pulleys   Flexion  2 minutes    Flexion Limitations  PROM    Scaption  2 minutes    Scaption Limitations  PROM      Manual Therapy   Manual therapy comments  Rt shoulder PROM to tolerance all planes, gentle distraciton mobs for pain reduction        PT Long Term Goals - 04/10/20 0846      PT LONG TERM GOAL #1   Title  Patient will demonstrate/report pain at worst less than or equal to 2/10 to facilitate minimal limitation in daily activity secondary to pain symptoms.    Baseline  --    Time  8    Period  Weeks    Status  New    Target Date  06/05/20      PT LONG TERM GOAL #2   Title  Patient will  demonstrate independent use of home exercise program to facilitate ability to maintain/progress functional gains from skilled physical therapy services.    Time  8    Period  Weeks    Status  New    Target Date  06/05/20      PT LONG TERM GOAL #3   Title  Pt. will demonstrate posture at PLOF s deviation/cues to improve scapulothoracic mobility for elevation attempts.    Time  8    Period  Weeks    Status  New    Target Date  06/05/20      PT LONG TERM GOAL #4   Title  Pt. will demonstrate Rt GH AROM WFL s symptoms to facilitate ability to perform daily and recreational overhead reaching, dressing at PLOF.    Time  8    Period  Weeks    Status  New    Target Date  06/05/20      PT LONG TERM GOAL #5   Title  Patient will demonstrate Rt UE MMT 5/5 throughout to facilitate usual lifting, carrying in functional activity to PLOF s limitation.    Time  8    Period  Weeks    Status  New    Target Date  06/05/20            Plan - 04/14/20 1016    Clinical Impression Statement  He is doing well with PROM and had good tolerance to session. Recommended he get shoulder pulleys for home for ROM. He is 5 weeks post op and will  plan for AAROM/AROM next week if he is able to tolerate.    Personal Factors and Comorbidities  Comorbidity 3+    Comorbidities  anxiety, history of clotting disorder Rt leg, HTN    Examination-Activity Limitations  Reach Overhead;Carry;Lift;Dressing    Examination-Participation Restrictions  Community Activity;Yard Work;Driving    Stability/Clinical Decision Making  Stable/Uncomplicated    Rehab Potential  Good    PT Frequency  2x / week    PT Duration  8 weeks    PT Treatment/Interventions  ADLs/Self Care Home Management;Electrical Stimulation;Iontophoresis 4mg /ml Dexamethasone;Moist Heat;Ultrasound;Neuromuscular re-education;Balance training;Therapeutic exercise;Therapeutic activities;Functional mobility training;Patient/family education;Manual techniques;Dry needling;Passive range of motion;Taping;Spinal Manipulations;Joint Manipulations    PT Next Visit Plan  PROM, AAROM progression, may be able to begin AROM next week    PT Home Exercise Plan  [id number]    Consulted and Agree with Plan of Care  Patient       Patient will benefit from skilled therapeutic intervention in order to improve the following deficits and impairments:  Decreased range of motion, Pain, Impaired UE functional use, Decreased activity tolerance, Hypomobility, Impaired flexibility, Decreased mobility, Decreased strength  Visit Diagnosis: Right shoulder pain, unspecified chronicity  Muscle weakness (generalized)  Stiffness of right shoulder, not elsewhere classified     Problem List Patient Active Problem List   Diagnosis Date Noted  . ED (erectile dysfunction) 08/09/2012  . Hypercholesterolemia   . Chronic rhinitis   . Hyperlipidemia 02/29/2012    [name] 04/14/2020, 10:25 AM  [facility] Physical Therapy [address] [place], [place], [address] Phone: [phone]   Fax:  [phone]  Name: TITO PARKHOUSE MRN: [id number] Date of Birth: 08/05/1946

## 2020-04-21 ENCOUNTER — Other Ambulatory Visit: Payer: Self-pay

## 2020-04-21 ENCOUNTER — Ambulatory Visit (INDEPENDENT_AMBULATORY_CARE_PROVIDER_SITE_OTHER): Payer: Medicare PPO | Admitting: Surgical

## 2020-04-21 ENCOUNTER — Encounter: Payer: Self-pay | Admitting: Surgical

## 2020-04-21 DIAGNOSIS — M75101 Unspecified rotator cuff tear or rupture of right shoulder, not specified as traumatic: Secondary | ICD-10-CM

## 2020-04-21 NOTE — Progress Notes (Signed)
   Post-Op Visit Note   Patient: Jonathan Bridges           Date of Birth: Jan 30, 1946           MRN: [id number] Visit Date: 04/21/2020 PCP: Forrest Moron, MD   Assessment & Plan:  Chief Complaint:  Chief Complaint  Patient presents with  . Right Shoulder - Follow-up   Visit Diagnoses: No diagnosis found.  Plan: Patient is a 74 year old male presents s/p right shoulder rotator cuff repair.  Patient notes that he still feels some soreness but overall notes steady improvement.  He is going to physical therapy where they are working on passive and active range of motion.  He is also doing a home exercise program.  He is taking Tylenol 3 times a day without use of narcotic medication or muscle relaxer.  He does have some difficulty sleeping and is not able to sleep on the right side just yet.  He is sleeping in a bed.  On exam incision is healing well and he has excellent range of motion.  External rotation to 35 degrees, abduction to 95 degrees, forward flexion to 145 degrees.  Axillary nerve is functioning well.  Plan for patient to continue physical therapy.  He is about 4 weeks out so he can start strengthening in 2 weeks when he is 6 weeks out from surgery.  The torn tendon was not able to be fully restored to its insertion site but for scalpel should still be appropriate.  Plan for patient to return to the office in 5 weeks for clinical recheck.  Follow-Up Instructions: No follow-ups on file.   Orders:  No orders of the defined types were placed in this encounter.  No orders of the defined types were placed in this encounter.   Imaging: No results found.  PMFS History: Patient Active Problem List   Diagnosis Date Noted  . ED (erectile dysfunction) 08/09/2012  . Hypercholesterolemia   . Chronic rhinitis   . Hyperlipidemia 02/29/2012   Past Medical History:  Diagnosis Date  . Anxiety   . Basal cell carcinoma   . Chronic rhinitis   . Clotting disorder (HCC)    right leg   .  Depression   . Diverticulosis   . Elevated LFTs   . History of hypertension   . Hypercholesterolemia   . Sleep apnea     Family History  Problem Relation Age of Onset  . Diabetes Father   . Colon polyps Neg Hx   . Colon cancer Neg Hx   . Esophageal cancer Neg Hx   . Rectal cancer Neg Hx   . Prostate cancer Neg Hx     Past Surgical History:  Procedure Laterality Date  . COLONOSCOPY    . WRIST SURGERY  01/24/2003   Right- ORIF: Dr. Amedeo Plenty   Social History   Occupational History  . Occupation: Architectural technologist: [employer]  Tobacco Use  . Smoking status: Never Smoker  . Smokeless tobacco: Never Used  Substance and Sexual Activity  . Alcohol use: No  . Drug use: No  . Sexual activity: Yes

## 2020-04-23 ENCOUNTER — Encounter: Payer: Self-pay | Admitting: Physical Therapy

## 2020-04-23 ENCOUNTER — Ambulatory Visit: Payer: Medicare PPO | Admitting: Physical Therapy

## 2020-04-23 ENCOUNTER — Other Ambulatory Visit: Payer: Self-pay

## 2020-04-23 DIAGNOSIS — M6281 Muscle weakness (generalized): Secondary | ICD-10-CM | POA: Diagnosis not present

## 2020-04-23 DIAGNOSIS — M25511 Pain in right shoulder: Secondary | ICD-10-CM

## 2020-04-23 DIAGNOSIS — M25611 Stiffness of right shoulder, not elsewhere classified: Secondary | ICD-10-CM | POA: Diagnosis not present

## 2020-04-23 NOTE — Patient Instructions (Signed)
Access Code: [email or web address] URL: [email or web address] Date: 04/23/2020 Prepared by: Elsie Ra  Exercises .Seated Shoulder Flexion AAROM with Pulley Behind - 1 x daily - 6 x weekly - 1 sets - 1 reps - 2 min total 5 sec hold at top hold .Seated Shoulder Abduction AAROM with Pulley Behind - 1 x daily - 6 x weekly - 2 min total 5 sec hld at top hold .Supine Shoulder Flexion Extension AAROM with Dowel - 2 x daily - 6 x weekly - 2 sets - 10 reps .Supine Shoulder External Rotation in 45 Degrees Abduction AAROM with Dowel - 2 x daily - 6 x weekly - 2 sets - 10 reps - 5 hold .Supine Shoulder Abduction AAROM with Dowel - 2 x daily - 6 x weekly - 2 sets - 10 reps - 5 hold .Standing Shoulder Flexion Wall Walk - 2 x daily - 6 x weekly - 10 reps - 1 sets - 5 sec hold .Standing Shoulder Abduction Finger Walk at Wall - 2 x daily - 6 x weekly - 1 sets - 10 reps - 5 sec hold

## 2020-04-23 NOTE — Therapy (Signed)
Jonathan Bridges, [place], [address] Phone: [phone]   Fax:  [phone]  Physical Therapy Treatment  Patient Details  Name: Jonathan Bridges MRN: [id number] Date of Birth: Sep 02, 1946 Referring Provider (PT): Dr. Marlou Sa   Encounter Date: 04/23/2020  PT End of Session - 04/23/20 1020    Visit Number  3    Number of Visits  16    Date for PT Re-Evaluation  05/08/20    Authorization Type  [employer]    Authorization Time Period  [employer]/ Medicare cert date [id number] - 07/03/2020    Authorization - Visit Number  3    Authorization - Number of Visits  6    Progress Note Due on Visit  10    PT Start Time  0845    PT Stop Time  0930    PT Time Calculation (min)  45 min    Activity Tolerance  Patient tolerated treatment well    Behavior During Therapy  [facility] for tasks assessed/performed       Past Medical History:  Diagnosis Date  . Anxiety   . Basal cell carcinoma   . Chronic rhinitis   . Clotting disorder (HCC)    right leg   . Depression   . Diverticulosis   . Elevated LFTs   . History of hypertension   . Hypercholesterolemia   . Sleep apnea     Past Surgical History:  Procedure Laterality Date  . COLONOSCOPY    . WRIST SURGERY  01/24/2003   Right- ORIF: Dr. Amedeo Plenty    There were no vitals filed for this visit.  Subjective Assessment - 04/23/20 0908    Subjective  relays overall doing well, Rt shoulder having some mild pain and soreness but not a lot of pain    Limitations  Lifting;House hold activities    Pain Onset  More than a month ago         [facility] PT Assessment - 04/23/20 0001      Assessment   Medical Diagnosis  Rt shoulder pain s/p RTC repair    Referring Provider (PT)  Dr. Marlou Sa    Onset Date/Surgical Date  03/10/20    Hand Dominance  Right      PROM   Overall PROM Comments  Rt shoulder PROM WNL                   [facility] Adult PT Treatment/Exercise - 04/23/20 0001      Shoulder Exercises:  Supine   Other Supine Exercises  supine AAROM flexion, abduction, ER 15 reps ea    Other Supine Exercises  supine chest press AAROM X 20       Shoulder Exercises: Standing   Other Standing Exercises  shoulder IR and extension AAROM X 15 ea      Shoulder Exercises: Pulleys   Flexion  2 minutes    ABduction  2 minutes      Shoulder Exercises: ROM/Strengthening   UBE (Upper Arm Bike)  2 min fwd, 2 min back L2    Wall Wash  wall ladder X 10 flexion, X 5 abduction      Manual Therapy   Manual therapy comments  Rt shoulder PROM all planes             PT Education - 04/23/20 1019    Education Details  HEP revision    Person(s) Educated  Patient    Methods  Explanation;Demonstration;Verbal cues;Handout    Comprehension  Verbalized understanding;Returned demonstration          PT Long Term Goals - 04/10/20 0846      PT LONG TERM GOAL #1   Title  Patient will demonstrate/report pain at worst less than or equal to 2/10 to facilitate minimal limitation in daily activity secondary to pain symptoms.    Baseline  --    Time  8    Period  Weeks    Status  New    Target Date  06/05/20      PT LONG TERM GOAL #2   Title  Patient will demonstrate independent use of home exercise program to facilitate ability to maintain/progress functional gains from skilled physical therapy services.    Time  8    Period  Weeks    Status  New    Target Date  06/05/20      PT LONG TERM GOAL #3   Title  Pt. will demonstrate posture at PLOF s deviation/cues to improve scapulothoracic mobility for elevation attempts.    Time  8    Period  Weeks    Status  New    Target Date  06/05/20      PT LONG TERM GOAL #4   Title  Pt. will demonstrate Rt GH AROM WFL s symptoms to facilitate ability to perform daily and recreational overhead reaching, dressing at PLOF.    Time  8    Period  Weeks    Status  New    Target Date  06/05/20      PT LONG TERM GOAL #5   Title  Patient will demonstrate Rt UE  MMT 5/5 throughout to facilitate usual lifting, carrying in functional activity to PLOF s limitation.    Time  8    Period  Weeks    Status  New    Target Date  06/05/20            Plan - 04/23/20 0910    Clinical Impression Statement  He is 4.5 weeks out of surgery S/P RTC repair, date of surgery listed on evaluation was listed wrong 03/10/20 but surgery was actually 03/24/20. He now has full  PROM. He saw PA this week who says AROM okay, can begin strength in 2 weeks when he is 6 weeks out. Session focused on Rt shoulder AROM/AAROM with great tolerance and his HEP was revised.    Personal Factors and Comorbidities  Comorbidity 3+    Comorbidities  anxiety, history of clotting disorder Rt leg, HTN    Examination-Activity Limitations  Reach Overhead;Carry;Lift;Dressing    Examination-Participation Restrictions  Community Activity;Yard Work;Driving    Stability/Clinical Decision Making  Stable/Uncomplicated    Rehab Potential  Good    PT Frequency  2x / week    PT Duration  8 weeks    PT Treatment/Interventions  ADLs/Self Care Home Management;Electrical Stimulation;Iontophoresis 4mg /ml Dexamethasone;Moist Heat;Ultrasound;Neuromuscular re-education;Balance training;Therapeutic exercise;Therapeutic activities;Functional mobility training;Patient/family education;Manual techniques;Dry needling;Passive range of motion;Taping;Spinal Manipulations;Joint Manipulations    PT Next Visit Plan  AROM/AAROM, can add strength on 5/24    PT Home Exercise Plan  [id number]    Consulted and Agree with Plan of Care  Patient       Patient will benefit from skilled therapeutic intervention in order to improve the following deficits and impairments:  Decreased range of motion, Pain, Impaired UE functional use, Decreased activity tolerance, Hypomobility, Impaired flexibility, Decreased mobility, Decreased strength  Visit Diagnosis: Right shoulder pain, unspecified chronicity  Muscle weakness  (generalized)  Stiffness of right shoulder, not elsewhere classified     Problem List Patient Active Problem List   Diagnosis Date Noted  . ED (erectile dysfunction) 08/09/2012  . Hypercholesterolemia   . Chronic rhinitis   . Hyperlipidemia 02/29/2012    [name] 04/23/2020, 10:22 AM  [facility] [address] [place], [place], [address] Phone: [phone]   Fax:  [phone]  Name: Jonathan Bridges MRN: [id number] Date of Birth: 1946-06-23

## 2020-04-25 ENCOUNTER — Encounter: Payer: Self-pay | Admitting: Rehabilitative and Restorative Service Providers"

## 2020-04-25 ENCOUNTER — Other Ambulatory Visit: Payer: Self-pay

## 2020-04-25 ENCOUNTER — Ambulatory Visit: Payer: Medicare PPO | Admitting: Rehabilitative and Restorative Service Providers"

## 2020-04-25 DIAGNOSIS — M25511 Pain in right shoulder: Secondary | ICD-10-CM

## 2020-04-25 DIAGNOSIS — M6281 Muscle weakness (generalized): Secondary | ICD-10-CM

## 2020-04-25 DIAGNOSIS — M25611 Stiffness of right shoulder, not elsewhere classified: Secondary | ICD-10-CM

## 2020-04-25 NOTE — Therapy (Signed)
[facility] [place], [place], [address] Phone: [phone]   Fax:  [phone]  Physical Therapy Treatment  Patient Details  Name: Jonathan Bridges MRN: [id number] Date of Birth: January 26, 1946 Referring Provider (PT): Dr. Marlou Sa   Encounter Date: 04/25/2020  PT End of Session - 04/25/20 0853    Visit Number  4    Number of Visits  16    Date for PT Re-Evaluation  05/08/20    Authorization Type  [employer]    Authorization Time Period  [employer]/ Medicare cert date [id number] - 07/03/2020    Authorization - Visit Number  4    Authorization - Number of Visits  6    Progress Note Due on Visit  10    PT Start Time  0846    PT Stop Time  0925    PT Time Calculation (min)  39 min    Activity Tolerance  Patient tolerated treatment well    Behavior During Therapy  [facility] for tasks assessed/performed       Past Medical History:  Diagnosis Date  . Anxiety   . Basal cell carcinoma   . Chronic rhinitis   . Clotting disorder (HCC)    right leg   . Depression   . Diverticulosis   . Elevated LFTs   . History of hypertension   . Hypercholesterolemia   . Sleep apnea     Past Surgical History:  Procedure Laterality Date  . COLONOSCOPY    . WRIST SURGERY  01/24/2003   Right- ORIF: Dr. Amedeo Plenty    There were no vitals filed for this visit.  Subjective Assessment - 04/25/20 0852    Subjective  Pt. indicated some soreness at times during the day.  Some increased after exercises at time.    Limitations  Lifting;House hold activities    Currently in Pain?  Yes    Pain Location  Shoulder    Pain Orientation  Right    Pain Descriptors / Indicators  Sore    Pain Type  Surgical pain;Chronic pain    Pain Onset  More than a month ago    Aggravating Factors   sometimes after exercises    Pain Relieving Factors  icing and rest         [facility] PT Assessment - 04/25/20 0001      Assessment   Medical Diagnosis  Rt shoulder pain s/p RTC repair     Referring Provider (PT)  Dr. Marlou Sa    Onset Date/Surgical Date  03/24/20   Corrected, Pt. provided wrong date on evaluation   Next MD Visit  05/23/2020                    [facility] Adult PT Treatment/Exercise - 04/25/20 0001      Shoulder Exercises: Supine   Other Supine Exercises  supine wand flexion 3 x 15 2 lb bar    Other Supine Exercises  supine serratus press up  5 sec hold x 20      Shoulder Exercises: Standing   Other Standing Exercises  standing rows, gh ext green 3 x 10 each      Shoulder Exercises: Pulleys   Flexion  2 minutes    ABduction  2 minutes      Shoulder Exercises: ROM/Strengthening   UBE (Upper Arm Bike)  Lvl 2 3 mins fwd/rev each                  PT Long  Term Goals - 04/25/20 0856      PT LONG TERM GOAL #1   Title  Patient will demonstrate/report pain at worst less than or equal to 2/10 to facilitate minimal limitation in daily activity secondary to pain symptoms.    Time  8    Period  Weeks    Status  On-going      PT LONG TERM GOAL #2   Title  Patient will demonstrate independent use of home exercise program to facilitate ability to maintain/progress functional gains from skilled physical therapy services.    Time  8    Period  Weeks    Status  On-going      PT LONG TERM GOAL #3   Title  Pt. will demonstrate posture at PLOF s deviation/cues to improve scapulothoracic mobility for elevation attempts.    Time  8    Period  Weeks    Status  On-going      PT LONG TERM GOAL #4   Title  Pt. will demonstrate Rt GH AROM WFL s symptoms to facilitate ability to perform daily and recreational overhead reaching, dressing at PLOF.    Time  8    Period  Weeks    Status  On-going      PT LONG TERM GOAL #5   Title  Patient will demonstrate Rt UE MMT 5/5 throughout to facilitate usual lifting, carrying in functional activity to PLOF s limitation.    Time  8    Period  Weeks    Status  On-going            Plan - 04/25/20 0900     Clinical Impression Statement  PROM making good progress at this time.  Corrected informationon surgery date (Pt. informed wrong date on evaluation) shows strengthening to progress on 5/24.  Due to progress at this time, reduced frequency next week and then return to 2x/week when strengthening can occur.    Personal Factors and Comorbidities  Comorbidity 3+    Comorbidities  anxiety, history of clotting disorder Rt leg, HTN    Examination-Activity Limitations  Reach Overhead;Carry;Lift;Dressing    Examination-Participation Restrictions  Community Activity;Yard Work;Driving    Stability/Clinical Decision Making  Stable/Uncomplicated    Rehab Potential  Good    PT Frequency  2x / week    PT Duration  8 weeks    PT Treatment/Interventions  ADLs/Self Care Home Management;Electrical Stimulation;Iontophoresis 4mg /ml Dexamethasone;Moist Heat;Ultrasound;Neuromuscular re-education;Balance training;Therapeutic exercise;Therapeutic activities;Functional mobility training;Patient/family education;Manual techniques;Dry needling;Passive range of motion;Taping;Spinal Manipulations;Joint Manipulations    PT Next Visit Plan  Strengthening on 5/24    PT Home Exercise Plan  [id number]    Consulted and Agree with Plan of Care  Patient       Patient will benefit from skilled therapeutic intervention in order to improve the following deficits and impairments:  Decreased range of motion, Pain, Impaired UE functional use, Decreased activity tolerance, Hypomobility, Impaired flexibility, Decreased mobility, Decreased strength  Visit Diagnosis: Right shoulder pain, unspecified chronicity  Muscle weakness (generalized)  Stiffness of right shoulder, not elsewhere classified     Problem List Patient Active Problem List   Diagnosis Date Noted  . ED (erectile dysfunction) 08/09/2012  . Hypercholesterolemia   . Chronic rhinitis   . Hyperlipidemia 02/29/2012    Scot Jun, PT, DPT, OCS, ATC 04/25/20  9:22  AM     [facility] [address] [place] [place], [place], [address] Phone: [phone]   Fax:  [phone]  Name: Jonathan  JAIME Bridges MRN: [id number] Date of Birth: 10/23/1946

## 2020-04-29 ENCOUNTER — Encounter: Payer: Medicare PPO | Admitting: Rehabilitative and Restorative Service Providers"

## 2020-05-01 ENCOUNTER — Other Ambulatory Visit: Payer: Self-pay

## 2020-05-01 ENCOUNTER — Encounter: Payer: Self-pay | Admitting: Rehabilitative and Restorative Service Providers"

## 2020-05-01 ENCOUNTER — Ambulatory Visit: Payer: Medicare PPO | Admitting: Rehabilitative and Restorative Service Providers"

## 2020-05-01 DIAGNOSIS — M6281 Muscle weakness (generalized): Secondary | ICD-10-CM | POA: Diagnosis not present

## 2020-05-01 DIAGNOSIS — M25511 Pain in right shoulder: Secondary | ICD-10-CM | POA: Diagnosis not present

## 2020-05-01 DIAGNOSIS — M25611 Stiffness of right shoulder, not elsewhere classified: Secondary | ICD-10-CM

## 2020-05-01 NOTE — Therapy (Signed)
Jonathan Bridges, [place], [address] Phone: [phone]   Fax:  [phone]  Physical Therapy Treatment  Patient Details  Name: Jonathan Bridges MRN: [id number] Date of Birth: 07/23/1946 Referring Provider (PT): Dr. Marlou Sa   Encounter Date: 05/01/2020  PT End of Session - 05/01/20 0842    Visit Number  5    Number of Visits  16    Date for PT Re-Evaluation  05/08/20    Authorization Type  [employer]    Authorization Time Period  [employer]/ Medicare cert date [id number] - 07/03/2020    Authorization - Visit Number  5    Authorization - Number of Visits  6    Progress Note Due on Visit  10    PT Start Time  [id number]    PT Stop Time  0923    PT Time Calculation (min)  39 min    Activity Tolerance  Patient tolerated treatment well    Behavior During Therapy  [facility] for tasks assessed/performed       Past Medical History:  Diagnosis Date  . Anxiety   . Basal cell carcinoma   . Chronic rhinitis   . Clotting disorder (HCC)    right leg   . Depression   . Diverticulosis   . Elevated LFTs   . History of hypertension   . Hypercholesterolemia   . Sleep apnea     Past Surgical History:  Procedure Laterality Date  . COLONOSCOPY    . WRIST SURGERY  01/24/2003   Right- ORIF: Dr. Amedeo Plenty    There were no vitals filed for this visit.  Subjective Assessment - 05/01/20 0852    Subjective  Pt. indicated soreness at times continued but feeling fairly well overall.    Limitations  Lifting;House hold activities    Currently in Pain?  Yes    Pain Score  2     Pain Orientation  Right    Pain Descriptors / Indicators  Sore    Pain Onset  More than a month ago    Pain Frequency  Intermittent    Aggravating Factors   insidious soreness                        [facility] Adult PT Treatment/Exercise - 05/01/20 0001      Neuro Re-ed    Neuro Re-ed Details   rhythmic stabilizations in supine 100 deg flexion c mild resistance      Shoulder Exercises: Supine   Other Supine Exercises  supine wand flexion 3 x 15 2 lb bar    Other Supine Exercises  supine serratus press up  5 sec hold x 20   1 lb     Shoulder Exercises: Standing   Extension  Strengthening;Other (comment)   3 x 10   Theraband Level (Shoulder Extension)  Level 3 (Green)    Row  Strengthening;Both;Other (comment)   3 x 10 green band   Theraband Level (Shoulder Row)  Level 3 (Green)    Other Standing Exercises  UE ranger flexion c ipsilateral stepping 3 x 10      Shoulder Exercises: Pulleys   Flexion  2 minutes    ABduction  2 minutes      Shoulder Exercises: ROM/Strengthening   UBE (Upper Arm Bike)  Lvl 2 4 mins each way                  PT Long Term Goals - 04/25/20  0856      PT LONG TERM GOAL #1   Title  Patient will demonstrate/report pain at worst less than or equal to 2/10 to facilitate minimal limitation in daily activity secondary to pain symptoms.    Time  8    Period  Weeks    Status  On-going      PT LONG TERM GOAL #2   Title  Patient will demonstrate independent use of home exercise program to facilitate ability to maintain/progress functional gains from skilled physical therapy services.    Time  8    Period  Weeks    Status  On-going      PT LONG TERM GOAL #3   Title  Pt. will demonstrate posture at PLOF s deviation/cues to improve scapulothoracic mobility for elevation attempts.    Time  8    Period  Weeks    Status  On-going      PT LONG TERM GOAL #4   Title  Pt. will demonstrate Rt GH AROM WFL s symptoms to facilitate ability to perform daily and recreational overhead reaching, dressing at PLOF.    Time  8    Period  Weeks    Status  On-going      PT LONG TERM GOAL #5   Title  Patient will demonstrate Rt UE MMT 5/5 throughout to facilitate usual lifting, carrying in functional activity to PLOF s limitation.    Time  8    Period  Weeks    Status  On-going            Plan - 05/01/20 [id number]     Clinical Impression Statement  Early isometric holds fair performance today but no pain symptoms indicated.  Pt. progressing well towards initiation of AROM/strengthening on next visit.    Personal Factors and Comorbidities  Comorbidity 3+    Comorbidities  anxiety, history of clotting disorder Rt leg, HTN    Examination-Activity Limitations  Reach Overhead;Carry;Lift;Dressing    Examination-Participation Restrictions  Community Activity;Yard Work;Driving    Stability/Clinical Decision Making  Stable/Uncomplicated    Rehab Potential  Good    PT Frequency  2x / week    PT Duration  8 weeks    PT Treatment/Interventions  ADLs/Self Care Home Management;Electrical Stimulation;Iontophoresis 4mg /ml Dexamethasone;Moist Heat;Ultrasound;Neuromuscular re-education;Balance training;Therapeutic exercise;Therapeutic activities;Functional mobility training;Patient/family education;Manual techniques;Dry needling;Passive range of motion;Taping;Spinal Manipulations;Joint Manipulations    PT Next Visit Plan  [employer] Re-cert due    PT Home Exercise Plan  [id number]    Consulted and Agree with Plan of Care  Patient       Patient will benefit from skilled therapeutic intervention in order to improve the following deficits and impairments:  Decreased range of motion, Pain, Impaired UE functional use, Decreased activity tolerance, Hypomobility, Impaired flexibility, Decreased mobility, Decreased strength  Visit Diagnosis: Right shoulder pain, unspecified chronicity  Muscle weakness (generalized)  Stiffness of right shoulder, not elsewhere classified     Problem List Patient Active Problem List   Diagnosis Date Noted  . ED (erectile dysfunction) 08/09/2012  . Hypercholesterolemia   . Chronic rhinitis   . Hyperlipidemia 02/29/2012   Jonathan Bridges, PT, DPT, OCS, ATC 05/01/20  9:25 AM    [facility] [address] [place], [place], [address] Phone: [phone]   Fax:   [phone]  Name: Jonathan Bridges MRN: [id number] Date of Birth: 09-Aug-1946

## 2020-05-06 ENCOUNTER — Other Ambulatory Visit: Payer: Self-pay

## 2020-05-06 ENCOUNTER — Ambulatory Visit: Payer: Medicare PPO | Admitting: Rehabilitative and Restorative Service Providers"

## 2020-05-06 ENCOUNTER — Encounter: Payer: Self-pay | Admitting: Rehabilitative and Restorative Service Providers"

## 2020-05-06 DIAGNOSIS — M25611 Stiffness of right shoulder, not elsewhere classified: Secondary | ICD-10-CM

## 2020-05-06 DIAGNOSIS — M25511 Pain in right shoulder: Secondary | ICD-10-CM

## 2020-05-06 DIAGNOSIS — M6281 Muscle weakness (generalized): Secondary | ICD-10-CM | POA: Diagnosis not present

## 2020-05-06 NOTE — Therapy (Signed)
[place], [place], [address] Phone: [phone]   Fax:  [phone]  Physical Therapy Treatment/Progress Note/Recertification  Patient Details  Name: Jonathan Bridges MRN: [id number] Date of Birth: 08-27-1946 Referring Provider (PT): Dr. Marlou Sa   Encounter Date: 05/06/2020   Progress Note Reporting Period 04/10/2020 to 05/06/2020  See note below for Objective Data and Assessment of Progress/Goals.       PT End of Session - 05/06/20 0846    Visit Number  6    Number of Visits  18    Date for PT Re-Evaluation  05/08/20    Authorization Type  [employer]    Authorization Time Period  [employer]/ Medicare cert date 3/81/8299 - 07/03/2020    Authorization - Visit Number  6    Authorization - Number of Visits  6    Progress Note Due on Visit  16    PT Start Time  0844    PT Stop Time  0924    PT Time Calculation (min)  40 min    Activity Tolerance  Patient tolerated treatment well    Behavior During Therapy  [facility] for tasks assessed/performed       Past Medical History:  Diagnosis Date  . Anxiety   . Basal cell carcinoma   . Chronic rhinitis   . Clotting disorder (HCC)    right leg   . Depression   . Diverticulosis   . Elevated LFTs   . History of hypertension   . Hypercholesterolemia   . Sleep apnea     Past Surgical History:  Procedure Laterality Date  . COLONOSCOPY    . WRIST SURGERY  01/24/2003   Right- ORIF: Dr. Amedeo Plenty    There were no vitals filed for this visit.  Subjective Assessment - 05/06/20 0848    Subjective  Pt. indicated pain at worst in last week around 3/10 or so.   Pt. indicated overall improvement at 80% to PLOF.    Limitations  Lifting;House hold activities    Pain Score  3     Pain Location  Shoulder    Pain Orientation  Right    Pain Descriptors / Indicators  Sore    Pain Onset  More than a month ago    Aggravating Factors   insidious    Effect of Pain on Daily Activities  Lifting,  overhead reaching, carrying         [facility] PT Assessment - 05/06/20 0001      Assessment   Medical Diagnosis  Rt shoulder pain s/p RTC repair    Referring Provider (PT)  Dr. Marlou Sa    Onset Date/Surgical Date  03/24/20    Hand Dominance  Right    Next MD Visit  05/23/2020      AROM   Right Shoulder Flexion  170 Degrees    Right Shoulder ABduction  140 Degrees   mild ERP   Right Shoulder Internal Rotation  70 Degrees   measured in 45 deg abd   Right Shoulder External Rotation  70 Degrees   measured in 45 deg abd     PROM   Right Shoulder Flexion  170 Degrees    Right Shoulder ABduction  145 Degrees    Right Shoulder Internal Rotation  70 Degrees    Right Shoulder External Rotation  70 Degrees      Strength   Right Shoulder Flexion  3+/5    Right Shoulder ABduction  3/5    Right  Shoulder Internal Rotation  5/5    Right Shoulder External Rotation  4-/5                    [facility] Adult PT Treatment/Exercise - 05/06/20 0001      Neuro Re-ed    Neuro Re-ed Details   rhythmic stabilizations in supine 100 deg flexion c mod resistance      Shoulder Exercises: Supine   Other Supine Exercises  supine AROM flexion 5 mins    Other Supine Exercises  supine serratus press up  5 sec hold x 15 2 lbs      Shoulder Exercises: Sidelying   External Rotation  AROM;Right   3 x 10   Flexion  AROM;Right   3 x 10   ABduction  AROM;Right   3 x 10     Shoulder Exercises: Standing   Other Standing Exercises  UE ranger flexion c ipsilateral stepping x20      Shoulder Exercises: Pulleys   Flexion  2 minutes    ABduction  2 minutes      Shoulder Exercises: ROM/Strengthening   UBE (Upper Arm Bike)  Lvl 2.5 3 mins fwd/rev each                  PT Long Term Goals - 05/06/20 0912      PT LONG TERM GOAL #1   Title  Patient will demonstrate/report pain at worst less than or equal to 2/10 to facilitate minimal limitation in daily activity secondary to pain symptoms.     Time  6    Period  Weeks    Status  On-going    Target Date  06/17/20      PT LONG TERM GOAL #2   Title  Patient will demonstrate independent use of home exercise program to facilitate ability to maintain/progress functional gains from skilled physical therapy services.    Time  6    Period  Weeks    Status  On-going    Target Date  06/17/20      PT LONG TERM GOAL #3   Title  Pt. will demonstrate posture at PLOF s deviation/cues to improve scapulothoracic mobility for elevation attempts.    Time  6    Period  Weeks    Status  On-going    Target Date  06/17/20      PT LONG TERM GOAL #4   Title  Pt. will demonstrate Rt GH AROM WFL s symptoms to facilitate ability to perform daily and recreational overhead reaching, dressing at PLOF.    Time  6    Period  Weeks    Status  Partially Met    Target Date  06/17/20      PT LONG TERM GOAL #5   Title  Patient will demonstrate Rt UE MMT 5/5 throughout to facilitate usual lifting, carrying in functional activity to PLOF s limitation.    Time  6    Period  Weeks    Status  On-going    Target Date  06/17/20            Plan - 05/06/20 0849    Clinical Impression Statement  Pt. has attended 6 visits overall during courese of treatment, rating 80% overall improvement to this point.  See objective data for updated information.  Progress noted in mobility at this time compared to evaluation.  Pt. has recently transitioned into strengthening/AROM training per surgical protocol, therefore limiting overall strength gains to  this point.  Pt. presentation requires continued skilled PT services as indicated at this time.  Additional [employer] authorization required at this time.    Personal Factors and Comorbidities  Comorbidity 3+    Comorbidities  anxiety, history of clotting disorder Rt leg, HTN    Examination-Activity Limitations  Reach Overhead;Carry;Lift;Dressing    Examination-Participation Restrictions  Community Activity;Yard Work;Driving     Stability/Clinical Decision Making  Stable/Uncomplicated    Rehab Potential  Good    PT Frequency  2x / week    PT Duration  6 weeks    PT Treatment/Interventions  ADLs/Self Care Home Management;Electrical Stimulation;Iontophoresis 21m/ml Dexamethasone;Moist Heat;Ultrasound;Neuromuscular re-education;Balance training;Therapeutic exercise;Therapeutic activities;Functional mobility training;Patient/family education;Manual techniques;Dry needling;Passive range of motion;Taping;Spinal Manipulations;Joint Manipulations    PT Next Visit Plan  Recommend 2x/week for 6 weeks with additional authorized visits required.  Focus on strength gains    PT Home Exercise Plan  [id number]    Consulted and Agree with Plan of Care  Patient       Patient will benefit from skilled therapeutic intervention in order to improve the following deficits and impairments:  Decreased range of motion, Pain, Impaired UE functional use, Decreased activity tolerance, Hypomobility, Impaired flexibility, Decreased mobility, Decreased strength  Visit Diagnosis: Right shoulder pain, unspecified chronicity  Muscle weakness (generalized)  Stiffness of right shoulder, not elsewhere classified     Problem List Patient Active Problem List   Diagnosis Date Noted  . ED (erectile dysfunction) 08/09/2012  . Hypercholesterolemia   . Chronic rhinitis   . Hyperlipidemia 02/29/2012   MScot Jun PT, DPT, OCS, ATC 05/06/20  9:23 AM    [facility] [address] [address]Phone: [phone]  Fax:  [phone] Name: Jonathan MALBURGMRN: [id number] of Birth: [birth year]

## 2020-05-08 ENCOUNTER — Telehealth: Payer: Self-pay | Admitting: Rehabilitative and Restorative Service Providers"

## 2020-05-08 ENCOUNTER — Encounter: Payer: Medicare PPO | Admitting: Rehabilitative and Restorative Service Providers"

## 2020-05-08 NOTE — Telephone Encounter (Signed)
Called and left message after no show for 15 mins.  Reminded about next visit scheduled.   Scot Jun, PT, DPT, OCS, ATC 05/08/20  9:06 AM

## 2020-05-10 ENCOUNTER — Ambulatory Visit (HOSPITAL_COMMUNITY)
Admission: EM | Admit: 2020-05-10 | Discharge: 2020-05-10 | Disposition: A | Discharge status: Home / Self Care | Payer: Medicare PPO | Attending: Internal Medicine | Admitting: Internal Medicine

## 2020-05-10 ENCOUNTER — Encounter (HOSPITAL_COMMUNITY): Payer: Self-pay | Admitting: Emergency Medicine

## 2020-05-10 ENCOUNTER — Ambulatory Visit (INDEPENDENT_AMBULATORY_CARE_PROVIDER_SITE_OTHER): Payer: Medicare PPO

## 2020-05-10 ENCOUNTER — Other Ambulatory Visit: Payer: Self-pay

## 2020-05-10 DIAGNOSIS — S73102A Unspecified sprain of left hip, initial encounter: Secondary | ICD-10-CM

## 2020-05-10 DIAGNOSIS — M25552 Pain in left hip: Secondary | ICD-10-CM

## 2020-05-10 MED ORDER — KETOROLAC TROMETHAMINE 30 MG/ML IJ SOLN
30.0000 mg | Freq: Once | INTRAMUSCULAR | Status: AC
  Administered 2020-05-10: 30 mg via INTRAMUSCULAR

## 2020-05-10 MED ORDER — PREDNISONE 20 MG PO TABS: 20.0000 mg | ORAL_TABLET | Freq: Every day | ORAL | 0 refills | Status: AC

## 2020-05-10 MED ORDER — KETOROLAC TROMETHAMINE 30 MG/ML IJ SOLN
INTRAMUSCULAR | Status: AC
  Filled 2020-05-10: qty 1

## 2020-05-10 NOTE — ED Triage Notes (Signed)
Pt here for left hip pain starting 4 days ago upon waking; denies injury but sts pain has persisted and has some radiation down left leg

## 2020-05-11 NOTE — ED Provider Notes (Addendum)
[place]    CSN: [id number] Arrival date & time: 05/10/20  1006      History   Chief Complaint Chief Complaint  Patient presents with  . Hip Pain    HPI Jonathan Bridges is a 74 y.o. male comes to the urgent care complaining of left ear pain of 4 days duration. Pain started 4 days ago when he woke up. Denies any significant injury. Patient started taking meloxicam yesterday. Patient denies radiation of pain to the leg and foot. Pain started from the lateral aspect of the left thigh and extends almost to the knee. Pain is aggravated by movement. No known relieving factors. No fever or chills. No dysuria urgency or frequency. No weight loss. Patient denies any worsening lower back pain. He has a history of intermittent lower back pain. No urinary symptoms. No weight changes.Marland Kitchen   HPI  Past Medical History:  Diagnosis Date  . Anxiety   . Basal cell carcinoma   . Chronic rhinitis   . Clotting disorder (HCC)    right leg   . Depression   . Diverticulosis   . Elevated LFTs   . History of hypertension   . Hypercholesterolemia   . Sleep apnea     Patient Active Problem List   Diagnosis Date Noted  . ED (erectile dysfunction) 08/09/2012  . Hypercholesterolemia   . Chronic rhinitis   . Hyperlipidemia 02/29/2012    Past Surgical History:  Procedure Laterality Date  . COLONOSCOPY    . WRIST SURGERY  01/24/2003   Right- ORIF: Dr. Amedeo Plenty       Home Medications    Prior to Admission medications   Medication Sig Start Date End Date Taking? Authorizing Provider  acetaminophen (TYLENOL 8 HOUR ARTHRITIS PAIN) 650 MG CR tablet Take 650 mg by mouth every 8 (eight) hours as needed for pain.    [provider]  aspirin (ASPIRIN EC) 81 MG EC tablet Take 1 tablet (81 mg total) by mouth daily. Swallow whole. 03/24/20   Magnant, Charles L, PA-C  fluticasone (FLONASE) 50 MCG/ACT nasal spray Place 2 sprays into both nostrils daily. 11/27/19   Forrest Moron, MD    hydrochlorothiazide (HYDRODIURIL) 25 MG tablet Take 1 tablet (25 mg total) by mouth daily. 12/20/19   Forrest Moron, MD  loratadine (KLS ALLERCLEAR) 10 MG tablet     [provider]  meloxicam (MOBIC) 15 MG tablet TAKE 1 TABLET(15 MG) BY MOUTH DAILY 01/18/20   Magnant, Charles L, PA-C  methocarbamol (ROBAXIN) 500 MG tablet Take 1 tablet (500 mg total) by mouth every 8 (eight) hours as needed. 03/24/20   Magnant, Gerrianne Scale, PA-C  Multiple Vitamin (MULTIVITAMIN) tablet Take 1 tablet by mouth daily.    [provider]  OMEGA-3 FATTY ACIDS PO Take by mouth. 2 gummies daily    [provider]  oxyCODONE (ROXICODONE) 5 MG immediate release tablet Take 1 tablet (5 mg total) by mouth every 4 (four) hours as needed. Patient not taking: Reported on 04/10/2020 03/24/20 03/24/21  Magnant, Gerrianne Scale, PA-C  pravastatin (PRAVACHOL) 40 MG tablet Take 1 tablet (40 mg total) by mouth daily. Patient not taking: Reported on 04/10/2020 02/26/20   Forrest Moron, MD  predniSONE (DELTASONE) 20 MG tablet Take 1 tablet (20 mg total) by mouth daily for 5 days. 05/10/20 05/15/20  Chase Picket, MD  Pyridoxine HCl (VITAMIN B-6 PO) Take by mouth.    [provider]  thiamine (VITAMIN B-1) 100  MG tablet Take 100 mg daily by mouth.    [provider]    Family History Family History  Problem Relation Age of Onset  . Diabetes Father   . Colon polyps Neg Hx   . Colon cancer Neg Hx   . Esophageal cancer Neg Hx   . Rectal cancer Neg Hx   . Prostate cancer Neg Hx     Social History Social History   Tobacco Use  . Smoking status: Never Smoker  . Smokeless tobacco: Never Used  Substance Use Topics  . Alcohol use: No  . Drug use: No     Allergies   Patient has no known allergies.   Review of Systems Review of Systems  Constitutional: Positive for activity change.  HENT: Negative.   Respiratory: Negative.   Gastrointestinal: Negative.   Musculoskeletal: Positive for  arthralgias and gait problem. Negative for back pain, myalgias, neck pain and neck stiffness.  Neurological: Negative for dizziness, light-headedness and headaches.  Psychiatric/Behavioral: Negative for confusion and decreased concentration.     Physical Exam Triage Vital Signs ED Triage Vitals [05/10/20 1034]  Enc Vitals Group     BP (!) 165/95     Pulse Rate 96     Resp 18     Temp 98.6 F (37 C)     Temp Source Oral     SpO2 95 %     Weight      Height      Head Circumference      Peak Flow      Pain Score 5     Pain Loc      Pain Edu?      Excl. in [place]?    No data found.  Updated Vital Signs BP (!) 165/95 (BP Location: Left Arm)   Pulse 96   Temp 98.6 F (37 C) (Oral)   Resp 18   SpO2 95%   Visual Acuity Right Eye Distance:   Left Eye Distance:   Bilateral Distance:    Right Eye Near:   Left Eye Near:    Bilateral Near:     Physical Exam Vitals and nursing note reviewed.  Constitutional:      General: He is in acute distress.     Appearance: He is not ill-appearing.  Cardiovascular:     Rate and Rhythm: Normal rate and regular rhythm.     Pulses: Normal pulses.     Heart sounds: Normal heart sounds.  Pulmonary:     Effort: Pulmonary effort is normal.     Breath sounds: Normal breath sounds.  Musculoskeletal:        General: No swelling, tenderness or deformity. Normal range of motion.     Comments: Full range of motion of the left hip. No clicks noted. No tenderness of the greater trochanteric tuberosity.  Skin:    General: Skin is warm.     Capillary Refill: Capillary refill takes less than 2 seconds.  Neurological:     General: No focal deficit present.     Mental Status: He is alert and oriented to person, place, and time.      UC Treatments / Results  Labs (all labs ordered are listed, but only abnormal results are displayed) Labs Reviewed - No data to display  EKG   Radiology DG Hip Unilat With Pelvis 2-3 Views Left  Result  Date: 05/10/2020 CLINICAL DATA:  Left hip pain.  No injury. EXAM: DG HIP (WITH OR WITHOUT PELVIS) 2-3V LEFT  COMPARISON:  None. FINDINGS: Pelvic bony ring is intact. Left hip is located without significant joint space narrowing. No gross abnormality to the right hip on the pelvic view. Evidence for disc disease at L4-L5. IMPRESSION: No acute abnormality to the pelvis or left hip. Electronically Signed   By: Markus Daft M.D.   On: 05/10/2020 11:37    Procedures Procedures (including critical care time)  Medications Ordered in UC Medications  ketorolac (TORADOL) 30 MG/ML injection 30 mg (30 mg Intramuscular Given 05/10/20 1142)    Initial Impression / Assessment and Plan / UC Course  I have reviewed the triage vital signs and the nursing notes.  Pertinent labs & imaging results that were available during my care of the patient were reviewed by me and considered in my medical decision making (see chart for details).     1. Left hip pain X-ray of the left hip is negative for osteoarthritis or acute fractures Patient is advised to continue meloxicam Toradol 30 mg IM x1 dose This is likely tendinitis or bursitis If patient symptoms does not improve in the next couple of days, he may benefit from a short course of prednisone. If patient symptoms worsen he may benefit from orthopedic surgery evaluation. Return precautions given. Final Clinical Impressions(s) / UC Diagnoses   Final diagnoses:  Pain in left hip  Hip sprain, left, initial encounter   Discharge Instructions   None    ED Prescriptions    Medication Sig Dispense Auth. Provider   predniSONE (DELTASONE) 20 MG tablet Take 1 tablet (20 mg total) by mouth daily for 5 days. 5 tablet Brienna Bass, Myrene Galas, MD     PDMP not reviewed this encounter.   Chase Picket, MD 05/11/20 [id number]    Chase Picket, MD 05/11/20 [id number]

## 2020-05-15 ENCOUNTER — Encounter: Payer: Medicare PPO | Admitting: Rehabilitative and Restorative Service Providers"

## 2020-05-15 ENCOUNTER — Encounter: Payer: Medicare PPO | Admitting: Physical Therapy

## 2020-05-16 ENCOUNTER — Ambulatory Visit: Payer: Medicare PPO | Admitting: Physical Therapy

## 2020-05-16 ENCOUNTER — Other Ambulatory Visit: Payer: Self-pay

## 2020-05-16 DIAGNOSIS — M6281 Muscle weakness (generalized): Secondary | ICD-10-CM | POA: Diagnosis not present

## 2020-05-16 DIAGNOSIS — M25611 Stiffness of right shoulder, not elsewhere classified: Secondary | ICD-10-CM | POA: Diagnosis not present

## 2020-05-16 DIAGNOSIS — M25511 Pain in right shoulder: Secondary | ICD-10-CM

## 2020-05-16 NOTE — Therapy (Signed)
[facility] Physical Therapy [address] [place], [place], [address] Phone: [phone]   Fax:  [phone]  Physical Therapy Treatment  Patient Details  Name: Jonathan Bridges MRN: [id number] Date of Birth: 1946/01/29 Referring Provider (PT): Dr. Marlou Sa   Encounter Date: 05/16/2020  PT End of Session - 05/16/20 1232    Visit Number  7    Number of Visits  18    Date for PT Re-Evaluation  07/01/20    Authorization Type  [place] additional cert 9/52/8413 - 2/44/01    Authorization - Visit Number  1    Authorization - Number of Visits  6    Progress Note Due on Visit  16    PT Start Time  0272    PT Stop Time  1230    PT Time Calculation (min)  45 min    Activity Tolerance  Patient tolerated treatment well    Behavior During Therapy  [facility] for tasks assessed/performed       Past Medical History:  Diagnosis Date  . Anxiety   . Basal cell carcinoma   . Chronic rhinitis   . Clotting disorder (HCC)    right leg   . Depression   . Diverticulosis   . Elevated LFTs   . History of hypertension   . Hypercholesterolemia   . Sleep apnea     Past Surgical History:  Procedure Laterality Date  . COLONOSCOPY    . WRIST SURGERY  01/24/2003   Right- ORIF: Dr. Amedeo Plenty    There were no vitals filed for this visit.  Subjective Assessment - 05/16/20 1155    Subjective  relays the Rt shoulder is doing well, he can still feel it at times but it is much better overall. He does relay he had hip sprain and saw MD about this.    Limitations  Lifting;House hold activities    Pain Onset  More than a month ago                        [facility] Adult PT Treatment/Exercise - 05/16/20 0001      Shoulder Exercises: Standing   Flexion  Strengthening;Both    Shoulder Flexion Weight (lbs)  3    Flexion Limitations  3X10 reps    ABduction  Both;Strengthening    Shoulder ABduction Weight (lbs)  1    ABduction Limitations  3X10 reps       Shoulder Exercises: ROM/Strengthening   UBE (Upper Arm Bike)  Lvl 3 2.24mns fwd/rev each    Other ROM/Strengthening Exercises  machines: seated row 25 lbs 2X15, lat pull 25 lbs 2X15, chest press 15 lbs 2X15. Cable IR/ER 5 lbs 3X10 ea                  PT Long Term Goals - 05/06/20 0912      PT LONG TERM GOAL #1   Title  Patient will demonstrate/report pain at worst less than or equal to 2/10 to facilitate minimal limitation in daily activity secondary to pain symptoms.    Time  6    Period  Weeks    Status  On-going    Target Date  06/17/20      PT LONG TERM GOAL #2   Title  Patient will demonstrate independent use of home exercise program to facilitate ability to maintain/progress functional gains from skilled physical therapy services.    Time  6  Period  Weeks    Status  On-going    Target Date  06/17/20      PT LONG TERM GOAL #3   Title  Pt. will demonstrate posture at PLOF s deviation/cues to improve scapulothoracic mobility for elevation attempts.    Time  6    Period  Weeks    Status  On-going    Target Date  06/17/20      PT LONG TERM GOAL #4   Title  Pt. will demonstrate Rt GH AROM WFL s symptoms to facilitate ability to perform daily and recreational overhead reaching, dressing at PLOF.    Time  6    Period  Weeks    Status  Partially Met    Target Date  06/17/20      PT LONG TERM GOAL #5   Title  Patient will demonstrate Rt UE MMT 5/5 throughout to facilitate usual lifting, carrying in functional activity to PLOF s limitation.    Time  6    Period  Weeks    Status  On-going    Target Date  06/17/20            Plan - 05/16/20 1233    Clinical Impression Statement  Session focused on showing him how to safely use resistance equipment for shoulder strengthening that he can start using at gym to augment his strength gains with PT. He showed good understanding of this without reports of pain. He is doing well with shoulder ROM but does still  lack strength. Continue POC    Personal Factors and Comorbidities  Comorbidity 3+    Comorbidities  anxiety, history of clotting disorder Rt leg, HTN    Examination-Activity Limitations  Reach Overhead;Carry;Lift;Dressing    Examination-Participation Restrictions  Community Activity;Yard Work;Driving    Stability/Clinical Decision Making  Stable/Uncomplicated    Rehab Potential  Good    PT Frequency  2x / week    PT Duration  6 weeks    PT Treatment/Interventions  ADLs/Self Care Home Management;Electrical Stimulation;Iontophoresis 33m/ml Dexamethasone;Moist Heat;Ultrasound;Neuromuscular re-education;Balance training;Therapeutic exercise;Therapeutic activities;Functional mobility training;Patient/family education;Manual techniques;Dry needling;Passive range of motion;Taping;Spinal Manipulations;Joint Manipulations    PT Next Visit Plan  Recommend 2x/week for 6 weeks with additional authorized visits required.  Focus on strength gains    PT Home Exercise Plan  [id number]    Consulted and Agree with Plan of Care  Patient       Patient will benefit from skilled therapeutic intervention in order to improve the following deficits and impairments:  Decreased range of motion, Pain, Impaired UE functional use, Decreased activity tolerance, Hypomobility, Impaired flexibility, Decreased mobility, Decreased strength  Visit Diagnosis: Right shoulder pain, unspecified chronicity  Muscle weakness (generalized)  Stiffness of right shoulder, not elsewhere classified     Problem List Patient Active Problem List   Diagnosis Date Noted  . ED (erectile dysfunction) 08/09/2012  . Hypercholesterolemia   . Chronic rhinitis   . Hyperlipidemia 02/29/2012    BSilvestre Mesi6/03/2020, 12:35 PM  [facility] [address] [address]Phone: [phone]  Fax:  [phone] Name: Jonathan LUECKEMRN: [id number] of Birth: [birth year]

## 2020-05-17 ENCOUNTER — Encounter (HOSPITAL_COMMUNITY): Payer: Self-pay

## 2020-05-17 ENCOUNTER — Other Ambulatory Visit: Payer: Self-pay

## 2020-05-17 ENCOUNTER — Ambulatory Visit (HOSPITAL_COMMUNITY)
Admission: EM | Admit: 2020-05-17 | Discharge: 2020-05-17 | Disposition: A | Discharge status: Home / Self Care | Payer: Medicare PPO | Attending: Family Medicine | Admitting: Family Medicine

## 2020-05-17 DIAGNOSIS — M25552 Pain in left hip: Secondary | ICD-10-CM | POA: Diagnosis not present

## 2020-05-17 DIAGNOSIS — R030 Elevated blood-pressure reading, without diagnosis of hypertension: Secondary | ICD-10-CM

## 2020-05-17 MED ORDER — TRAMADOL HCL 50 MG PO TABS: 50.0000 mg | ORAL_TABLET | Freq: Four times a day (QID) | ORAL | 0 refills | Status: DC | PRN

## 2020-05-17 MED ORDER — KETOROLAC TROMETHAMINE 30 MG/ML IJ SOLN
30.0000 mg | Freq: Once | INTRAMUSCULAR | Status: AC
  Administered 2020-05-17: 30 mg via INTRAMUSCULAR

## 2020-05-17 MED ORDER — KETOROLAC TROMETHAMINE 30 MG/ML IJ SOLN
INTRAMUSCULAR | Status: AC
  Filled 2020-05-17: qty 1

## 2020-05-17 MED ORDER — PREDNISONE 10 MG (21) PO TBPK: ORAL_TABLET | Freq: Every day | ORAL | 0 refills | Status: AC

## 2020-05-17 NOTE — ED Provider Notes (Signed)
[facility]   [id number] 05/17/20 Arrival Time: 6644  ASSESSMENT & PLAN:  1. Left hip pain   2. Elevated blood pressure reading     No indication for hip imaging at this time. Films dated 05/10/2020 reviewed by me.  Begin: Meds ordered this encounter  Medications  . predniSONE (STERAPRED UNI-PAK 21 TAB) 10 MG (21) TBPK tablet    Sig: Take by mouth daily. Take as directed.    Dispense:  21 tablet    Refill:  0  . traMADol (ULTRAM) 50 MG tablet    Sig: Take 1 tablet (50 mg total) by mouth every 6 (six) hours as needed.    Dispense:  15 tablet    Refill:  0  . ketorolac (TORADOL) 30 MG/ML injection 30 mg     Discharge Instructions     Be aware, you have been prescribed pain medications that may cause drowsiness. Do not combine with alcohol or other illicit drugs. Please do not drive, operate heavy machinery, or take part in activities that require making important decisions while on this medication as your judgement may be clouded.   Your blood pressure was noted to be elevated during your visit today. If you are currently taking medication for high blood pressure, please ensure you are taking this as directed. If you do not have a history of high blood pressure and your blood pressure remains persistently elevated, you may need to begin taking a medication at some point. You may return here within the next few days to recheck if unable to see your primary care provider or if do not have a one.  BP (!) 160/86   Pulse 85   Temp 98.7 F (37.1 C) (Oral)   Resp 18   Ht 5\' 11"  (1.803 m)   Wt 93 kg   SpO2 97%   BMI 28.59 kg/m        Recommend: Follow-up Information    [place].   Why: If worsening or failing to improve as anticipated. Contact information: [place]       Forrest Moron, MD.   Specialty: Internal Medicine Why: To recheck your blood  pressure. Contact information: [address] [place] [id number] [phone]            [place] Controlled Substances Registry consulted for this patient. I feel the risk/benefit ratio today is favorable for proceeding with this prescription for a controlled substance. Medication sedation precautions given.  Reviewed expectations re: course of current medical issues. Questions answered. Outlined signs and symptoms indicating need for more acute intervention. Patient verbalized understanding. After Visit Summary given.  SUBJECTIVE: History from: patient. Jonathan Bridges is a 74 y.o. male who reports fairly persistent moderate pain of his left hip. Note from visit 05/10/2020 reviewed by me; prednisone 20 mg x 5 days; questions a little help. Pain now more persistent; interfering with sleep. Occasionally feels pain down front/side of left thigh. Normal bowel/bladder habits. Ambulatory without assistance. No extremity sensation changes or weakness. OTC analgesics without much relief. No trauma.  Past Surgical History:  Procedure Laterality Date  . COLONOSCOPY    . WRIST SURGERY  01/24/2003   Right- ORIF: Dr. Amedeo Plenty    Increased blood pressure noted today. Reports that he is treated for HTN.  He reports taking medications as instructed, no chest pain on exertion, no dyspnea on exertion, no swelling  of ankles, no orthostatic dizziness or lightheadedness, no orthopnea or paroxysmal nocturnal dyspnea, no palpitations and no intermittent claudication symptoms.  OBJECTIVE:  Vitals:   05/17/20 1034 05/17/20 1035  BP: (!) 160/86   Pulse: 85   Resp: 18   Temp: 98.7 F (37.1 C)   TempSrc: Oral   SpO2: 97%   Weight:  93 kg  Height:  5\' 11"  (1.803 m)    General appearance: alert; no distress HEENT: [place]; AT Neck: supple with FROM Resp: unlabored respirations Extremities: . LLE: warm with well perfused appearance; fairly well localized moderate tenderness over  right trochanter; without gross deformities; swelling: none; bruising: none; hip ROM: normal, with discomfort; no calf swelling Skin: warm and dry; no visible rashes Neurologic: gait normal; normal sensation and strength of RLE Psychological: alert and cooperative; normal mood and affect    No Known Allergies  Past Medical History:  Diagnosis Date  . Anxiety   . Basal cell carcinoma   . Chronic rhinitis   . Clotting disorder (HCC)    right leg   . Depression   . Diverticulosis   . Elevated LFTs   . History of hypertension   . Hypercholesterolemia   . Sleep apnea    Social History   Socioeconomic History  . Marital status: Married    Spouse name: Not on file  . Number of children: Not on file  . Years of education: Not on file  . Highest education level: Not on file  Occupational History  . Occupation: Architectural technologist: [employer]  Tobacco Use  . Smoking status: Never Smoker  . Smokeless tobacco: Never Used  Substance and Sexual Activity  . Alcohol use: No  . Drug use: No  . Sexual activity: Yes  Other Topics Concern  . Not on file  Social History Narrative   Exercise walking         Social Determinants of Health   Financial Resource Strain:   . Difficulty of Paying Living Expenses:   Food Insecurity:   . Worried About Charity fundraiser in the Last Year:   . Arboriculturist in the Last Year:   Transportation Needs:   . Film/video editor (Medical):   Marland Kitchen Lack of Transportation (Non-Medical):   Physical Activity:   . Days of Exercise per Week:   . Minutes of Exercise per Session:   Stress:   . Feeling of Stress :   Social Connections:   . Frequency of Communication with Friends and Family:   . Frequency of Social Gatherings with Friends and Family:   . Attends Religious Services:   . Active Member of Clubs or Organizations:   . Attends Archivist Meetings:   Marland Kitchen Marital Status:    Family History  Problem Relation Age of  Onset  . Diabetes Father   . Colon polyps Neg Hx   . Colon cancer Neg Hx   . Esophageal cancer Neg Hx   . Rectal cancer Neg Hx   . Prostate cancer Neg Hx    Past Surgical History:  Procedure Laterality Date  . COLONOSCOPY    . WRIST SURGERY  01/24/2003   Right- ORIF: Dr. Odis Luster, MD 05/17/20 1116

## 2020-05-17 NOTE — Discharge Instructions (Addendum)
Be aware, you have been prescribed pain medications that may cause drowsiness. Do not combine with alcohol or other illicit drugs. Please do not drive, operate heavy machinery, or take part in activities that require making important decisions while on this medication as your judgement may be clouded.   Your blood pressure was noted to be elevated during your visit today. If you are currently taking medication for high blood pressure, please ensure you are taking this as directed. If you do not have a history of high blood pressure and your blood pressure remains persistently elevated, you may need to begin taking a medication at some point. You may return here within the next few days to recheck if unable to see your primary care provider or if do not have a one.  BP (!) 160/86    Pulse 85    Temp 98.7 F (37.1 C) (Oral)    Resp 18    Ht 5\' 11"  (1.803 m)    Wt 93 kg    SpO2 97%    BMI 28.59 kg/m

## 2020-05-17 NOTE — ED Triage Notes (Signed)
Pt c/o 9/10 sharp throbbing left hip painx1.5 wks. Pt states the hip pain radiated down left leg and his left calf is numb. Pt denies injury. Pt walked well to exam room.

## 2020-05-22 ENCOUNTER — Encounter: Payer: Self-pay | Admitting: Family Medicine

## 2020-05-22 ENCOUNTER — Telehealth: Payer: Self-pay | Admitting: Orthopedic Surgery

## 2020-05-22 ENCOUNTER — Other Ambulatory Visit: Payer: Self-pay

## 2020-05-22 ENCOUNTER — Ambulatory Visit: Payer: Medicare PPO | Admitting: Family Medicine

## 2020-05-22 VITALS — BP 171/97 | HR 98 | Temp 97.6°F | Ht 71.0 in | Wt 203.4 lb

## 2020-05-22 DIAGNOSIS — M7072 Other bursitis of hip, left hip: Secondary | ICD-10-CM | POA: Diagnosis not present

## 2020-05-22 DIAGNOSIS — E782 Mixed hyperlipidemia: Secondary | ICD-10-CM

## 2020-05-22 MED ORDER — PRAVASTATIN SODIUM 40 MG PO TABS: 40.0000 mg | ORAL_TABLET | Freq: Every day | ORAL | 1 refills | Status: DC

## 2020-05-22 MED ORDER — TRAMADOL HCL 50 MG PO TABS: 50.0000 mg | ORAL_TABLET | Freq: Four times a day (QID) | ORAL | 0 refills | Status: DC | PRN

## 2020-05-22 MED ORDER — DICLOFENAC SODIUM 75 MG PO TBEC: 75.0000 mg | DELAYED_RELEASE_TABLET | Freq: Two times a day (BID) | ORAL | 0 refills | Status: DC

## 2020-05-22 NOTE — Telephone Encounter (Signed)
Pt called stating he recently sprained his hip and has an appt on 05/23/20 with Dr. Marlou Sa and wanted to know wether he could bring up his hip during that appt or if he had to make a separate appt to discuss it?  [phone]

## 2020-05-22 NOTE — Patient Instructions (Addendum)
When you see Dr. Marlou Sa tomorrow, try to see if you can get him to check your hip also.  Call his office today and see if they would let him do that.  Take diclofenac 75 mg 1 twice daily as needed for pain and inflammation in hip.  Do not take the meloxicam while on the diclofenac.  This medicine can upset your stomach, so if you get heartburn or stomach pain discontinue it.  Take Tylenol 500 mg (acetaminophen) 2 pills 3 times daily maximum for pain.  Do not exceed 3000 mg in 24 hours.  Finish your prednisone, but I am not going to continue that at this time.  Tramadol 1 every 6 hours only when needed for bad pain  Do gentle exercises and stretching.  The below exercises may be of some help.  You may need physical therapy at some point.  I recommend getting an inflatable seat cushion for riding airplanes long distances and for other times seated as needed.  Return if necessary   Hip Exercises Ask your health care provider which exercises are safe for you. Do exercises exactly as told by your health care provider and adjust them as directed. It is normal to feel mild stretching, pulling, tightness, or discomfort as you do these exercises. Stop right away if you feel sudden pain or your pain gets worse. Do not begin these exercises until told by your health care provider. Stretching and range-of-motion exercises These exercises warm up your muscles and joints and improve the movement and flexibility of your hip. These exercises also help to relieve pain, numbness, and tingling. You may be asked to limit your range of motion if you had a hip replacement. Talk to your health care provider about these restrictions. Hamstrings, supine  1. Lie on your back (supine position). 2. Loop a belt or towel over the ball of your left / right foot. The ball of your foot is on the walking surface, right under your toes. 3. Straighten your left / right knee and slowly pull on the belt or towel to raise your  leg until you feel a gentle stretch behind your knee (hamstring). ? Do not let your knee bend while you do this. ? Keep your other leg flat on the floor. 4. Hold this position for __________ seconds. 5. Slowly return your leg to the starting position. Repeat __________ times. Complete this exercise __________ times a day. Hip rotation  1. Lie on your back on a firm surface. 2. With your left / right hand, gently pull your left / right knee toward the shoulder that is on the same side of the body. Stop when your knee is pointing toward the ceiling. 3. Hold your left / right ankle with your other hand. 4. Keeping your knee steady, gently pull your left / right ankle toward your other shoulder until you feel a stretch in your buttocks. ? Keep your hips and shoulders firmly planted while you do this stretch. 5. Hold this position for __________ seconds. Repeat __________ times. Complete this exercise __________ times a day. Seated stretch This exercise is sometimes called hamstrings and adductors stretch. 1. Sit on the floor with your legs stretched wide. Keep your knees straight during this exercise. 2. Keeping your head and back in a straight line, bend at your waist to reach for your left foot (position A). You should feel a stretch in your right inner thigh (adductors). 3. Hold this position for __________ seconds. Then slowly return to  the upright position. 4. Keeping your head and back in a straight line, bend at your waist to reach forward (position B). You should feel a stretch behind both of your thighs and knees (hamstrings). 5. Hold this position for __________ seconds. Then slowly return to the upright position. 6. Keeping your head and back in a straight line, bend at your waist to reach for your right foot (position C). You should feel a stretch in your left inner thigh (adductors). 7. Hold this position for __________ seconds. Then slowly return to the upright position. Repeat  __________ times. Complete this exercise __________ times a day. Lunge This exercise stretches the muscles of the hip (hip flexors). 1. Place your left / right knee on the floor and bend your other knee so that is directly over your ankle. You should be half-kneeling. 2. Keep good posture with your head over your shoulders. 3. Tighten your buttocks to point your tailbone downward. This will prevent your back from arching too much. 4. You should feel a gentle stretch in the front of your left / right thigh and hip. If you do not feel a stretch, slide your other foot forward slightly and then slowly lunge forward with your chest up until your knee once again lines up over your ankle. ? Make sure your tailbone continues to point downward. 5. Hold this position for __________ seconds. 6. Slowly return to the starting position. Repeat __________ times. Complete this exercise __________ times a day. Strengthening exercises These exercises build strength and endurance in your hip. Endurance is the ability to use your muscles for a long time, even after they get tired. Bridge This exercise strengthens the muscles of your hip (hip extensors). 1. Lie on your back on a firm surface with your knees bent and your feet flat on the floor. 2. Tighten your buttocks muscles and lift your bottom off the floor until the trunk of your body and your hips are level with your thighs. ? Do not arch your back. ? You should feel the muscles working in your buttocks and the back of your thighs. If you do not feel these muscles, slide your feet 1-2 inches (2.5-5 cm) farther away from your buttocks. 3. Hold this position for __________ seconds. 4. Slowly lower your hips to the starting position. 5. Let your muscles relax completely between repetitions. Repeat __________ times. Complete this exercise __________ times a day. Straight leg raises, side-lying This exercise strengthens the muscles that move the hip joint away  from the center of the body (hip abductors). 1. Lie on your side with your left / right leg in the top position. Lie so your head, shoulder, hip, and knee line up. You may bend your bottom knee slightly to help you balance. 2. Roll your hips slightly forward, so your hips are stacked directly over each other and your left / right knee is facing forward. 3. Leading with your heel, lift your top leg 4-6 inches (10-15 cm). You should feel the muscles in your top hip lifting. ? Do not let your foot drift forward. ? Do not let your knee roll toward the ceiling. 4. Hold this position for __________ seconds. 5. Slowly return to the starting position. 6. Let your muscles relax completely between repetitions. Repeat __________ times. Complete this exercise __________ times a day. Straight leg raises, side-lying This exercise strengthens the muscles that move the hip joint toward the center of the body (hip adductors). 1. Lie on your side with  your left / right leg in the bottom position. Lie so your head, shoulder, hip, and knee line up. You may place your upper foot in front to help you balance. 2. Roll your hips slightly forward, so your hips are stacked directly over each other and your left / right knee is facing forward. 3. Tense the muscles in your inner thigh and lift your bottom leg 4-6 inches (10-15 cm). 4. Hold this position for __________ seconds. 5. Slowly return to the starting position. 6. Let your muscles relax completely between repetitions. Repeat __________ times. Complete this exercise __________ times a day. Straight leg raises, supine This exercise strengthens the muscles in the front of your thigh (quadriceps). 1. Lie on your back (supine position) with your left / right leg extended and your other knee bent. 2. Tense the muscles in the front of your left / right thigh. You should see your kneecap slide up or see increased dimpling just above your knee. 3. Keep these muscles tight  as you raise your leg 4-6 inches (10-15 cm) off the floor. Do not let your knee bend. 4. Hold this position for __________ seconds. 5. Keep these muscles tense as you lower your leg. 6. Relax the muscles slowly and completely between repetitions. Repeat __________ times. Complete this exercise __________ times a day. Hip abductors, standing This exercise strengthens the muscles that move the leg and hip joint away from the center of the body (hip abductors). 1. Tie one end of a rubber exercise band or tubing to a secure surface, such as a chair, table, or pole. 2. Loop the other end of the band or tubing around your left / right ankle. 3. Keeping your ankle with the band or tubing directly opposite the secured end, step away until there is tension in the tubing or band. Hold on to a chair, table, or pole as needed for balance. 4. Lift your left / right leg out to your side. While you do this: ? Keep your back upright. ? Keep your shoulders over your hips. ? Keep your toes pointing forward. ? Make sure to use your hip muscles to slowly lift your leg. Do not tip your body or forcefully lift your leg. 5. Hold this position for __________ seconds. 6. Slowly return to the starting position. Repeat __________ times. Complete this exercise __________ times a day. Squats This exercise strengthens the muscles in the front of your thigh (quadriceps). 1. Stand in a door frame so your feet and knees are in line with the frame. You may place your hands on the frame for balance. 2. Slowly bend your knees and lower your hips like you are going to sit in a chair. ? Keep your lower legs in a straight-up-and-down position. ? Do not let your hips go lower than your knees. ? Do not bend your knees lower than told by your health care provider. ? If your hip pain increases, do not bend as low. 3. Hold this position for ___________ seconds. 4. Slowly push with your legs to return to standing. Do not use your  hands to pull yourself to standing. Repeat __________ times. Complete this exercise __________ times a day. This information is not intended to replace advice given to you by your health care provider. Make sure you discuss any questions you have with your health care provider. Document Revised: 07/05/2019 Document Reviewed: 10/10/2018 Elsevier Patient Education  [employer].    If you have lab work done today you  will be contacted with your lab results within the next 2 weeks.  If you have not heard from [place] then please contact us. The fastest way to get your results is to register for My Chart.   IF you received an x-ray today, you will receive an invoice from [facility]. Please contact [facility] at [phone] with questions or concerns regarding your invoice.   IF you received labwork today, you will receive an invoice from [facility]. Please contact [facility] at [phone] with questions or concerns regarding your invoice.   Our billing staff will not be able to assist you with questions regarding bills from these companies.  You will be contacted with the lab results as soon as they are available. The fastest way to get your results is to activate your My Chart account. Instructions are located on the last page of this paperwork. If you have not heard from [place] regarding the results in 2 weeks, please contact this office.

## 2020-05-22 NOTE — Progress Notes (Signed)
Patient ID: Jonathan Bridges, male    DOB: November 11, 1946  Age: 74 y.o. MRN: [id number]  Chief Complaint  Patient presents with  . Hip Pain    Pt stated that he has been having some Lt hip pain for the past 2/3 weeks. Has has seen other providers for this pain    Subjective:   74 year old man who awakened from an afternoon nap 1 day a couple of weeks ago with pain in his left hip.  He had had no injury.  He had not done anything out of the ordinary.  Several weeks earlier he had had shoulder surgery, and was still on some meloxicam from that.  He went to the emergency room and was given a low-dose of prednisone.  Pain continues to persist and he went back a few days ago and got a prednisone Dosepak.  He also has been taking Tylenol.  He took some tramadol as needed for the pain.  He continues to hurt.  He can hurt when he seated he can hurt when he walks around he can hurt in bed.  X-ray in the urgent care did not show any arthritic problems.  He has an appointment with Dr. Marlou Sa tomorrow for his shoulder.  Current allergies, medications, problem list, past/family and social histories reviewed.  Objective:  BP (!) 171/97 (BP Location: Right Arm, Patient Position: Sitting, Cuff Size: Large)   Pulse 98   Temp 97.6 F (36.4 C) (Temporal)   Ht 5\' 11"  (1.803 m)   Wt 203 lb 6.4 oz (92.3 kg)   SpO2 95%   BMI 28.37 kg/m   No low back tenderness good range of motion of the low back.  No real significant tenderness of the hip this morning though it hurts when he walks around.  He is not limping right this minute but he does at times.  Assessment & Plan:   Assessment: 1. Iliopsoas bursitis of left hip   2. Mixed hyperlipidemia       Plan: See instructions  At the end of the visit he asked for refill on his lipid medicine.  I asked him to come back this fall summer or fall.  Orders Placed This Encounter  Procedures  . Ambulatory referral to Orthopedic Surgery    Referral Priority:   Routine     Referral Type:   Surgical    Referral Reason:   Specialty Services Required    Referred to Provider:   Meredith Pel, MD    Requested Specialty:   Orthopedic Surgery    Number of Visits Requested:   1    Meds ordered this encounter  Medications  . diclofenac (VOLTAREN) 75 MG EC tablet    Sig: Take 1 tablet (75 mg total) by mouth 2 (two) times daily.    Dispense:  60 tablet    Refill:  0  . traMADol (ULTRAM) 50 MG tablet    Sig: Take 1 tablet (50 mg total) by mouth every 6 (six) hours as needed.    Dispense:  40 tablet    Refill:  0  . pravastatin (PRAVACHOL) 40 MG tablet    Sig: Take 1 tablet (40 mg total) by mouth daily.    Dispense:  90 tablet    Refill:  1         Patient Instructions    When you see Dr. Marlou Sa tomorrow, try to see if you can get him to check your hip also.  Call his office today  and see if they would let him do that.  Take diclofenac 75 mg 1 twice daily as needed for pain and inflammation in hip.  Do not take the meloxicam while on the diclofenac.  This medicine can upset your stomach, so if you get heartburn or stomach pain discontinue it.  Take Tylenol 500 mg (acetaminophen) 2 pills 3 times daily maximum for pain.  Do not exceed 3000 mg in 24 hours.  Finish your prednisone, but I am not going to continue that at this time.  Tramadol 1 every 6 hours only when needed for bad pain  Do gentle exercises and stretching.  The below exercises may be of some help.  You may need physical therapy at some point.  I recommend getting an inflatable seat cushion for riding airplanes long distances and for other times seated as needed.  Return if necessary   Hip Exercises Ask your health care provider which exercises are safe for you. Do exercises exactly as told by your health care provider and adjust them as directed. It is normal to feel mild stretching, pulling, tightness, or discomfort as you do these exercises. Stop right away if you feel sudden  pain or your pain gets worse. Do not begin these exercises until told by your health care provider. Stretching and range-of-motion exercises These exercises warm up your muscles and joints and improve the movement and flexibility of your hip. These exercises also help to relieve pain, numbness, and tingling. You may be asked to limit your range of motion if you had a hip replacement. Talk to your health care provider about these restrictions. Hamstrings, supine  1. Lie on your back (supine position). 2. Loop a belt or towel over the ball of your left / right foot. The ball of your foot is on the walking surface, right under your toes. 3. Straighten your left / right knee and slowly pull on the belt or towel to raise your leg until you feel a gentle stretch behind your knee (hamstring). ? Do not let your knee bend while you do this. ? Keep your other leg flat on the floor. 4. Hold this position for __________ seconds. 5. Slowly return your leg to the starting position. Repeat __________ times. Complete this exercise __________ times a day. Hip rotation  1. Lie on your back on a firm surface. 2. With your left / right hand, gently pull your left / right knee toward the shoulder that is on the same side of the body. Stop when your knee is pointing toward the ceiling. 3. Hold your left / right ankle with your other hand. 4. Keeping your knee steady, gently pull your left / right ankle toward your other shoulder until you feel a stretch in your buttocks. ? Keep your hips and shoulders firmly planted while you do this stretch. 5. Hold this position for __________ seconds. Repeat __________ times. Complete this exercise __________ times a day. Seated stretch This exercise is sometimes called hamstrings and adductors stretch. 1. Sit on the floor with your legs stretched wide. Keep your knees straight during this exercise. 2. Keeping your head and back in a straight line, bend at your waist to reach  for your left foot (position A). You should feel a stretch in your right inner thigh (adductors). 3. Hold this position for __________ seconds. Then slowly return to the upright position. 4. Keeping your head and back in a straight line, bend at your waist to reach forward (position B). You  should feel a stretch behind both of your thighs and knees (hamstrings). 5. Hold this position for __________ seconds. Then slowly return to the upright position. 6. Keeping your head and back in a straight line, bend at your waist to reach for your right foot (position C). You should feel a stretch in your left inner thigh (adductors). 7. Hold this position for __________ seconds. Then slowly return to the upright position. Repeat __________ times. Complete this exercise __________ times a day. Lunge This exercise stretches the muscles of the hip (hip flexors). 1. Place your left / right knee on the floor and bend your other knee so that is directly over your ankle. You should be half-kneeling. 2. Keep good posture with your head over your shoulders. 3. Tighten your buttocks to point your tailbone downward. This will prevent your back from arching too much. 4. You should feel a gentle stretch in the front of your left / right thigh and hip. If you do not feel a stretch, slide your other foot forward slightly and then slowly lunge forward with your chest up until your knee once again lines up over your ankle. ? Make sure your tailbone continues to point downward. 5. Hold this position for __________ seconds. 6. Slowly return to the starting position. Repeat __________ times. Complete this exercise __________ times a day. Strengthening exercises These exercises build strength and endurance in your hip. Endurance is the ability to use your muscles for a long time, even after they get tired. Bridge This exercise strengthens the muscles of your hip (hip extensors). 1. Lie on your back on a firm surface with your  knees bent and your feet flat on the floor. 2. Tighten your buttocks muscles and lift your bottom off the floor until the trunk of your body and your hips are level with your thighs. ? Do not arch your back. ? You should feel the muscles working in your buttocks and the back of your thighs. If you do not feel these muscles, slide your feet 1-2 inches (2.5-5 cm) farther away from your buttocks. 3. Hold this position for __________ seconds. 4. Slowly lower your hips to the starting position. 5. Let your muscles relax completely between repetitions. Repeat __________ times. Complete this exercise __________ times a day. Straight leg raises, side-lying This exercise strengthens the muscles that move the hip joint away from the center of the body (hip abductors). 1. Lie on your side with your left / right leg in the top position. Lie so your head, shoulder, hip, and knee line up. You may bend your bottom knee slightly to help you balance. 2. Roll your hips slightly forward, so your hips are stacked directly over each other and your left / right knee is facing forward. 3. Leading with your heel, lift your top leg 4-6 inches (10-15 cm). You should feel the muscles in your top hip lifting. ? Do not let your foot drift forward. ? Do not let your knee roll toward the ceiling. 4. Hold this position for __________ seconds. 5. Slowly return to the starting position. 6. Let your muscles relax completely between repetitions. Repeat __________ times. Complete this exercise __________ times a day. Straight leg raises, side-lying This exercise strengthens the muscles that move the hip joint toward the center of the body (hip adductors). 1. Lie on your side with your left / right leg in the bottom position. Lie so your head, shoulder, hip, and knee line up. You may place your upper  foot in front to help you balance. 2. Roll your hips slightly forward, so your hips are stacked directly over each other and your  left / right knee is facing forward. 3. Tense the muscles in your inner thigh and lift your bottom leg 4-6 inches (10-15 cm). 4. Hold this position for __________ seconds. 5. Slowly return to the starting position. 6. Let your muscles relax completely between repetitions. Repeat __________ times. Complete this exercise __________ times a day. Straight leg raises, supine This exercise strengthens the muscles in the front of your thigh (quadriceps). 1. Lie on your back (supine position) with your left / right leg extended and your other knee bent. 2. Tense the muscles in the front of your left / right thigh. You should see your kneecap slide up or see increased dimpling just above your knee. 3. Keep these muscles tight as you raise your leg 4-6 inches (10-15 cm) off the floor. Do not let your knee bend. 4. Hold this position for __________ seconds. 5. Keep these muscles tense as you lower your leg. 6. Relax the muscles slowly and completely between repetitions. Repeat __________ times. Complete this exercise __________ times a day. Hip abductors, standing This exercise strengthens the muscles that move the leg and hip joint away from the center of the body (hip abductors). 1. Tie one end of a rubber exercise band or tubing to a secure surface, such as a chair, table, or pole. 2. Loop the other end of the band or tubing around your left / right ankle. 3. Keeping your ankle with the band or tubing directly opposite the secured end, step away until there is tension in the tubing or band. Hold on to a chair, table, or pole as needed for balance. 4. Lift your left / right leg out to your side. While you do this: ? Keep your back upright. ? Keep your shoulders over your hips. ? Keep your toes pointing forward. ? Make sure to use your hip muscles to slowly lift your leg. Do not tip your body or forcefully lift your leg. 5. Hold this position for __________ seconds. 6. Slowly return to the starting  position. Repeat __________ times. Complete this exercise __________ times a day. Squats This exercise strengthens the muscles in the front of your thigh (quadriceps). 1. Stand in a door frame so your feet and knees are in line with the frame. You may place your hands on the frame for balance. 2. Slowly bend your knees and lower your hips like you are going to sit in a chair. ? Keep your lower legs in a straight-up-and-down position. ? Do not let your hips go lower than your knees. ? Do not bend your knees lower than told by your health care provider. ? If your hip pain increases, do not bend as low. 3. Hold this position for ___________ seconds. 4. Slowly push with your legs to return to standing. Do not use your hands to pull yourself to standing. Repeat __________ times. Complete this exercise __________ times a day. This information is not intended to replace advice given to you by your health care provider. Make sure you discuss any questions you have with your health care provider. Document Revised: 07/05/2019 Document Reviewed: 10/10/2018 Elsevier Patient Education  [employer].    If you have lab work done today you will be contacted with your lab results within the next 2 weeks.  If you have not heard from [place] then please contact us.  The fastest way to get your results is to register for My Chart.   IF you received an x-ray today, you will receive an invoice from [facility]. Please contact [facility] at [phone] with questions or concerns regarding your invoice.   IF you received labwork today, you will receive an invoice from [facility]. Please contact [facility] at [phone] with questions or concerns regarding your invoice.   Our billing staff will not be able to assist you with questions regarding bills from these companies.  You will be contacted with the lab results as soon as they are available. The fastest way to get your results is to  activate your My Chart account. Instructions are located on the last page of this paperwork. If you have not heard from [place] regarding the results in 2 weeks, please contact this office.        Return if symptoms worsen or fail to improve.   Ruben Reason, MD 05/22/2020

## 2020-05-22 NOTE — Telephone Encounter (Signed)
IC advised this would be fine.

## 2020-05-23 ENCOUNTER — Other Ambulatory Visit: Payer: Self-pay | Admitting: Surgical

## 2020-05-23 ENCOUNTER — Ambulatory Visit (INDEPENDENT_AMBULATORY_CARE_PROVIDER_SITE_OTHER): Payer: Medicare PPO | Admitting: Orthopedic Surgery

## 2020-05-23 ENCOUNTER — Telehealth: Payer: Self-pay

## 2020-05-23 ENCOUNTER — Encounter: Payer: Self-pay | Admitting: Orthopedic Surgery

## 2020-05-23 DIAGNOSIS — G8929 Other chronic pain: Secondary | ICD-10-CM

## 2020-05-23 DIAGNOSIS — M5442 Lumbago with sciatica, left side: Secondary | ICD-10-CM

## 2020-05-23 MED ORDER — PREDNISONE 5 MG (21) PO TBPK: ORAL_TABLET | ORAL | 0 refills | Status: AC

## 2020-05-23 MED ORDER — ACETAMINOPHEN-CODEINE #3 300-30 MG PO TABS: 1.0000 | ORAL_TABLET | Freq: Three times a day (TID) | ORAL | 0 refills | Status: AC | PRN

## 2020-05-23 MED ORDER — METHOCARBAMOL 500 MG PO TABS: 500.0000 mg | ORAL_TABLET | Freq: Three times a day (TID) | ORAL | 0 refills | Status: AC | PRN

## 2020-05-23 NOTE — Progress Notes (Signed)
Office Visit Note   Patient: Jonathan Bridges           Date of Birth: [birth year]           MRN: [id number] Visit Date: 05/23/2020 Requested by: Forrest Moron, MD [place],  [place] [address] PCP: Forrest Moron, MD  Subjective: Chief Complaint  Patient presents with  . Right Shoulder - Follow-up  . Left Hip - Pain    HPI: Jonathan Bridges is a patient who is here to follow-up his right shoulder surgery.  He is doing well with that.  He is 6 weeks out from surgery.  Had partial repair of massive rotator cuff tear.  Patient also describes left leg and trochanteric pain.  There is a radicular component with some numbness and tingling.  Just a.  Over the past 2 weeks.  Is getting little bit worse.  Hard for him to sleep.  Localizes the pain on the left side of midline in the spine radiating to the trochanteric region and occasionally down the back of the leg.  He has tried Ultram Voltaren and one prednisone pack.  That helped him but the pain recurred after he stopped taking the medicine.  He has had hip radiographs which did not show any arthritis by report.  He is going to see his daughter next week on the [place].  He would like to feel his best for that trip.              ROS: All systems reviewed are negative as they relate to the chief complaint within the history of present illness.  Patient denies  fevers or chills.   Assessment & Plan: Visit Diagnoses:  1. Chronic midline low back pain with left-sided sciatica     Plan: Impression is doing well from right shoulder surgery.  Patient has new left leg trochanteric and buttock pain which seems to be radicular in nature.  He is not having any discrete groin pain so I think it is less likely that this is hip related.  He has excellent strength so abductor tendinitis is less likely.  I like to try him on some muscle relaxers and a second 6-day Medrol Dosepak with Tylenol 3 to have just in case those do not work.  4-week return  with plain radiographs of the lumbar spine at that time and possible MRI scanning and injections if needed.  Follow-Up Instructions: Return in about 4 weeks (around 06/20/2020).   Orders:  No orders of the defined types were placed in this encounter.  Meds ordered this encounter  Medications  . predniSONE (STERAPRED UNI-PAK 21 TAB) 5 MG (21) TBPK tablet    Sig: Take 6 day dosepak as directed    Dispense:  21 tablet    Refill:  0  . methocarbamol (ROBAXIN) 500 MG tablet    Sig: Take 1 tablet (500 mg total) by mouth every 8 (eight) hours as needed for muscle spasms.    Dispense:  30 tablet    Refill:  0      Procedures: No procedures performed   Clinical Data: No additional findings.  Objective: Vital Signs: There were no vitals taken for this visit.  Physical Exam:   Constitutional: Patient appears well-developed HEENT:  Head: Normocephalic Eyes:EOM are normal Neck: Normal range of motion Cardiovascular: Normal rate Pulmonary/chest: Effort normal Neurologic: Patient is alert Skin: Skin is warm Psychiatric: Patient has normal mood and affect  Ortho Exam: Ortho exam demonstrates improved right shoulder active and passive range of motion above 90 degrees of abduction with good internal/external rotation strength.  Incision is healing well.  Back is examined he has no nerve root tension signs no groin pain with internal extra rotation of the leg.  No masses lymphadenopathy or skin changes noted in that hip region.  Has excellent hip flexion strength bilaterally and excellent hip abduction strength bilaterally.  No trochanteric tenderness on either side to direct palpation  Specialty Comments:  No specialty comments available.  Imaging: No results found.   PMFS History: Patient Active Problem List   Diagnosis Date Noted  . ED (erectile dysfunction) 08/09/2012  . Hypercholesterolemia   . Chronic rhinitis   . Hyperlipidemia 02/29/2012   Past Medical History:    Diagnosis Date  . Anxiety   . Basal cell carcinoma   . Chronic rhinitis   . Clotting disorder (HCC)    right leg   . Depression   . Diverticulosis   . Elevated LFTs   . History of hypertension   . Hypercholesterolemia   . Sleep apnea     Family History  Problem Relation Age of Onset  . Diabetes Father   . Colon polyps Neg Hx   . Colon cancer Neg Hx   . Esophageal cancer Neg Hx   . Rectal cancer Neg Hx   . Prostate cancer Neg Hx     Past Surgical History:  Procedure Laterality Date  . COLONOSCOPY    . WRIST SURGERY  01/24/2003   Right- ORIF: Dr. Amedeo Plenty   Social History   Occupational History  . Occupation: Architectural technologist: [employer]  Tobacco Use  . Smoking status: Never Smoker  . Smokeless tobacco: Never Used  Vaping Use  . Vaping Use: Never used  Substance and Sexual Activity  . Alcohol use: No  . Drug use: No  . Sexual activity: Yes

## 2020-05-23 NOTE — Telephone Encounter (Signed)
submitted

## 2020-05-23 NOTE — Telephone Encounter (Signed)
Dr Marlou Sa saw patient today and would like T#3 1 po q 8 prn pain #30 tablets with no refills sent in for patient. Can you please submit? Thanks.

## 2020-06-10 ENCOUNTER — Encounter: Payer: Self-pay | Admitting: Rehabilitative and Restorative Service Providers"

## 2020-06-10 ENCOUNTER — Other Ambulatory Visit: Payer: Self-pay

## 2020-06-10 ENCOUNTER — Ambulatory Visit: Payer: Medicare PPO | Admitting: Rehabilitative and Restorative Service Providers"

## 2020-06-10 DIAGNOSIS — M25511 Pain in right shoulder: Secondary | ICD-10-CM | POA: Diagnosis not present

## 2020-06-10 DIAGNOSIS — M6281 Muscle weakness (generalized): Secondary | ICD-10-CM

## 2020-06-10 DIAGNOSIS — M25611 Stiffness of right shoulder, not elsewhere classified: Secondary | ICD-10-CM

## 2020-06-10 NOTE — Therapy (Signed)
[facility] [place], [place], [address] Phone: [phone]   Fax:  [phone]  Physical Therapy Treatment  Patient Details  Name: Jonathan Bridges MRN: [id number] Date of Birth: Nov 12, 1946 Referring Provider (PT): Dr. Marlou Sa   Encounter Date: 06/10/2020   PT End of Session - 06/10/20 1105    Visit Number 8    Number of Visits 18    Date for PT Re-Evaluation 07/01/20    Authorization Type [place] additional cert 3/97/6734 - 1/93/79    Authorization - Visit Number 2    Authorization - Number of Visits 6    Progress Note Due on Visit 16    PT Start Time 1100    PT Stop Time 1140    PT Time Calculation (min) 40 min    Activity Tolerance Patient tolerated treatment well    Behavior During Therapy [facility] for tasks assessed/performed           Past Medical History:  Diagnosis Date  . Anxiety   . Basal cell carcinoma   . Chronic rhinitis   . Clotting disorder (HCC)    right leg   . Depression   . Diverticulosis   . Elevated LFTs   . History of hypertension   . Hypercholesterolemia   . Sleep apnea     Past Surgical History:  Procedure Laterality Date  . COLONOSCOPY    . WRIST SURGERY  01/24/2003   Right- ORIF: Dr. Amedeo Plenty    There were no vitals filed for this visit.   Subjective Assessment - 06/10/20 1104    Subjective Pt. indicated no pain at this moment.  Pt. stated occasional soreness at times.    Limitations Lifting;House hold activities    Currently in Pain? No/denies    Pain Score 0-No pain    Pain Location Shoulder    Pain Onset More than a month ago                             [facility] Adult PT Treatment/Exercise - 06/10/20 0001      Shoulder Exercises: Standing   Other Standing Exercises standing 90 deg flexion lateral band pulls 3 points x 10 bilateral green band    Other Standing Exercises standing green band er c punch 3 x 10, standing abd c anterior  band resistnace green 20x      Shoulder Exercises: ROM/Strengthening   UBE (Upper Arm Bike) Lvl 2.5, 3 mins fwd/rev each    Lat Pull Other (comment)   3 x 10 25 lbs   Cybex Press Other (comment)   3 x 10 20 lbs   Cybex Row Other (comment)   3 x 10 25 lbs                      PT Long Term Goals - 06/10/20 1123      PT LONG TERM GOAL #1   Title Patient will demonstrate/report pain at worst less than or equal to 2/10 to facilitate minimal limitation in daily activity secondary to pain symptoms.    Status Achieved    Target Date 07/01/20      PT LONG TERM GOAL #2   Title Patient will demonstrate independent use of home exercise program to facilitate ability to maintain/progress functional gains from skilled physical therapy services.    Period Weeks    Status On-going  Target Date 07/01/20      PT LONG TERM GOAL #3   Title Pt. will demonstrate posture at PLOF s deviation/cues to improve scapulothoracic mobility for elevation attempts.    Status On-going    Target Date 07/01/20      PT LONG TERM GOAL #4   Title Pt. will demonstrate Rt GH AROM WFL s symptoms to facilitate ability to perform daily and recreational overhead reaching, dressing at PLOF.    Status On-going    Target Date 07/01/20      PT LONG TERM GOAL #5   Title Patient will demonstrate Rt UE MMT 5/5 throughout to facilitate usual lifting, carrying in functional activity to PLOF s limitation.    Period Weeks    Status On-going    Target Date 07/01/20                 Plan - 06/10/20 1121    Clinical Impression Statement Movement pattern active against gravity showing good progress towards equal to Lt.  Positional sense and proprioception control lacking in combination movement patterns today c fatigue noted.  Continued strengthening and proprioceptive improvements warrented.    Personal Factors and Comorbidities Comorbidity 3+    Comorbidities anxiety, history of clotting disorder Rt leg, HTN      Examination-Activity Limitations Reach Overhead;Carry;Lift;Dressing    Examination-Participation Restrictions Community Activity;Yard Work;Driving    Stability/Clinical Decision Making Stable/Uncomplicated    Rehab Potential Good    PT Frequency 2x / week    PT Duration 6 weeks    PT Treatment/Interventions ADLs/Self Care Home Management;Electrical Stimulation;Iontophoresis 4mg /ml Dexamethasone;Moist Heat;Ultrasound;Neuromuscular re-education;Balance training;Therapeutic exercise;Therapeutic activities;Functional mobility training;Patient/family education;Manual techniques;Dry needling;Passive range of motion;Taping;Spinal Manipulations;Joint Manipulations    PT Next Visit Plan Plan to continue to improve strength, coordination c focus on transition to HEP in next few weeks.    PT Home Exercise Plan [id number]    Consulted and Agree with Plan of Care Patient           Patient will benefit from skilled therapeutic intervention in order to improve the following deficits and impairments:  Decreased range of motion, Pain, Impaired UE functional use, Decreased activity tolerance, Hypomobility, Impaired flexibility, Decreased mobility, Decreased strength  Visit Diagnosis: Right shoulder pain, unspecified chronicity  Muscle weakness (generalized)  Stiffness of right shoulder, not elsewhere classified     Problem List Patient Active Problem List   Diagnosis Date Noted  . ED (erectile dysfunction) 08/09/2012  . Hypercholesterolemia   . Chronic rhinitis   . Hyperlipidemia 02/29/2012    Scot Jun, PT, DPT, OCS, ATC 06/10/20  11:34 AM    [facility] Physical Therapy [address] [place], [place], [address] Phone: [phone]   Fax:  [phone]  Name: KHALID LACKO MRN: [id number] Date of Birth: May 04, 1946

## 2020-06-12 ENCOUNTER — Ambulatory Visit: Payer: Medicare PPO | Admitting: Physical Therapy

## 2020-06-12 ENCOUNTER — Other Ambulatory Visit: Payer: Self-pay

## 2020-06-12 DIAGNOSIS — M6281 Muscle weakness (generalized): Secondary | ICD-10-CM | POA: Diagnosis not present

## 2020-06-12 DIAGNOSIS — M25611 Stiffness of right shoulder, not elsewhere classified: Secondary | ICD-10-CM

## 2020-06-12 DIAGNOSIS — M25511 Pain in right shoulder: Secondary | ICD-10-CM | POA: Diagnosis not present

## 2020-06-12 NOTE — Therapy (Signed)
[facility] [place], [place], [address] Phone: [phone]   Fax:  [phone]  Physical Therapy Treatment  Patient Details  Name: Jonathan Bridges MRN: [id number] Date of Birth: 11/24/1946 Referring Provider (PT): Dr. Marlou Sa   Encounter Date: 06/12/2020   PT End of Session - 06/12/20 0926    Visit Number 9    Number of Visits 18    Date for PT Re-Evaluation 07/01/20    Authorization Type [place] additional cert 2/56/3893 - 7/34/28    Authorization - Visit Number 3    Authorization - Number of Visits 6    Progress Note Due on Visit 16    PT Start Time 0850    PT Stop Time 0930    PT Time Calculation (min) 40 min    Activity Tolerance Patient tolerated treatment well    Behavior During Therapy [facility] for tasks assessed/performed           Past Medical History:  Diagnosis Date  . Anxiety   . Basal cell carcinoma   . Chronic rhinitis   . Clotting disorder (HCC)    right leg   . Depression   . Diverticulosis   . Elevated LFTs   . History of hypertension   . Hypercholesterolemia   . Sleep apnea     Past Surgical History:  Procedure Laterality Date  . COLONOSCOPY    . WRIST SURGERY  01/24/2003   Right- ORIF: Dr. Amedeo Plenty    There were no vitals filed for this visit.   Subjective Assessment - 06/12/20 0907    Subjective relays no shoulder pain upon arrival but his shoulder is sore.    Limitations Lifting;House hold activities    Pain Onset More than a month ago             [facility] Adult PT Treatment/Exercise - 06/12/20 0001      Shoulder Exercises: Standing   Other Standing Exercises standing at 90 deg of flexion and abd for ER and IR green band 2X10 ea    Other Standing Exercises standing D1 and D2 flexion green 2X10      Shoulder Exercises: ROM/Strengthening   UBE (Upper Arm Bike) L3 3 min fwd, 3 min retro    Lat Pull Limitations 3X10 25 lbs    Cybex Press Limitations 3X10 20  lbs    Cybex Row Limitations 3X10 25 lbs    Other ROM/Strengthening Exercises bicep curl on cable machine 20 lbs 3X10, triceps ext  rope on cable machine 25 lbs 3X10      Shoulder Exercises: Stretch   Internal Rotation Stretch Limitations 10 sec X 10 reps with strap behind back then 10 sec X 5 reps modified sleeper in standing                       PT Long Term Goals - 06/10/20 1123      PT LONG TERM GOAL #1   Title Patient will demonstrate/report pain at worst less than or equal to 2/10 to facilitate minimal limitation in daily activity secondary to pain symptoms.    Status Achieved    Target Date 07/01/20      PT LONG TERM GOAL #2   Title Patient will demonstrate independent use of home exercise program to facilitate ability to maintain/progress functional gains from skilled physical therapy services.    Period Weeks    Status On-going    Target  Date 07/01/20      PT LONG TERM GOAL #3   Title Pt. will demonstrate posture at PLOF s deviation/cues to improve scapulothoracic mobility for elevation attempts.    Status On-going    Target Date 07/01/20      PT LONG TERM GOAL #4   Title Pt. will demonstrate Rt GH AROM WFL s symptoms to facilitate ability to perform daily and recreational overhead reaching, dressing at PLOF.    Status On-going    Target Date 07/01/20      PT LONG TERM GOAL #5   Title Patient will demonstrate Rt UE MMT 5/5 throughout to facilitate usual lifting, carrying in functional activity to PLOF s limitation.    Period Weeks    Status On-going    Target Date 07/01/20                 Plan - 06/12/20 0931    Clinical Impression Statement Progressed strength and stabilization for Rt shoulder/scapula today with good toleance and without complaints. His ROM is doing well but lacking some IR ROM still. He will see MD next week so will update meausurements and goals for progress note next session.    Personal Factors and Comorbidities Comorbidity  3+    Comorbidities anxiety, history of clotting disorder Rt leg, HTN    Examination-Activity Limitations Reach Overhead;Carry;Lift;Dressing    Examination-Participation Restrictions Community Activity;Yard Work;Driving    Stability/Clinical Decision Making Stable/Uncomplicated    Rehab Potential Good    PT Frequency 2x / week    PT Duration 6 weeks    PT Treatment/Interventions ADLs/Self Care Home Management;Electrical Stimulation;Iontophoresis 4mg /ml Dexamethasone;Moist Heat;Ultrasound;Neuromuscular re-education;Balance training;Therapeutic exercise;Therapeutic activities;Functional mobility training;Patient/family education;Manual techniques;Dry needling;Passive range of motion;Taping;Spinal Manipulations;Joint Manipulations    PT Next Visit Plan Plan to continue to improve strength, coordination c focus on transition to HEP in next few weeks.    PT Home Exercise Plan [email or web address]    Consulted and Agree with Plan of Care Patient           Patient will benefit from skilled therapeutic intervention in order to improve the following deficits and impairments:  Decreased range of motion, Pain, Impaired UE functional use, Decreased activity tolerance, Hypomobility, Impaired flexibility, Decreased mobility, Decreased strength  Visit Diagnosis: Right shoulder pain, unspecified chronicity  Muscle weakness (generalized)  Stiffness of right shoulder, not elsewhere classified     Problem List Patient Active Problem List   Diagnosis Date Noted  . ED (erectile dysfunction) 08/09/2012  . Hypercholesterolemia   . Chronic rhinitis   . Hyperlipidemia 02/29/2012    Jonathan Bridges 06/12/2020, 9:33 AM  [facility] Physical Therapy [address] [place], [place], [address] Phone: [phone]   Fax:  [phone]  Name: Jonathan Bridges MRN: [id number] Date of Birth: Oct 07, 1946

## 2020-06-17 ENCOUNTER — Encounter: Payer: Self-pay | Admitting: Physical Therapy

## 2020-06-17 ENCOUNTER — Ambulatory Visit: Payer: Medicare PPO | Admitting: Physical Therapy

## 2020-06-17 ENCOUNTER — Other Ambulatory Visit: Payer: Self-pay

## 2020-06-17 DIAGNOSIS — M25611 Stiffness of right shoulder, not elsewhere classified: Secondary | ICD-10-CM

## 2020-06-17 DIAGNOSIS — M6281 Muscle weakness (generalized): Secondary | ICD-10-CM | POA: Diagnosis not present

## 2020-06-17 DIAGNOSIS — M25511 Pain in right shoulder: Secondary | ICD-10-CM

## 2020-06-17 NOTE — Patient Instructions (Signed)
Access Code: [email or web address] URL: [email or web address] Date: 06/17/2020 Prepared by: Elsie Ra  Exercises Standing Shoulder Row with Anchored Resistance - 1 x daily - 7 x weekly - 3 sets - 10 reps Shoulder Extension with Resistance - 1 x daily - 7 x weekly - 3 sets - 10 reps Seated Shoulder External Rotation with Resistance - 2 x daily - 6 x weekly - 3 sets - 10 reps Shoulder Abduction with Dumbbells - Palms Down - 2 x daily - 6 x weekly - 10 reps - 2 sets Standing Shoulder Flexion to 90 Degrees with Dumbbells - 2 x daily - 6 x weekly - 10 reps - 2 sets Prone Single Arm Shoulder Y - 2 x daily - 6 x weekly - 2 sets - 10 reps Prone Shoulder Horizontal Abduction - 2 x daily - 3-4 x weekly - 2 sets - 10 reps - 3 sec hold Prone Shoulder Extension - Single Arm with Dumbbell - 2 x daily - 6 x weekly - 2 sets - 10 reps

## 2020-06-17 NOTE — Therapy (Signed)
[facility] Physical Therapy [address] [place], [place], [address] Phone: [phone]   Fax:  [phone]  Physical Therapy Treatment/Progress note  Progress Note reporting period 05/06/20 to 06/17/20  See below for objective and subjective measurements relating to patients progress with PT.  Patient Details  Name: Jonathan Bridges MRN: [id number] Date of Birth: [birth year] Referring Provider (PT): Dr. Marlou Sa   Encounter Date: 06/17/2020   PT End of Session - 06/17/20 0938    Visit Number 10    Number of Visits 18    Date for PT Re-Evaluation 07/01/20    Authorization Type [place] additional cert 5/59/7416 - 3/84/53    Authorization - Visit Number 4    Authorization - Number of Visits 6    Progress Note Due on Visit 20    PT Start Time 0849    PT Stop Time 0930    PT Time Calculation (min) 41 min    Activity Tolerance Patient tolerated treatment well    Behavior During Therapy [facility] for tasks assessed/performed           Past Medical History:  Diagnosis Date  . Anxiety   . Basal cell carcinoma   . Chronic rhinitis   . Clotting disorder (HCC)    right leg   . Depression   . Diverticulosis   . Elevated LFTs   . History of hypertension   . Hypercholesterolemia   . Sleep apnea     Past Surgical History:  Procedure Laterality Date  . COLONOSCOPY    . WRIST SURGERY  01/24/2003   Right- ORIF: Dr. Amedeo Plenty    There were no vitals filed for this visit.   Subjective Assessment - 06/17/20 0937    Subjective shoulder pain and soreness about 4/10 today but his biggest complaint continues to be his hip, he will see MD tommorow.    Limitations Lifting;House hold activities    Pain Onset More than a month ago              [facility] PT Assessment - 06/17/20 0001      Assessment   Medical Diagnosis Rt shoulder pain s/p RTC repair    Referring Provider (PT) Dr. Marlou Sa    Onset Date/Surgical Date 03/24/20    Hand Dominance  Right    Next MD Visit 05/23/2020      AROM   Right Shoulder Flexion 170 Degrees    Right Shoulder ABduction 170 Degrees      Strength   Right Shoulder Flexion 5/5    Right Shoulder ABduction 4/5    Right Shoulder Internal Rotation 5/5    Right Shoulder External Rotation 4+/5                         [facility] Adult PT Treatment/Exercise - 06/17/20 0001      Shoulder Exercises: Prone   Other Prone Exercises Y,T,W,I X 10 reps ea on Rt      Shoulder Exercises: Standing   Flexion Strengthening;Both    Shoulder Flexion Weight (lbs) 2    Flexion Limitations 2X10    ABduction Both;Strengthening    Shoulder ABduction Weight (lbs) 1    ABduction Limitations 2X10    Other Standing Exercises standing at 90 deg of flexion and abd for ER and IR green band 2X10 ea    Other Standing Exercises body blade at flexion and then abd 30 sec bouts  Shoulder Exercises: ROM/Strengthening   UBE (Upper Arm Bike) L2 3 min fwd, 3 min retro                  PT Education - 06/17/20 0938    Education Details HEP progression    Person(s) Educated Patient    Methods Explanation;Demonstration;Verbal cues;Handout    Comprehension Verbalized understanding;Returned demonstration               PT Long Term Goals - 06/17/20 0941      PT LONG TERM GOAL #1   Title Patient will demonstrate/report pain at worst less than or equal to 2/10 to facilitate minimal limitation in daily activity secondary to pain symptoms.    Status On-going      PT LONG TERM GOAL #2   Title Patient will demonstrate independent use of home exercise program to facilitate ability to maintain/progress functional gains from skilled physical therapy services.    Baseline updated today    Period Weeks    Status On-going      PT LONG TERM GOAL #3   Title Pt. will demonstrate posture at PLOF s deviation/cues to improve scapulothoracic mobility for elevation attempts.    Baseline mild shrug sign still     Status On-going      PT LONG TERM GOAL #4   Title Pt. will demonstrate Rt GH AROM WFL s symptoms to facilitate ability to perform daily and recreational overhead reaching, dressing at PLOF.    Baseline now met    Status Achieved      PT LONG TERM GOAL #5   Title Patient will demonstrate Rt UE MMT 5/5 throughout to facilitate usual lifting, carrying in functional activity to PLOF s limitation.    Baseline 4/5 abd strength and 4+ ER strength, others 5    Period Weeks    Status On-going                 Plan - 06/17/20 0939    Clinical Impression Statement He has made good progress with his Rt shoulder ROM and strength, see updated measurements. He does stilll lack some mild IR/ER ROM, mild ER weakness, and mod abduction weakness. Overall is progressing as expected and PT is pleased with his progress but recommending continuing PT 2-4 more weeks to maximize his functional use of Rt arm and improve strength.    Personal Factors and Comorbidities Comorbidity 3+    Comorbidities anxiety, history of clotting disorder Rt leg, HTN    Examination-Activity Limitations Reach Overhead;Carry;Lift;Dressing    Examination-Participation Restrictions Community Activity;Yard Work;Driving    Stability/Clinical Decision Making Stable/Uncomplicated    Rehab Potential Good    PT Frequency 2x / week    PT Duration 6 weeks    PT Treatment/Interventions ADLs/Self Care Home Management;Electrical Stimulation;Iontophoresis 20m/ml Dexamethasone;Moist Heat;Ultrasound;Neuromuscular re-education;Balance training;Therapeutic exercise;Therapeutic activities;Functional mobility training;Patient/family education;Manual techniques;Dry needling;Passive range of motion;Taping;Spinal Manipulations;Joint Manipulations    PT Next Visit Plan Plan to continue to improve strength, coordination c focus on transition to HEP in next few weeks.    PT Home Exercise Plan [email or web address]    Consulted and Agree with Plan of Care Patient            Patient will benefit from skilled therapeutic intervention in order to improve the following deficits and impairments:  Decreased range of motion, Pain, Impaired UE functional use, Decreased activity tolerance, Hypomobility, Impaired flexibility, Decreased mobility, Decreased strength  Visit Diagnosis: Right shoulder pain, unspecified chronicity  Muscle weakness (generalized)  Stiffness of right shoulder, not elsewhere classified     Problem List Patient Active Problem List   Diagnosis Date Noted  . ED (erectile dysfunction) 08/09/2012  . Hypercholesterolemia   . Chronic rhinitis   . Hyperlipidemia 02/29/2012    Debbe Odea, PT,DPT 06/17/2020, 9:44 AM  [facility] Physical Therapy [address] [place], [place], [address] Phone: [phone]   Fax:  [phone]  Name: Jonathan Bridges MRN: [id number] Date of Birth: 06/21/1946

## 2020-06-18 ENCOUNTER — Telehealth: Payer: Self-pay

## 2020-06-18 ENCOUNTER — Ambulatory Visit (INDEPENDENT_AMBULATORY_CARE_PROVIDER_SITE_OTHER): Payer: Medicare PPO

## 2020-06-18 ENCOUNTER — Ambulatory Visit (INDEPENDENT_AMBULATORY_CARE_PROVIDER_SITE_OTHER): Payer: Medicare PPO | Admitting: Orthopedic Surgery

## 2020-06-18 ENCOUNTER — Ambulatory Visit: Payer: Self-pay

## 2020-06-18 DIAGNOSIS — M5442 Lumbago with sciatica, left side: Secondary | ICD-10-CM

## 2020-06-18 DIAGNOSIS — G8929 Other chronic pain: Secondary | ICD-10-CM | POA: Diagnosis not present

## 2020-06-18 NOTE — Telephone Encounter (Signed)
Per Dr Marlou Sa please submit ultram 1 po q 8 prn #[phone]

## 2020-06-19 ENCOUNTER — Telehealth: Payer: Self-pay | Admitting: Orthopedic Surgery

## 2020-06-19 ENCOUNTER — Other Ambulatory Visit: Payer: Self-pay | Admitting: Surgical

## 2020-06-19 ENCOUNTER — Telehealth: Payer: Self-pay

## 2020-06-19 ENCOUNTER — Telehealth: Payer: Self-pay | Admitting: Rehabilitative and Restorative Service Providers"

## 2020-06-19 ENCOUNTER — Encounter: Payer: Medicare PPO | Admitting: Rehabilitative and Restorative Service Providers"

## 2020-06-19 DIAGNOSIS — M7072 Other bursitis of hip, left hip: Secondary | ICD-10-CM

## 2020-06-19 MED ORDER — TRAMADOL HCL 50 MG PO TABS: 50.0000 mg | ORAL_TABLET | Freq: Three times a day (TID) | ORAL | 0 refills | Status: DC | PRN

## 2020-06-19 NOTE — Telephone Encounter (Signed)
Patient called asking for an update about pain medication. Please give patient a call about this matter. Patient phone number is [phone].

## 2020-06-19 NOTE — Telephone Encounter (Signed)
Can you please submit?

## 2020-06-19 NOTE — Telephone Encounter (Signed)
Patient called in saying he haven't received  His pain medication . Says he went to pharmacy twice and they have nothing in for him at the moment.

## 2020-06-19 NOTE — Telephone Encounter (Signed)
Called and left voice message regarding no show for today's appointment.  Next appointment scheduled for Monday July 12th.  Scot Jun, PT, DPT, OCS, ATC 06/19/20  10:06 AM

## 2020-06-19 NOTE — Telephone Encounter (Signed)
See other note. I have sent to [facility]. IC patient and advised still waiting for provider to submit.

## 2020-06-20 ENCOUNTER — Other Ambulatory Visit: Payer: Self-pay

## 2020-06-20 ENCOUNTER — Ambulatory Visit
Admission: RE | Admit: 2020-06-20 | Discharge: 2020-06-20 | Disposition: A | Discharge status: Home / Self Care | Payer: Medicare PPO | Source: Ambulatory Visit | Attending: Orthopedic Surgery | Admitting: Orthopedic Surgery

## 2020-06-20 DIAGNOSIS — M5442 Lumbago with sciatica, left side: Secondary | ICD-10-CM

## 2020-06-21 ENCOUNTER — Encounter: Payer: Self-pay | Admitting: Orthopedic Surgery

## 2020-06-21 NOTE — Progress Notes (Signed)
Office Visit Note   Patient: Jonathan Bridges           Date of Birth: May 06, 1946           MRN: [id number] Visit Date: 06/18/2020 Requested by: Forrest Moron, MD [phone] [place],  [place] [address] PCP: Forrest Moron, MD  Subjective: Chief Complaint  Patient presents with  . Right Shoulder - Pain  . Lower Back - Pain    HPI: Jonathan Bridges is a 74 year old patient here for follow-up of his right shoulder and low back.  He is about 10 weeks out from right shoulder and he has been doing well with that.  He has been going to physical therapy for his shoulder.  More pressing and concerning for follow-up at this time is his back.  He describes significant left-sided radiculopathy and significant back pain which is affecting his sleep as well as his activities of daily living.  Robaxin makes him feel somewhat somnolent.  He did try a Medrol Dosepak before his trip out to [place] to see his daughter.  Takes tramadol at night.  Also uses Tylenol and Voltaren.              ROS: All systems reviewed are negative as they relate to the chief complaint within the history of present illness.  Patient denies  fevers or chills.   Assessment & Plan: Visit Diagnoses:  1. Chronic midline low back pain with left-sided sciatica     Plan: Impression is fairly significant and severe left-sided low back pain with left-sided sciatica with positive nerve root tension signs but no discrete weakness.  He has failed conservative management.  He is having borderline intractable pain.  Needs MRI lumbar spine to evaluate left-sided radiculopathy and refill tramadol today.  See him back after the MRI scan.  He is doing well with the shoulder and is pleased with that result.  Follow-Up Instructions: Return for after MRI.   Orders:  Orders Placed This Encounter  Procedures  . XR Lumbar Spine 2-3 Views  . MR Lumbar Spine w/o contrast   No orders of the defined types were placed in this  encounter.     Procedures: No procedures performed   Clinical Data: No additional findings.  Objective: Vital Signs: There were no vitals taken for this visit.  Physical Exam:   Constitutional: Patient appears well-developed HEENT:  Head: Normocephalic Eyes:EOM are normal Neck: Normal range of motion Cardiovascular: Normal rate Pulmonary/chest: Effort normal Neurologic: Patient is alert Skin: Skin is warm Psychiatric: Patient has normal mood and affect    Ortho Exam: Ortho exam demonstrates good cervical spine range of motion.  His shoulder looks good with improved strength as well as improved active forward flexion and abduction.  Lumbar spine exam demonstrates positive nerve root tension signs on the left negative on the right.  He does have some paresthesias in the L5 distribution left versus right.  Mild pain with forward and lateral bending.  No groin pain with internal/external rotation of either leg.  No trochanteric tenderness.  No other masses lymphadenopathy or skin changes noted in that low back region.  Specialty Comments:  No specialty comments available.  Imaging: No results found.   PMFS History: Patient Active Problem List   Diagnosis Date Noted  . ED (erectile dysfunction) 08/09/2012  . Hypercholesterolemia   . Chronic rhinitis   . Hyperlipidemia 02/29/2012   Past Medical History:  Diagnosis Date  .  Anxiety   . Basal cell carcinoma   . Chronic rhinitis   . Clotting disorder (HCC)    right leg   . Depression   . Diverticulosis   . Elevated LFTs   . History of hypertension   . Hypercholesterolemia   . Sleep apnea     Family History  Problem Relation Age of Onset  . Diabetes Father   . Colon polyps Neg Hx   . Colon cancer Neg Hx   . Esophageal cancer Neg Hx   . Rectal cancer Neg Hx   . Prostate cancer Neg Hx     Past Surgical History:  Procedure Laterality Date  . COLONOSCOPY    . WRIST SURGERY  01/24/2003   Right- ORIF: Dr. Amedeo Plenty    Social History   Occupational History  . Occupation: Architectural technologist: [employer]  Tobacco Use  . Smoking status: Never Smoker  . Smokeless tobacco: Never Used  Vaping Use  . Vaping Use: Never used  Substance and Sexual Activity  . Alcohol use: No  . Drug use: No  . Sexual activity: Yes

## 2020-06-23 ENCOUNTER — Ambulatory Visit: Payer: Medicare PPO | Admitting: Rehabilitative and Restorative Service Providers"

## 2020-06-23 ENCOUNTER — Encounter: Payer: Self-pay | Admitting: Rehabilitative and Restorative Service Providers"

## 2020-06-23 ENCOUNTER — Telehealth: Payer: Self-pay | Admitting: Orthopedic Surgery

## 2020-06-23 ENCOUNTER — Other Ambulatory Visit: Payer: Self-pay

## 2020-06-23 DIAGNOSIS — M25611 Stiffness of right shoulder, not elsewhere classified: Secondary | ICD-10-CM | POA: Diagnosis not present

## 2020-06-23 DIAGNOSIS — M6281 Muscle weakness (generalized): Secondary | ICD-10-CM | POA: Diagnosis not present

## 2020-06-23 DIAGNOSIS — M25511 Pain in right shoulder: Secondary | ICD-10-CM | POA: Diagnosis not present

## 2020-06-23 NOTE — Therapy (Addendum)
[place], [place], [address] Phone: [phone]   Fax:  [phone]  Physical Therapy Treatment  Patient Details  Name: Jonathan Bridges MRN: [id number] Date of Birth: 1946-05-09 Referring Provider (PT): Dr. Marlou Sa   Encounter Date: 06/23/2020   PT End of Session - 06/23/20 0850    Visit Number 11    Number of Visits 18    Date for PT Re-Evaluation 07/01/20    Authorization Type [place] additional cert 5/63/8937 - 3/42/87  (corrected visit number below today)    Authorization - Visit Number 5    Authorization - Number of Visits 12    Progress Note Due on Visit 20    PT Start Time 0845    PT Stop Time 0924    PT Time Calculation (min) 39 min    Activity Tolerance Patient tolerated treatment well    Behavior During Therapy [facility] for tasks assessed/performed           Past Medical History:  Diagnosis Date  . Anxiety   . Basal cell carcinoma   . Chronic rhinitis   . Clotting disorder (HCC)    right leg   . Depression   . Diverticulosis   . Elevated LFTs   . History of hypertension   . Hypercholesterolemia   . Sleep apnea     Past Surgical History:  Procedure Laterality Date  . COLONOSCOPY    . WRIST SURGERY  01/24/2003   Right- ORIF: Dr. Amedeo Plenty    There were no vitals filed for this visit.   Subjective Assessment - 06/23/20 0849    Subjective Pt. indicated still dealing with hip complaints.  Shoulder not really hurting, just tired and sore at times.    Limitations Lifting;House hold activities    Currently in Pain? Yes    Pain Score 1     Pain Location Shoulder    Pain Orientation Right   Pain Descriptors / Indicators Sore    Pain Type Surgical pain;Chronic pain    Pain Onset More than a month ago    Aggravating Factors  after exercise at times    Pain Relieving Factors Icing/rest                             [facility] Adult PT Treatment/Exercise -  06/23/20 0001      Shoulder Exercises: Standing   External Rotation Strengthening;Right;Other (comment)   3 x 10 green band   Theraband Level (Shoulder External Rotation) Level 3 (Green)    Other Standing Exercises standing tband rows green 3 x 15, standing 90 deg flexion ball circles cw, ccw 30 x 2 each way    Other Standing Exercises body blade 30 sec x 2 in arm at side neutral flex, er/ir       Shoulder Exercises: ROM/Strengthening   UBE (Upper Arm Bike) Lvl 2.5 3 mins fwd/back each way      Shoulder Exercises: Stretch   Other Shoulder Stretches standing ER 90/90 stretch in doorway 30 sec x 5 Rt UE                       PT Long Term Goals - 06/17/20 0941      PT LONG TERM GOAL #1   Title Patient will demonstrate/report pain at worst less than or equal to 2/10 to facilitate minimal limitation in daily activity  secondary to pain symptoms.    Status On-going      PT LONG TERM GOAL #2   Title Patient will demonstrate independent use of home exercise program to facilitate ability to maintain/progress functional gains from skilled physical therapy services.    Baseline updated today    Period Weeks    Status On-going      PT LONG TERM GOAL #3   Title Pt. will demonstrate posture at PLOF s deviation/cues to improve scapulothoracic mobility for elevation attempts.    Baseline mild shrug sign still    Status On-going      PT LONG TERM GOAL #4   Title Pt. will demonstrate Rt GH AROM WFL s symptoms to facilitate ability to perform daily and recreational overhead reaching, dressing at PLOF.    Baseline now met    Status Achieved      PT LONG TERM GOAL #5   Title Patient will demonstrate Rt UE MMT 5/5 throughout to facilitate usual lifting, carrying in functional activity to PLOF s limitation.    Baseline 4/5 abd strength and 4+ ER strength, others 5    Period Weeks    Status On-going                 Plan - 06/23/20 8381    Clinical Impression Statement Pt.  continued to show improvement and good knowledge overall of HEP and Rt shoulder intervention at this time.  Occasional cues required for technique adjustmnet as well as cues for adjustment of frequency/resistance to avoid excessive soreness.  Recommend continued periscapular strengthening and improve mechanics for daily functional lifting/reaching.    Personal Factors and Comorbidities Comorbidity 3+    Comorbidities anxiety, history of clotting disorder Rt leg, HTN    Examination-Activity Limitations Reach Overhead;Carry;Lift;Dressing    Examination-Participation Restrictions Community Activity;Yard Work;Driving    Stability/Clinical Decision Making Stable/Uncomplicated    Rehab Potential Good    PT Frequency 2x / week    PT Duration 6 weeks    PT Treatment/Interventions ADLs/Self Care Home Management;Electrical Stimulation;Iontophoresis 26m/ml Dexamethasone;Moist Heat;Ultrasound;Neuromuscular re-education;Balance training;Therapeutic exercise;Therapeutic activities;Functional mobility training;Patient/family education;Manual techniques;Dry needling;Passive range of motion;Taping;Spinal Manipulations;Joint Manipulations    PT Next Visit Plan Continue to improve scapular rhythm, HEP transition in next week or 2    PT Home Exercise Plan [id number]    Consulted and Agree with Plan of Care Patient           Patient will benefit from skilled therapeutic intervention in order to improve the following deficits and impairments:  Decreased range of motion, Pain, Impaired UE functional use, Decreased activity tolerance, Hypomobility, Impaired flexibility, Decreased mobility, Decreased strength  Visit Diagnosis: Right shoulder pain, unspecified chronicity  Muscle weakness (generalized)  Stiffness of right shoulder, not elsewhere classified     Problem List Patient Active Problem List   Diagnosis Date Noted  . ED (erectile dysfunction) 08/09/2012  . Hypercholesterolemia   . Chronic rhinitis     . Hyperlipidemia 02/29/2012    MScot Jun PT, DPT, OCS, ATC 06/25/20  8:54 AM       [facility] [address] [address]Phone: [phone]  Fax:  [phone] Name: Jonathan KISSNERMRN: [id number] of Birth: 7Jan 02, 1947

## 2020-06-25 ENCOUNTER — Encounter: Payer: Self-pay | Admitting: Rehabilitative and Restorative Service Providers"

## 2020-06-25 ENCOUNTER — Ambulatory Visit: Payer: Medicare PPO | Admitting: Orthopedic Surgery

## 2020-06-25 ENCOUNTER — Other Ambulatory Visit: Payer: Self-pay

## 2020-06-25 ENCOUNTER — Ambulatory Visit: Payer: Medicare PPO | Admitting: Rehabilitative and Restorative Service Providers"

## 2020-06-25 DIAGNOSIS — M25611 Stiffness of right shoulder, not elsewhere classified: Secondary | ICD-10-CM | POA: Diagnosis not present

## 2020-06-25 DIAGNOSIS — M6281 Muscle weakness (generalized): Secondary | ICD-10-CM | POA: Diagnosis not present

## 2020-06-25 DIAGNOSIS — M25511 Pain in right shoulder: Secondary | ICD-10-CM

## 2020-06-25 NOTE — Therapy (Signed)
[place], [place], [address] Phone: [phone]   Fax:  [phone]  Physical Therapy Treatment  Patient Details  Name: Jonathan Bridges MRN: [id number] Date of Birth: 12-Dec-1946 Referring Provider (PT): Dr. Marlou Sa   Encounter Date: 06/25/2020   PT End of Session - 06/25/20 0848    Visit Number 12    Number of Visits 18    Date for PT Re-Evaluation 07/01/20    Authorization Type [place] additional cert 7/67/3419 - 3/79/02  (corrected visit number below today)    Authorization - Visit Number 6    Authorization - Number of Visits 12    Progress Note Due on Visit 20    PT Start Time 0844    PT Stop Time 0923    PT Time Calculation (min) 39 min    Activity Tolerance Patient tolerated treatment well    Behavior During Therapy [facility] for tasks assessed/performed           Past Medical History:  Diagnosis Date  . Anxiety   . Basal cell carcinoma   . Chronic rhinitis   . Clotting disorder (HCC)    right leg   . Depression   . Diverticulosis   . Elevated LFTs   . History of hypertension   . Hypercholesterolemia   . Sleep apnea     Past Surgical History:  Procedure Laterality Date  . COLONOSCOPY    . WRIST SURGERY  01/24/2003   Right- ORIF: Dr. Amedeo Plenty    There were no vitals filed for this visit.   Subjective Assessment - 06/25/20 0846    Subjective Pt. indicated a little sore in shoulder after last visit but not having pain.    Limitations Lifting;House hold activities    Currently in Pain? No/denies    Pain Score 0-No pain    Pain Location Shoulder    Pain Orientation Right    Pain Descriptors / Indicators Sore    Pain Type Surgical pain;Chronic pain    Pain Onset More than a month ago    Pain Frequency Occasional    Aggravating Factors  sore after exercise at times    Pain Relieving Factors rest    Effect of Pain on Daily Activities heavy lift                              [facility] Adult PT Treatment/Exercise - 06/25/20 0001      Shoulder Exercises: Prone   Other Prone Exercises prone y, t 3 x 10 each 1 lb      Shoulder Exercises: Standing   External Rotation Strengthening;Right;Other (comment)    Theraband Level (Shoulder External Rotation) Level 3 (Green)   3 x 15    Other Standing Exercises 90 deg flexion soccer ball circles cw, ccw 30 x 2 each way    Other Standing Exercises SA foam flexion on wall red band ER hold 20x      Shoulder Exercises: ROM/Strengthening   UBE (Upper Arm Bike) lvl 3 3 mins fwd/rev each    Lat Pull Other (comment)   3 x 15 25 lbs   Cybex Row Other (comment)   3 x 15 25 lbs     Shoulder Exercises: Stretch   Other Shoulder Stretches standing ER 90/90 stretch in doorway 30 sec x 5 Rt UE  PT Long Term Goals - 06/17/20 0941      PT LONG TERM GOAL #1   Title Patient will demonstrate/report pain at worst less than or equal to 2/10 to facilitate minimal limitation in daily activity secondary to pain symptoms.    Status On-going      PT LONG TERM GOAL #2   Title Patient will demonstrate independent use of home exercise program to facilitate ability to maintain/progress functional gains from skilled physical therapy services.    Baseline updated today    Period Weeks    Status On-going      PT LONG TERM GOAL #3   Title Pt. will demonstrate posture at PLOF s deviation/cues to improve scapulothoracic mobility for elevation attempts.    Baseline mild shrug sign still    Status On-going      PT LONG TERM GOAL #4   Title Pt. will demonstrate Rt GH AROM WFL s symptoms to facilitate ability to perform daily and recreational overhead reaching, dressing at PLOF.    Baseline now met    Status Achieved      PT LONG TERM GOAL #5   Title Patient will demonstrate Rt UE MMT 5/5 throughout to facilitate usual lifting, carrying in functional activity to PLOF s limitation.     Baseline 4/5 abd strength and 4+ ER strength, others 5    Period Weeks    Status On-going                 Plan - 06/25/20 0909    Clinical Impression Statement Cues required for intervention improvements with techniques (positioning).  Fatigue noted but no pain reported during intervention.  Progressing well towrads goal of d/c c HEP.    Personal Factors and Comorbidities Comorbidity 3+    Comorbidities anxiety, history of clotting disorder Rt leg, HTN    Examination-Activity Limitations Reach Overhead;Carry;Lift;Dressing    Examination-Participation Restrictions Community Activity;Yard Work;Driving    Stability/Clinical Decision Making Stable/Uncomplicated    Rehab Potential Good    PT Frequency 2x / week    PT Duration 6 weeks    PT Treatment/Interventions ADLs/Self Care Home Management;Electrical Stimulation;Iontophoresis 10m/ml Dexamethasone;Moist Heat;Ultrasound;Neuromuscular re-education;Balance training;Therapeutic exercise;Therapeutic activities;Functional mobility training;Patient/family education;Manual techniques;Dry needling;Passive range of motion;Taping;Spinal Manipulations;Joint Manipulations    PT Next Visit Plan Possible D/C to HEP in next visit or two    PT Home Exercise Plan [id number]    Consulted and Agree with Plan of Care Patient           Patient will benefit from skilled therapeutic intervention in order to improve the following deficits and impairments:  Decreased range of motion, Pain, Impaired UE functional use, Decreased activity tolerance, Hypomobility, Impaired flexibility, Decreased mobility, Decreased strength  Visit Diagnosis: Right shoulder pain, unspecified chronicity  Muscle weakness (generalized)  Stiffness of right shoulder, not elsewhere classified     Problem List Patient Active Problem List   Diagnosis Date Noted  . ED (erectile dysfunction) 08/09/2012  . Hypercholesterolemia   . Chronic rhinitis   . Hyperlipidemia  02/29/2012    MScot Jun PT, DPT, OCS, ATC 06/25/20  9:24 AM    [facility] [address] [address]Phone: [phone]  Fax:  [phone] Name: Jonathan FREDERICKSMRN: [id number] of Birth: 7Feb 06, 1947

## 2020-06-26 ENCOUNTER — Ambulatory Visit: Payer: Medicare PPO | Admitting: Orthopedic Surgery

## 2020-06-26 ENCOUNTER — Encounter: Payer: Self-pay | Admitting: Orthopedic Surgery

## 2020-06-26 ENCOUNTER — Other Ambulatory Visit: Payer: Self-pay | Admitting: Orthopedic Surgery

## 2020-06-26 DIAGNOSIS — G8929 Other chronic pain: Secondary | ICD-10-CM | POA: Diagnosis not present

## 2020-06-26 DIAGNOSIS — M7072 Other bursitis of hip, left hip: Secondary | ICD-10-CM

## 2020-06-26 DIAGNOSIS — M5442 Lumbago with sciatica, left side: Secondary | ICD-10-CM | POA: Diagnosis not present

## 2020-06-26 MED ORDER — DICLOFENAC SODIUM 75 MG PO TBEC: 75.0000 mg | DELAYED_RELEASE_TABLET | Freq: Two times a day (BID) | ORAL | 0 refills | Status: DC

## 2020-06-26 MED ORDER — TRAMADOL HCL 50 MG PO TABS: 50.0000 mg | ORAL_TABLET | Freq: Three times a day (TID) | ORAL | 0 refills | Status: AC | PRN

## 2020-06-26 NOTE — Telephone Encounter (Signed)
Pls advise.  

## 2020-06-26 NOTE — Progress Notes (Signed)
Office Visit Note   Patient: Jonathan Bridges           Date of Birth: 07-Feb-1946           MRN: [id number] Visit Date: 06/26/2020 Requested by: Forrest Moron, MD [phone] [place],  [place] [address] PCP: Forrest Moron, MD  Subjective: Chief Complaint  Patient presents with   Lower Back - Follow-up    HPI: Jonathan Bridges is a 74 year old patient is doing well from his right shoulder surgery.  He does have low back pain and he is here to review his MRI scan today.  That MRI scan does show definite disc pathology on the left-hand side at L5-S1.  Notably his wife states that he has been walking more with a slap foot gait over the past 2 weeks.  Tramadol and Voltaren is helping.  Denies any bowel or bladder symptoms.              ROS: All systems reviewed are negative as they relate to the chief complaint within the history of present illness.  Patient denies  fevers or chills.   Assessment & Plan: Visit Diagnoses:  1. Chronic midline low back pain with left-sided sciatica     Plan: Impression is midline low back pain with left-sided sciatica now with new ankle dorsiflexion weakness about 3- out of 5.  I think this is an operative problem.  Would like to get him seen by spine surgeon in the near future.  Discussed with him that on the degree of difficulty for spine surgery this is relatively low.  He is going to see a neurosurgeon tomorrow for evaluation and management of this problem.  This is not really a problem that injections can predictably help.  Follow-up with [place] as needed.  Follow-Up Instructions: Return if symptoms worsen or fail to improve.   Orders:  No orders of the defined types were placed in this encounter.  No orders of the defined types were placed in this encounter.     Procedures: No procedures performed   Clinical Data: No additional findings.  Objective: Vital Signs: There were no vitals taken for this visit.  Physical Exam:    Constitutional: Patient appears well-developed HEENT:  Head: Normocephalic Eyes:EOM are normal Neck: Normal range of motion Cardiovascular: Normal rate Pulmonary/chest: Effort normal Neurologic: Patient is alert Skin: Skin is warm Psychiatric: Patient has normal mood and affect    Ortho Exam: Ortho exam demonstrates 3 out of 5 ankle dorsiflexion weakness which is a new finding compared to exam from over a week ago.  Pedal pulses palpable.  Paresthesias have improved.  His great toe does have numbness on the left-hand side compared to the right.  No nerve root tension signs today.  Does have some muscle atrophy on the left calf compared to the right calf which is also a new finding.  Specialty Comments:  No specialty comments available.  Imaging: No results found.   PMFS History: Patient Active Problem List   Diagnosis Date Noted   ED (erectile dysfunction) 08/09/2012   Hypercholesterolemia    Chronic rhinitis    Hyperlipidemia 02/29/2012   Past Medical History:  Diagnosis Date   Anxiety    Basal cell carcinoma    Chronic rhinitis    Clotting disorder (HCC)    right leg    Depression    Diverticulosis    Elevated LFTs    History of hypertension  Hypercholesterolemia    Sleep apnea     Family History  Problem Relation Age of Onset   Diabetes Father    Colon polyps Neg Hx    Colon cancer Neg Hx    Esophageal cancer Neg Hx    Rectal cancer Neg Hx    Prostate cancer Neg Hx     Past Surgical History:  Procedure Laterality Date   COLONOSCOPY     WRIST SURGERY  01/24/2003   Right- ORIF: Dr. Amedeo Plenty   Social History   Occupational History   Occupation: realtor    Employer: [employer]  Tobacco Use   Smoking status: Never Smoker   Smokeless tobacco: Never Used  Vaping Use   Vaping Use: Never used  Substance and Sexual Activity   Alcohol use: No   Drug use: No   Sexual activity: Yes

## 2020-07-01 ENCOUNTER — Other Ambulatory Visit: Payer: Self-pay

## 2020-07-01 ENCOUNTER — Ambulatory Visit: Payer: Medicare PPO | Admitting: Physical Therapy

## 2020-07-01 DIAGNOSIS — M6281 Muscle weakness (generalized): Secondary | ICD-10-CM

## 2020-07-01 DIAGNOSIS — M25511 Pain in right shoulder: Secondary | ICD-10-CM | POA: Diagnosis not present

## 2020-07-01 DIAGNOSIS — M25611 Stiffness of right shoulder, not elsewhere classified: Secondary | ICD-10-CM | POA: Diagnosis not present

## 2020-07-01 NOTE — Therapy (Signed)
[facility] Physical Therapy [address] [place], [place], [address] Phone: [phone]   Fax:  [phone]  Physical Therapy Treatment/Discharge PHYSICAL THERAPY DISCHARGE SUMMARY  Visits from Start of Care: 13  Current functional level related to goals / functional outcomes: 90% back to baseline   Remaining deficits: Mild weakness in abd and ER for Rt shoulder, but can progress this with HEP   Education / Equipment: HEP review Plan: Patient agrees to discharge.  Patient goals were met. Patient is being discharged due to being pleased with the current functional level.  ?????        Patient Details  Name: Jonathan Bridges MRN: [id number] Date of Birth: 1946-02-09 Referring Provider (PT): Dr. Marlou Sa   Encounter Date: 07/01/2020   PT End of Session - 07/01/20 0903    Visit Number 13    Number of Visits 18    Date for PT Re-Evaluation 07/01/20    Authorization Type [place] additional cert 9/35/7017 - 7/93/90  (corrected visit number below today)    Authorization - Visit Number 7    Authorization - Number of Visits 12    Progress Note Due on Visit 20    PT Start Time 0845    PT Stop Time 0923    PT Time Calculation (min) 38 min    Activity Tolerance Patient tolerated treatment well    Behavior During Therapy [facility] for tasks assessed/performed           Past Medical History:  Diagnosis Date  . Anxiety   . Basal cell carcinoma   . Chronic rhinitis   . Clotting disorder (HCC)    right leg   . Depression   . Diverticulosis   . Elevated LFTs   . History of hypertension   . Hypercholesterolemia   . Sleep apnea     Past Surgical History:  Procedure Laterality Date  . COLONOSCOPY    . WRIST SURGERY  01/24/2003   Right- ORIF: Dr. Amedeo Plenty    There were no vitals filed for this visit.   Subjective Assessment - 07/01/20 0900    Subjective relays he will need back surgery and this scheduled for next week. This  will be his last PT visit as he feels ready for discharge and will need to focus on recovery for his back. He reports his shoulder feels 90% back to normal.    Limitations Lifting;House hold activities    Currently in Pain? No/denies   soreness more than pain   Pain Onset More than a month ago              [facility] PT Assessment - 07/01/20 0001      Assessment   Medical Diagnosis Rt shoulder pain s/p RTC repair    Referring Provider (PT) Dr. Marlou Sa    Onset Date/Surgical Date 03/24/20      AROM   Overall AROM Comments Rt shoulder AROM [facility]      Strength   Right Shoulder Flexion 5/5    Right Shoulder ABduction 4+/5    Right Shoulder Internal Rotation 5/5    Right Shoulder External Rotation 4+/5                         [facility] Adult PT Treatment/Exercise - 07/01/20 0001      Shoulder Exercises: Prone   Other Prone Exercises prone y, t 3 x 15 each 1 lb  Shoulder Exercises: Standing   External Rotation Strengthening;Right;Other (comment)    Theraband Level (Shoulder External Rotation) Level 3 (Green)    External Rotation Limitations 3X15    Internal Rotation Strengthening    Theraband Level (Shoulder Internal Rotation) Level 3 (Green)    Internal Rotation Limitations 3X15    Diagonals Limitations D1 and D2 flexion X 15 ea on Lt side red band      Shoulder Exercises: ROM/Strengthening   UBE (Upper Arm Bike) lvl 3 3 mins fwd/rev each    Lat Pull Limitations 25 lbs 3X15 reps    Cybex Row Limitations 25 lbs 3X15 reps      Shoulder Exercises: Stretch   Other Shoulder Stretches standing ER 90/90 stretch in doorway 30 sec x 5 Rt UE                       PT Long Term Goals - 07/01/20 0906      PT LONG TERM GOAL #1   Title Patient will demonstrate/report pain at worst less than or equal to 2/10 to facilitate minimal limitation in daily activity secondary to pain symptoms.    Status Achieved      PT LONG TERM GOAL #2   Title Patient will demonstrate  independent use of home exercise program to facilitate ability to maintain/progress functional gains from skilled physical therapy services.    Baseline updated today    Period Weeks    Status Achieved      PT LONG TERM GOAL #3   Title Pt. will demonstrate posture at PLOF s deviation/cues to improve scapulothoracic mobility for elevation attempts.    Baseline mild shrug sign still    Status Achieved      PT LONG TERM GOAL #4   Title Pt. will demonstrate Rt GH AROM WFL s symptoms to facilitate ability to perform daily and recreational overhead reaching, dressing at PLOF.    Baseline now met    Status Achieved      PT LONG TERM GOAL #5   Title Patient will demonstrate Rt UE MMT 5/5 throughout to facilitate usual lifting, carrying in functional activity to PLOF s limitation.    Baseline 4+/5 abd strength and 4+ ER strength, others 5    Period Weeks    Status Partially Met                 Plan - 07/01/20 0913    Clinical Impression Statement He has done well with PT and has met all goals except has mild strength deficits for abduction and ER. He will be having lumbar surgery next week so will discharge today for his shoulder and he feels confident he can progress his strength on his own with HEP. He had no further questions or concerns.    Personal Factors and Comorbidities Comorbidity 3+    Comorbidities anxiety, history of clotting disorder Rt leg, HTN    Examination-Activity Limitations Reach Overhead;Carry;Lift;Dressing    Examination-Participation Restrictions Community Activity;Yard Work;Driving    Stability/Clinical Decision Making Stable/Uncomplicated    Rehab Potential Good    PT Frequency 2x / week    PT Duration 6 weeks    PT Treatment/Interventions ADLs/Self Care Home Management;Electrical Stimulation;Iontophoresis 4m/ml Dexamethasone;Moist Heat;Ultrasound;Neuromuscular re-education;Balance training;Therapeutic exercise;Therapeutic activities;Functional mobility  training;Patient/family education;Manual techniques;Dry needling;Passive range of motion;Taping;Spinal Manipulations;Joint Manipulations    PT Next Visit Plan DC today    PT Home Exercise Plan [email or web address]    Consulted and Agree with Plan of Care Patient  Patient will benefit from skilled therapeutic intervention in order to improve the following deficits and impairments:  Decreased range of motion, Pain, Impaired UE functional use, Decreased activity tolerance, Hypomobility, Impaired flexibility, Decreased mobility, Decreased strength  Visit Diagnosis: Right shoulder pain, unspecified chronicity  Muscle weakness (generalized)  Stiffness of right shoulder, not elsewhere classified     Problem List Patient Active Problem List   Diagnosis Date Noted  . ED (erectile dysfunction) 08/09/2012  . Hypercholesterolemia   . Chronic rhinitis   . Hyperlipidemia 02/29/2012    [name] 07/01/2020, 9:15 AM  [facility] Therapy [address] [place], [place], [address] Phone: [phone]   Fax:  [phone]  Name: Jonathan Bridges MRN: [id number] Date of Birth: Apr 24, 1946

## 2020-07-02 ENCOUNTER — Encounter: Payer: Self-pay | Admitting: Orthopedic Surgery

## 2020-07-08 ENCOUNTER — Ambulatory Visit: Payer: Medicare PPO | Admitting: Family Medicine

## 2020-08-19 ENCOUNTER — Encounter: Payer: Medicare PPO | Admitting: Registered Nurse

## 2020-09-04 ENCOUNTER — Other Ambulatory Visit (HOSPITAL_COMMUNITY): Payer: Self-pay | Admitting: Neurological Surgery

## 2020-09-04 DIAGNOSIS — R609 Edema, unspecified: Secondary | ICD-10-CM

## 2020-09-05 ENCOUNTER — Other Ambulatory Visit: Payer: Self-pay

## 2020-09-05 ENCOUNTER — Ambulatory Visit (HOSPITAL_COMMUNITY)
Admission: RE | Admit: 2020-09-05 | Discharge: 2020-09-05 | Disposition: A | Discharge status: Home / Self Care | Payer: Medicare PPO | Source: Ambulatory Visit | Attending: Neurological Surgery | Admitting: Neurological Surgery

## 2020-09-05 DIAGNOSIS — R609 Edema, unspecified: Secondary | ICD-10-CM

## 2020-09-15 ENCOUNTER — Other Ambulatory Visit: Payer: Self-pay

## 2020-09-15 ENCOUNTER — Ambulatory Visit (INDEPENDENT_AMBULATORY_CARE_PROVIDER_SITE_OTHER): Payer: Medicare PPO | Admitting: Physical Therapy

## 2020-09-15 ENCOUNTER — Encounter: Payer: Self-pay | Admitting: Physical Therapy

## 2020-09-15 DIAGNOSIS — R2689 Other abnormalities of gait and mobility: Secondary | ICD-10-CM

## 2020-09-15 DIAGNOSIS — M6281 Muscle weakness (generalized): Secondary | ICD-10-CM

## 2020-09-15 DIAGNOSIS — M5442 Lumbago with sciatica, left side: Secondary | ICD-10-CM | POA: Diagnosis not present

## 2020-09-15 DIAGNOSIS — R600 Localized edema: Secondary | ICD-10-CM | POA: Diagnosis not present

## 2020-09-15 NOTE — Addendum Note (Signed)
Addended by: Debbe Odea on: 09/15/2020 04:37 PM   Modules accepted: Orders

## 2020-09-15 NOTE — Patient Instructions (Signed)
Access Code: [email or web address] URL: [email or web address] Date: 09/15/2020 Prepared by: Elsie Ra  Exercises Standing Lumbar Extension at [place] - 2 x daily - 6 x weekly - 2-33 sets - 10 reps - 5 hold Supine Lower Trunk Rotation - 2 x daily - 6 x weekly - 10 reps - 1 sets - 5 sec hold Supine Figure 4 Piriformis Stretch - 2 x daily - 6 x weekly - 3 reps - 1 sets - 30 hold Supine Hamstring Stretch with Strap - 2 x daily - 6 x weekly - 3 reps - 1 sets - 30 hold Supine Bridge - 2 x daily - 6 x weekly - 10 reps - 1-2 sets - 5 hold Hooklying Single Knee to Chest Stretch - 2 x daily - 6 x weekly - 2 reps - 1 sets - 20 hold Heel Toe Raises with Counter Support - 2 x daily - 6 x weekly - 10 reps - 2 sets

## 2020-09-15 NOTE — Therapy (Signed)
[place], [place], [address] Phone: [phone]   Fax:  [phone]  Physical Therapy Evaluation  Patient Details  Name: Jonathan Bridges MRN: [id number] Date of Birth: 02-17-1946 Referring Provider (PT): Judith Part, MD   Encounter Date: 09/15/2020   PT End of Session - 09/15/20 1608    Visit Number 1    Number of Visits 16    Date for PT Re-Evaluation 12/08/20    Authorization Type [employer]    PT Start Time 1430    PT Stop Time 1515    PT Time Calculation (min) 45 min    Activity Tolerance Patient tolerated treatment well    Behavior During Therapy [facility] for tasks assessed/performed           Past Medical History:  Diagnosis Date  . Anxiety   . Basal cell carcinoma   . Chronic rhinitis   . Clotting disorder (HCC)    right leg   . Depression   . Diverticulosis   . Elevated LFTs   . History of hypertension   . Hypercholesterolemia   . Sleep apnea     Past Surgical History:  Procedure Laterality Date  . COLONOSCOPY    . WRIST SURGERY  01/24/2003   Right- ORIF: Dr. Amedeo Plenty    There were no vitals filed for this visit.    Subjective Assessment - 09/15/20 1437    Subjective he relays he had lumbar surgery July 28th to fix heniated disc. He has been a having a hard time since, still using SPC, still having Lt leg radiculopathy down to his mid shin. He has numbness and soreness in his big toe. He has had 2 falls since surgery, feels off balance and that he is has drop foot on Lt.    Pertinent History lumbar surgery 07/09/20    Currently in Pain? Yes    Pain Score 7     Pain Location Back    Pain Descriptors / Indicators Aching;Tingling    Pain Type Surgical pain    Pain Radiating Towards down Lt leg    Pain Onset More than a month ago    Pain Frequency Constant              [facility] PT Assessment - 09/15/20 0001      Assessment   Medical Diagnosis lumbar radiculopathy, post op disc herniation  operation    Referring Provider (PT) Judith Part, MD    Onset Date/Surgical Date 07/09/20    Next MD Visit 11/3    Prior Therapy for shoulder      Precautions   Precautions None      Balance Screen   Has the patient fallen in the past 6 months Yes    How many times? 2    Has the patient had a decrease in activity level because of a fear of falling?  Yes    Is the patient reluctant to leave their home because of a fear of falling?  No      Home Environment   Living Environment Private residence      Observation/Other Assessments-Edema    Edema --   Lt lower leg edema moderate     ROM / Strength   AROM / PROM / Strength AROM;Strength      AROM   AROM Assessment Site Lumbar    Lumbar Flexion 50%    Lumbar Extension 50%    Lumbar - Right Side Bend 50%  Lumbar - Left Side Bend 50%    Lumbar - Right Rotation 25%    Lumbar - Left Rotation 25%      Strength   Strength Assessment Site Knee;Hip;Ankle    Right/Left Hip Left    Left Hip Flexion 4+/5    Left Hip ABduction 4+/5    Right/Left Knee Left    Left Knee Flexion 4-/5    Left Knee Extension 4/5    Right/Left Ankle Left    Left Ankle Dorsiflexion 3-/5    Left Ankle Plantar Flexion 3-/5    Left Ankle Inversion 3-/5    Left Ankle Eversion 3-/5                      Objective measurements completed on examination: See above findings.       [facility] Adult PT Treatment/Exercise - 09/15/20 0001      Exercises   Exercises Lumbar;Other Exercises      Lumbar Exercises: Stretches   Single Knee to Chest Stretch Right;Left;2 reps;30 seconds    Lower Trunk Rotation 5 reps;10 seconds    Piriformis Stretch Left;2 reps;30 seconds      Lumbar Exercises: Standing   Other Standing Lumbar Exercises lumbar extension 10 reps holding 5 sec with elbows on wall      Lumbar Exercises: Supine   Bridge 10 reps;5 seconds      Manual Therapy   Manual therapy comments Lt leg LAD ,gentle                   PT Education - 09/15/20 1608    Education Details HEP, POC    Person(s) Educated Patient    Methods Explanation;Demonstration;Verbal cues;Handout    Comprehension Verbalized understanding;Returned demonstration            PT Short Term Goals - 09/15/20 1631      PT SHORT TERM GOAL #1   Title Pt will be I and compliant with HEP.    Time 4    Period Weeks    Status New    Target Date 10/13/20      PT SHORT TERM GOAL #2   Title Pt will reduce overall back pain >25%    Time 4    Period Weeks    Status New             PT Long Term Goals - 09/15/20 1631      PT LONG TERM GOAL #1   Title Pt will reduce overall pain >50%    Time 12    Period Weeks    Status New    Target Date 12/08/20      PT LONG TERM GOAL #2   Title Patient will demonstrate independent use of home exercise program progression to facilitate ability to maintain/progress functional gains from skilled physical therapy services.    Time 12    Period Weeks    Status New      PT LONG TERM GOAL #3   Title Pt will improve lumbar ROM to [facility]    Time 12    Period Weeks    Status New      PT LONG TERM GOAL #4   Title Pt will be able to ambulate community distances without AD and return to normal ADLS    Time 12    Period Weeks    Status New      PT LONG TERM GOAL #5   Title Patient will demonstrate Ltt  LE MMT 4+ throughout    Time 82    Period Weeks    Status New                  Plan - 09/15/20 1609    Clinical Impression Statement Pt presents for new PT evaluation for lumbar radiculopathy. He had spinal surgery 07/09/20 for this. MD referall does not list specific surgery but from what patient describes he did not have fusion and describes it more as a disectomy. He has overall Lt leg weakness, Lt foot drop, Lt leg radiculopathy, decreased lumbar ROM, decreased balance, and increased pain. He will benefit from skilled PT to address his deficits.    Examination-Activity Limitations  Bend;Dressing;Lift;Stand;Stairs;Squat;Sleep;Locomotion Level    Examination-Participation Restrictions Cleaning;Driving;Laundry;Yard Work;Shop    Stability/Clinical Decision Making Evolving/Moderate complexity    Clinical Decision Making Moderate    Rehab Potential Good    PT Frequency 2x / week    PT Duration 12 weeks   6-12 weeks   PT Treatment/Interventions ADLs/Self Care Home Management;Cryotherapy;Barrister's clerk;Therapeutic activities;Therapeutic exercise;Neuromuscular re-education;Manual techniques;Passive range of motion;Taping;Spinal Manipulations;Joint Manipulations;Dry needling    PT Next Visit Plan lumbar stretching, lumbar and Lt leg strengthening, Lt ankle strength, H.S. strength    PT Home Exercise Plan Access Code: [email or web address]    Consulted and Agree with Plan of Care Patient           Patient will benefit from skilled therapeutic intervention in order to improve the following deficits and impairments:  Abnormal gait, Decreased activity tolerance, Decreased balance, Decreased endurance, Decreased coordination, Decreased mobility, Decreased range of motion, Decreased strength, Difficulty walking, Increased muscle spasms, Pain, Improper body mechanics  Visit Diagnosis: Bilateral low back pain with left-sided sciatica, unspecified chronicity  Muscle weakness (generalized)  Other abnormalities of gait and mobility  Localized edema     Problem List Patient Active Problem List   Diagnosis Date Noted  . ED (erectile dysfunction) 08/09/2012  . Hypercholesterolemia   . Chronic rhinitis   . Hyperlipidemia 02/29/2012    [name] 09/15/2020, 4:35 PM  [facility] Physical [facility] [address] [place], [place], [address] Phone: [phone]   Fax:  [phone]  Name: Jonathan Bridges MRN: [id number] Date of Birth: 01-29-1946

## 2020-09-18 ENCOUNTER — Ambulatory Visit: Payer: Medicare PPO | Admitting: Physical Therapy

## 2020-09-18 ENCOUNTER — Other Ambulatory Visit: Payer: Self-pay

## 2020-09-18 DIAGNOSIS — R600 Localized edema: Secondary | ICD-10-CM

## 2020-09-18 DIAGNOSIS — M6281 Muscle weakness (generalized): Secondary | ICD-10-CM

## 2020-09-18 DIAGNOSIS — M5442 Lumbago with sciatica, left side: Secondary | ICD-10-CM | POA: Diagnosis not present

## 2020-09-18 DIAGNOSIS — R2689 Other abnormalities of gait and mobility: Secondary | ICD-10-CM

## 2020-09-18 NOTE — Therapy (Signed)
[facility] [address] [place], [place], [address] Phone: [phone]   Fax:  [phone]  Physical Therapy Treatment  Patient Details  Name: Jonathan Bridges MRN: [id number] Date of Birth: 1945/12/29 Referring Provider (PT): Judith Part, MD   Encounter Date: 09/18/2020   PT End of Session - 09/18/20 0856    Visit Number 2    Number of Visits 16    Date for PT Re-Evaluation 12/08/20    Authorization Type [employer]    PT Start Time 0805    PT Stop Time 0855    PT Time Calculation (min) 50 min    Activity Tolerance Patient tolerated treatment well    Behavior During Therapy [facility] for tasks assessed/performed           Past Medical History:  Diagnosis Date   Anxiety    Basal cell carcinoma    Chronic rhinitis    Clotting disorder ([place])    right leg    Depression    Diverticulosis    Elevated LFTs    History of hypertension    Hypercholesterolemia    Sleep apnea     Past Surgical History:  Procedure Laterality Date   COLONOSCOPY     WRIST SURGERY  01/24/2003   Right- ORIF: Dr. Amedeo Plenty    There were no vitals filed for this visit.   Subjective Assessment - 09/18/20 0810    Subjective He relays about 5/10 back pain and Lt leg radiculopathy into his calf.    Pertinent History lumbar surgery 07/09/20    Pain Onset More than a month ago                             [facility] Adult PT Treatment/Exercise - 09/18/20 0001      Lumbar Exercises: Stretches   Piriformis Stretch Left;3 reps;30 seconds    Piriformis Stretch Limitations seated    Gastroc Stretch Right;Left;3 reps;30 seconds    Gastroc Stretch Limitations slantboard      Lumbar Exercises: Aerobic   Recumbent Bike 5 min seat 12 LE/UE L5      Lumbar Exercises: Standing   Heel Raises Limitations heel toe rases 2 sets of 10    Functional Squats Limitations mini squats with UE support 2 sets of 10    Row Both;20 reps    Theraband Level (Row)  Level 3 (Green)    Shoulder Extension Both;20 reps    Theraband Level (Shoulder Extension) Level 3 (Green)    Other Standing Lumbar Exercises lumbar extension 2 sets of 10 reps holding 5 sec with elbows on wall      Lumbar Exercises: Seated   Long Arc Quad on Chair Left;2 sets;10 reps    LAQ on Chair Weights (lbs) 2    LAQ on Chair Limitations with DF sciatic nerve glide    Other Seated Lumbar Exercises seated ankle 4 way green band X 15 ea plane                  PT Education - 09/18/20 0855    Education Details added ankle 4 way into hep for for more ankle strength, measured him for compression socks MD recommended and he is size large            PT Short Term Goals - 09/15/20 1631      PT SHORT TERM GOAL #1   Title Pt will be I and compliant with HEP.  Time 4    Period Weeks    Status New    Target Date 10/13/20      PT SHORT TERM GOAL #2   Title Pt will reduce overall back pain >25%    Time 4    Period Weeks    Status New             PT Long Term Goals - 09/15/20 1631      PT LONG TERM GOAL #1   Title Pt will reduce overall pain >50%    Time 12    Period Weeks    Status New    Target Date 12/08/20      PT LONG TERM GOAL #2   Title Patient will demonstrate independent use of home exercise program progression to facilitate ability to maintain/progress functional gains from skilled physical therapy services.    Time 12    Period Weeks    Status New      PT LONG TERM GOAL #3   Title Pt will improve lumbar ROM to [facility]    Time 12    Period Weeks    Status New      PT LONG TERM GOAL #4   Title Pt will be able to ambulate community distances without AD and return to normal ADLS    Time 12    Period Weeks    Status New      PT LONG TERM GOAL #5   Title [name] 4+ throughout    Time 12    Period Weeks    Status New                 Plan - 09/18/20 0857    Clinical Impression Statement Added more ankle  strength today as he is very weak in his Lt ankle. Measured him for compression socks that MD recommended due to his left leg edema. Continued with overall leg/lumbar strength and stretching to his tolerance. Continue POC    Examination-Activity Limitations Bend;Dressing;Lift;Stand;Stairs;Squat;Sleep;Locomotion Level    Examination-Participation Restrictions Cleaning;Driving;Laundry;Yard Work;Shop    Stability/Clinical Decision Making Evolving/Moderate complexity    Rehab Potential Good    PT Frequency 2x / week    PT Duration 12 weeks   6-12 weeks   PT Treatment/Interventions ADLs/Self Care Home Management;Cryotherapy;Barrister's clerk;Therapeutic activities;Therapeutic exercise;Neuromuscular re-education;Manual techniques;Passive range of motion;Taping;Spinal Manipulations;Joint Manipulations;Dry needling    PT Next Visit Plan lumbar stretching, lumbar and Lt leg strengthening, Lt ankle strength, H.S. strength    PT Home Exercise Plan Access Code: [email or web address], added ankle 4 way    Consulted and Agree with Plan of Care Patient           Patient will benefit from skilled therapeutic intervention in order to improve the following deficits and impairments:  Abnormal gait, Decreased activity tolerance, Decreased balance, Decreased endurance, Decreased coordination, Decreased mobility, Decreased range of motion, Decreased strength, Difficulty walking, Increased muscle spasms, Pain, Improper body mechanics  Visit Diagnosis: Bilateral low back pain with left-sided sciatica, unspecified chronicity  Muscle weakness (generalized)  Other abnormalities of gait and mobility  Localized edema     Problem List Patient Active Problem List   Diagnosis Date Noted   ED (erectile dysfunction) 08/09/2012   Hypercholesterolemia    Chronic rhinitis    Hyperlipidemia 02/29/2012    [name] 09/18/2020, 9:01 AM  [facility]  Physical Therapy [address] [place], [place], [address] Phone: [phone]   Fax:  [phone]  Name: Jonathan Bridges MRN: [id number] Date of Birth: Apr 17, 1946

## 2020-09-26 ENCOUNTER — Other Ambulatory Visit: Payer: Self-pay

## 2020-09-26 ENCOUNTER — Ambulatory Visit: Payer: Medicare PPO | Admitting: Physical Therapy

## 2020-09-26 DIAGNOSIS — R600 Localized edema: Secondary | ICD-10-CM

## 2020-09-26 DIAGNOSIS — M6281 Muscle weakness (generalized): Secondary | ICD-10-CM | POA: Diagnosis not present

## 2020-09-26 DIAGNOSIS — R2689 Other abnormalities of gait and mobility: Secondary | ICD-10-CM | POA: Diagnosis not present

## 2020-09-26 DIAGNOSIS — M5442 Lumbago with sciatica, left side: Secondary | ICD-10-CM | POA: Diagnosis not present

## 2020-09-26 NOTE — Therapy (Signed)
[place], [place], [address] Phone: [phone]   Fax:  [phone]  Physical Therapy Treatment  Patient Details  Name: Jonathan Bridges MRN: [id number] Date of Birth: Aug 09, 1946 Referring Provider (PT): Judith Part, MD   Encounter Date: 09/26/2020   PT End of Session - 09/26/20 1110    Visit Number 3    Number of Visits 16    Date for PT Re-Evaluation 12/08/20    Authorization Type [employer]    PT Start Time 2330    PT Stop Time 1105    PT Time Calculation (min) 50 min    Activity Tolerance Patient tolerated treatment well    Behavior During Therapy [facility] for tasks assessed/performed           Past Medical History:  Diagnosis Date  . Anxiety   . Basal cell carcinoma   . Chronic rhinitis   . Clotting disorder (HCC)    right leg   . Depression   . Diverticulosis   . Elevated LFTs   . History of hypertension   . Hypercholesterolemia   . Sleep apnea     Past Surgical History:  Procedure Laterality Date  . COLONOSCOPY    . WRIST SURGERY  01/24/2003   Right- ORIF: Dr. Amedeo Plenty    There were no vitals filed for this visit.   Subjective Assessment - 09/26/20 1049    Subjective He relays about 5/10 back pain and Lt leg radiculopathy into his calf but not as bad, does still have swelling in his Lt leg    Pertinent History lumbar surgery 07/09/20    Pain Onset More than a month ago              [facility] Adult PT Treatment/Exercise - 09/26/20 0001      Therapeutic Activites    Therapeutic Activities Other Therapeutic Activities    Other Therapeutic Activities up down one flight of stairs, up with bad leg for strengthening, down with bad leg first for safety, using one handrail and cane on other side      Lumbar Exercises: Stretches   Piriformis Stretch Left;3 reps;30 seconds    Piriformis Stretch Limitations seated    Gastroc Stretch Right;Left;3 reps;30 seconds    Gastroc Stretch Limitations  slantboard    Other Lumbar Stretch Exercise lumbar extension 10 reps holding 5 sec with elbows on wall      Lumbar Exercises: Aerobic   Recumbent Bike 10 min L1      Lumbar Exercises: Standing   Heel Raises Limitations heel toe rases 2 sets of 10    Row Both;20 reps    Theraband Level (Row) Level 3 (Green)    Shoulder Extension Both;20 reps    Theraband Level (Shoulder Extension) Level 3 (Green)    Other Standing Lumbar Exercises step ups Lt X 15 reps, one hand support 6 inch step    Other Standing Lumbar Exercises rocker board 2 min A-P, 2 min lateral with bilat fingertip support      Lumbar Exercises: Seated   Long Arc Quad on Chair Left;3 sets;10 reps    LAQ on Chair Weights (lbs) 4    Other Seated Lumbar Exercises seated march Lt 4 lbs 3X10    Other Seated Lumbar Exercises sit to stand airex pad in chair 2 sets of 5, no UE support, slow eccentric lower  PT Short Term Goals - 09/15/20 1631      PT SHORT TERM GOAL #1   Title Pt will be I and compliant with HEP.    Time 4    Period Weeks    Status New    Target Date 10/13/20      PT SHORT TERM GOAL #2   Title Pt will reduce overall back pain >25%    Time 4    Period Weeks    Status New             PT Long Term Goals - 09/15/20 1631      PT LONG TERM GOAL #1   Title Pt will reduce overall pain >50%    Time 12    Period Weeks    Status New    Target Date 12/08/20      PT LONG TERM GOAL #2   Title Patient will demonstrate independent use of home exercise program progression to facilitate ability to maintain/progress functional gains from skilled physical therapy services.    Time 12    Period Weeks    Status New      PT LONG TERM GOAL #3   Title Pt will improve lumbar ROM to [facility]    Time 12    Period Weeks    Status New      PT LONG TERM GOAL #4   Title Pt will be able to ambulate community distances without AD and return to normal ADLS    Time 12    Period Weeks    Status  New      PT LONG TERM GOAL #5   Title [name] 4+ throughout    Time 12    Period Weeks    Status New                 Plan - 09/26/20 1110    Clinical Impression Statement Progressed his overall left leg strength today with good tolerance. Gait is improving some as well as radicular symptoms but still with deficits. He will continue to benefit from PT.    Examination-Activity Limitations Bend;Dressing;Lift;Stand;Stairs;Squat;Sleep;Locomotion Level    Examination-Participation Restrictions Cleaning;Driving;Laundry;Yard Work;Shop    Stability/Clinical Decision Making Evolving/Moderate complexity    Rehab Potential Good    PT Frequency 2x / week    PT Duration 12 weeks   6-12 weeks   PT Treatment/Interventions ADLs/Self Care Home Management;Cryotherapy;Barrister's clerk;Therapeutic activities;Therapeutic exercise;Neuromuscular re-education;Manual techniques;Passive range of motion;Taping;Spinal Manipulations;Joint Manipulations;Dry needling    PT Next Visit Plan lumbar stretching, lumbar and Lt leg strengthening, Lt ankle strength, H.S. strength    PT Home Exercise Plan Access Code: [email or web address], added ankle 4 way    Consulted and Agree with Plan of Care Patient           Patient will benefit from skilled therapeutic intervention in order to improve the following deficits and impairments:  Abnormal gait, Decreased activity tolerance, Decreased balance, Decreased endurance, Decreased coordination, Decreased mobility, Decreased range of motion, Decreased strength, Difficulty walking, Increased muscle spasms, Pain, Improper body mechanics  Visit Diagnosis: Bilateral low back pain with left-sided sciatica, unspecified chronicity  Muscle weakness (generalized)  Other abnormalities of gait and mobility  Localized edema     Problem List Patient Active Problem List   Diagnosis Date Noted  . ED (erectile  dysfunction) 08/09/2012  . Hypercholesterolemia   . Chronic rhinitis   . Hyperlipidemia 02/29/2012    Debbe Odea, PT,DPT 09/26/2020,  [facility] [address] [place], [place], [address] Phone: [phone]   Fax:  [phone]  Name: Jonathan Bridges MRN: [id number] Date of Birth: 1946/11/01

## 2020-09-29 ENCOUNTER — Encounter: Payer: Self-pay | Admitting: Physical Therapy

## 2020-09-29 ENCOUNTER — Other Ambulatory Visit: Payer: Self-pay

## 2020-09-29 ENCOUNTER — Ambulatory Visit: Payer: Medicare PPO | Admitting: Physical Therapy

## 2020-09-29 DIAGNOSIS — M5442 Lumbago with sciatica, left side: Secondary | ICD-10-CM

## 2020-09-29 DIAGNOSIS — R2689 Other abnormalities of gait and mobility: Secondary | ICD-10-CM

## 2020-09-29 DIAGNOSIS — R600 Localized edema: Secondary | ICD-10-CM

## 2020-09-29 DIAGNOSIS — M6281 Muscle weakness (generalized): Secondary | ICD-10-CM

## 2020-09-29 NOTE — Patient Instructions (Signed)
Access Code: [email or web address] URL: [email or web address] Date: 09/29/2020 Prepared by: Elsie Ra  Exercises Standing Lumbar Extension at [place] - 2 x daily - 6 x weekly - 2-3 sets - 10 reps - 5 hold Bent Knee Fall[place] - 2 x daily - 6 x weekly - 1-2 sets - 10 reps Hooklying Single Knee to Chest Stretch - 2 x daily - 6 x weekly - 2 reps - 1 sets - 20 hold Supine Bridge - 2 x daily - 6 x weekly - 10 reps - 1 sets - 5 hold Supine Active Straight Leg Raise - 2 x daily - 6 x weekly - 10 reps - 1-2 sets Seated Figure 4 Piriformis Stretch - 2 x daily - 6 x weekly - 1 sets - 3 reps - 30 hold Heel Toe Raises with Counter Support - 2 x daily - 6 x weekly - 10 reps - 2 sets Supine Ankle Dorsiflexion and Plantarflexion AROM - 2 x daily - 6 x weekly - 2-3 sets - 10 reps Supine Ankle Inversion and Eversion AROM - 2 x daily - 6 x weekly - 2-3 sets - 10 reps Sit to Stand without Arm Support - 2 x daily - 6 x weekly - 1-2 sets - 10 reps

## 2020-09-29 NOTE — Therapy (Signed)
[place], [place], [address] Phone: [phone]   Fax:  [phone]  Physical Therapy Treatment  Patient Details  Name: Jonathan Bridges MRN: [id number] Date of Birth: 01/13/1946 Referring Provider (PT): Judith Part, MD   Encounter Date: 09/29/2020   PT End of Session - 09/29/20 1357    Visit Number 4    Number of Visits 16    Date for PT Re-Evaluation 12/08/20    Authorization Type [employer]    PT Start Time 1300    PT Stop Time 1345    PT Time Calculation (min) 45 min    Activity Tolerance Patient tolerated treatment well    Behavior During Therapy [facility] for tasks assessed/performed           Past Medical History:  Diagnosis Date  . Anxiety   . Basal cell carcinoma   . Chronic rhinitis   . Clotting disorder (HCC)    right leg   . Depression   . Diverticulosis   . Elevated LFTs   . History of hypertension   . Hypercholesterolemia   . Sleep apnea     Past Surgical History:  Procedure Laterality Date  . COLONOSCOPY    . WRIST SURGERY  01/24/2003   Right- ORIF: Dr. Amedeo Plenty    There were no vitals filed for this visit.   Subjective Assessment - 09/29/20 1321    Subjective he relays his leg strength is starting to come back some , he feels the ankle exercises may be too difficult as he hurts the next day after them.    Pertinent History lumbar surgery 07/09/20    Currently in Pain? Yes    Pain Score 3     Pain Location Back    Pain Descriptors / Indicators Aching;Numbness    Pain Onset More than a month ago                             [facility] Adult PT Treatment/Exercise - 09/29/20 0001      Lumbar Exercises: Stretches   Lower Trunk Rotation Limitations bent knee fall outs 2 sets of 10 bilat    Other Lumbar Stretch Exercise lumbar extension 10 reps holding 5 sec with elbows on wall      Lumbar Exercises: Aerobic   Recumbent Bike 10 min L2      Lumbar Exercises: Standing    Heel Raises Limitations heel toe rases 2 sets of 10    Functional Squats Limitations mini squats with UE support 2 sets of 10    Other Standing Lumbar Exercises step ups Lt X 15 reps, one hand support 6 inch step      Lumbar Exercises: Seated   Other Seated Lumbar Exercises sit to stand airex pad in chair 2 sets of 10, no UE support, slow eccentric lower      Lumbar Exercises: Supine   Straight Leg Raises Limitations 2 sets of 10 bilat    Other Supine Lumbar Exercises Lt ankle 4 way 25 reps 3 lbs. Ankle circles 3 lbs X 15 ea, ankle alphabet 1 time 3 lbs                    PT Short Term Goals - 09/15/20 1631      PT SHORT TERM GOAL #1   Title Pt will be I and compliant with HEP.    Time 4    Period  Weeks    Status New    Target Date 10/13/20      PT SHORT TERM GOAL #2   Title Pt will reduce overall back pain >25%    Time 4    Period Weeks    Status New             PT Long Term Goals - 09/15/20 1631      PT LONG TERM GOAL #1   Title Pt will reduce overall pain >50%    Time 12    Period Weeks    Status New    Target Date 12/08/20      PT LONG TERM GOAL #2   Title Patient will demonstrate independent use of home exercise program progression to facilitate ability to maintain/progress functional gains from skilled physical therapy services.    Time 12    Period Weeks    Status New      PT LONG TERM GOAL #3   Title Pt will improve lumbar ROM to [facility]    Time 12    Period Weeks    Status New      PT LONG TERM GOAL #4   Title Pt will be able to ambulate community distances without AD and return to normal ADLS    Time 12    Period Weeks    Status New      PT LONG TERM GOAL #5   Title [name] 4+ throughout    Time 12    Period Weeks    Status New                 Plan - 09/29/20 1358    Clinical Impression Statement Changed up his HEP some as he was having pain and difficulty with band resisted ankle work and LTR so  this was switched to ankle cuff resisted exercises in more gravity eliminated position as well as LTR switched to bent knee fall outs. He recieved new HEP print out of this along with more leg strengthening. He showed good understanding of this and had good tolreance. Continue POC.    Examination-Activity Limitations Bend;Dressing;Lift;Stand;Stairs;Squat;Sleep;Locomotion Level    Examination-Participation Restrictions Cleaning;Driving;Laundry;Yard Work;Shop    Stability/Clinical Decision Making Evolving/Moderate complexity    Rehab Potential Good    PT Frequency 2x / week    PT Duration 12 weeks   6-12 weeks   PT Treatment/Interventions ADLs/Self Care Home Management;Cryotherapy;Barrister's clerk;Therapeutic activities;Therapeutic exercise;Neuromuscular re-education;Manual techniques;Passive range of motion;Taping;Spinal Manipulations;Joint Manipulations;Dry needling    PT Next Visit Plan lumbar stretching, lumbar and Lt leg strengthening, Lt ankle strength, H.S. strength    PT Home Exercise Plan Access Code: [email or web address], added ankle 4 way    Consulted and Agree with Plan of Care Patient           Patient will benefit from skilled therapeutic intervention in order to improve the following deficits and impairments:  Abnormal gait, Decreased activity tolerance, Decreased balance, Decreased endurance, Decreased coordination, Decreased mobility, Decreased range of motion, Decreased strength, Difficulty walking, Increased muscle spasms, Pain, Improper body mechanics  Visit Diagnosis: Bilateral low back pain with left-sided sciatica, unspecified chronicity  Muscle weakness (generalized)  Other abnormalities of gait and mobility  Localized edema     Problem List Patient Active Problem List   Diagnosis Date Noted  . ED (erectile dysfunction) 08/09/2012  . Hypercholesterolemia   . Chronic rhinitis   . Hyperlipidemia 02/29/2012    Jonathan Bridges 09/29/2020,  2:02 PM  [facility] Physical Therapy [address] [place], [place], [address] Phone: [phone]   Fax:  [phone]  Name: Jonathan Bridges MRN: [id number] Date of Birth: [birth year]

## 2020-10-01 DIAGNOSIS — L219 Seborrheic dermatitis, unspecified: Secondary | ICD-10-CM | POA: Diagnosis not present

## 2020-10-01 DIAGNOSIS — L570 Actinic keratosis: Secondary | ICD-10-CM | POA: Diagnosis not present

## 2020-10-01 DIAGNOSIS — L821 Other seborrheic keratosis: Secondary | ICD-10-CM | POA: Diagnosis not present

## 2020-10-02 ENCOUNTER — Ambulatory Visit: Payer: Medicare PPO | Admitting: Physical Therapy

## 2020-10-02 ENCOUNTER — Other Ambulatory Visit: Payer: Self-pay

## 2020-10-02 DIAGNOSIS — M5442 Lumbago with sciatica, left side: Secondary | ICD-10-CM

## 2020-10-02 DIAGNOSIS — M6281 Muscle weakness (generalized): Secondary | ICD-10-CM | POA: Diagnosis not present

## 2020-10-02 DIAGNOSIS — R600 Localized edema: Secondary | ICD-10-CM | POA: Diagnosis not present

## 2020-10-02 DIAGNOSIS — R2689 Other abnormalities of gait and mobility: Secondary | ICD-10-CM | POA: Diagnosis not present

## 2020-10-02 NOTE — Therapy (Signed)
[facility] Physical Therapy [address] [place], [place], [address] Phone: [phone]   Fax:  [phone]  Physical Therapy Treatment  Patient Details  Name: Jonathan Bridges MRN: [id number] Date of Birth: 31-Oct-1946 Referring Provider (PT): Judith Part, MD   Encounter Date: 10/02/2020   PT End of Session - 10/02/20 0936    Visit Number 5    Number of Visits 16    Date for PT Re-Evaluation 12/08/20    Authorization Type [employer], 12 viists authorized until 12/27    Authorization - Visit Number 5    Authorization - Number of Visits 12    PT Start Time 0845    PT Stop Time 0933    PT Time Calculation (min) 48 min    Activity Tolerance Patient tolerated treatment well    Behavior During Therapy [facility] for tasks assessed/performed           Past Medical History:  Diagnosis Date   Anxiety    Basal cell carcinoma    Chronic rhinitis    Clotting disorder ([place])    right leg    Depression    Diverticulosis    Elevated LFTs    History of hypertension    Hypercholesterolemia    Sleep apnea     Past Surgical History:  Procedure Laterality Date   COLONOSCOPY     WRIST SURGERY  01/24/2003   Right- ORIF: Dr. Amedeo Plenty    There were no vitals filed for this visit.   Subjective Assessment - 10/02/20 0927    Subjective he relays the back pain is about the same 5/10 overall.    Pertinent History lumbar surgery 07/09/20    Pain Onset More than a month ago              [facility] Adult PT Treatment/Exercise - 10/02/20 0001      Lumbar Exercises: Stretches   Piriformis Stretch Left;4 reps;20 seconds    Piriformis Stretch Limitations seated    Gastroc Stretch Right;Left;3 reps;30 seconds    Gastroc Stretch Limitations slantboard    Other Lumbar Stretch Exercise lumbar extension 10 reps holding 5 sec with elbows on wall      Lumbar Exercises: Aerobic   Recumbent Bike seat 8 10 min L3      Lumbar Exercises: Machines for Strengthening    Cybex Knee Extension 5 lbs bilat push with slow eccentrics 3X10    Cybex Knee Flexion 25 lbs bilat pull 3X10    Leg Press Lt leg only 25 lbs 3X10      Lumbar Exercises: Standing   Heel Raises Limitations heel toe rases 2 sets of 15    Functional Squats Limitations mini squats with UE support 2 sets of 10                        Other Standing Lumbar Exercises step ups fwd and lateral Lt X 15 reps, one hand support 6 inch step      Lumbar Exercises: Seated   Other Seated Lumbar Exercises seated ankle DF with 3 lbs on Lt 2X10 reps, then cirlces X 10 ea, then romoved weight and did one more set of 10 circles                    PT Short Term Goals - 09/15/20 1631      PT SHORT TERM GOAL #1   Title Pt will be I and compliant with HEP.  Time 4    Period Weeks    Status New    Target Date 10/13/20      PT SHORT TERM GOAL #2   Title Pt will reduce overall back pain >25%    Time 4    Period Weeks    Status New             PT Long Term Goals - 09/15/20 1631      PT LONG TERM GOAL #1   Title Pt will reduce overall pain >50%    Time 12    Period Weeks    Status New    Target Date 12/08/20      PT LONG TERM GOAL #2   Title Patient will demonstrate independent use of home exercise program progression to facilitate ability to maintain/progress functional gains from skilled physical therapy services.    Time 12    Period Weeks    Status New      PT LONG TERM GOAL #3   Title Pt will improve lumbar ROM to [facility]    Time 12    Period Weeks    Status New      PT LONG TERM GOAL #4   Title Pt will be able to ambulate community distances without AD and return to normal ADLS    Time 12    Period Weeks    Status New      PT LONG TERM GOAL #5   Title [name] 4+ throughout    Time 12    Period Weeks    Status New                 Plan - 10/02/20 6606    Clinical Impression Statement Progressed overall Left leg strength as  tolerated today. He will continue to benefit from strengthening, and motor control for Lt LE.    Examination-Activity Limitations Bend;Dressing;Lift;Stand;Stairs;Squat;Sleep;Locomotion Level    Examination-Participation Restrictions Cleaning;Driving;Laundry;Yard Work;Shop    Stability/Clinical Decision Making Evolving/Moderate complexity    Rehab Potential Good    PT Frequency 2x / week    PT Duration 12 weeks   6-12 weeks   PT Treatment/Interventions ADLs/Self Care Home Management;Cryotherapy;Barrister's clerk;Therapeutic activities;Therapeutic exercise;Neuromuscular re-education;Manual techniques;Passive range of motion;Taping;Spinal Manipulations;Joint Manipulations;Dry needling    PT Next Visit Plan lumbar stretching, lumbar and Lt leg strengthening, Lt ankle strength, H.S. strength    PT Home Exercise Plan Access Code: [email or web address], added ankle 4 way    Consulted and Agree with Plan of Care Patient           Patient will benefit from skilled therapeutic intervention in order to improve the following deficits and impairments:  Abnormal gait, Decreased activity tolerance, Decreased balance, Decreased endurance, Decreased coordination, Decreased mobility, Decreased range of motion, Decreased strength, Difficulty walking, Increased muscle spasms, Pain, Improper body mechanics  Visit Diagnosis: Bilateral low back pain with left-sided sciatica, unspecified chronicity  Muscle weakness (generalized)  Other abnormalities of gait and mobility  Localized edema     Problem List Patient Active Problem List   Diagnosis Date Noted   ED (erectile dysfunction) 08/09/2012   Hypercholesterolemia    Chronic rhinitis    Hyperlipidemia 02/29/2012    [name] 10/02/2020, 9:49 AM  [facility] Physical Therapy [address] [place], [place], [address] Phone: [phone]   Fax:  [phone]  Name: Jonathan Bridges MRN: [id number] Date of Birth: 1946-11-18

## 2020-10-04 DIAGNOSIS — Z79891 Long term (current) use of opiate analgesic: Secondary | ICD-10-CM | POA: Diagnosis not present

## 2020-10-04 DIAGNOSIS — Z86718 Personal history of other venous thrombosis and embolism: Secondary | ICD-10-CM | POA: Diagnosis not present

## 2020-10-04 DIAGNOSIS — E785 Hyperlipidemia, unspecified: Secondary | ICD-10-CM | POA: Diagnosis not present

## 2020-10-04 DIAGNOSIS — G8929 Other chronic pain: Secondary | ICD-10-CM | POA: Diagnosis not present

## 2020-10-04 DIAGNOSIS — Z6826 Body mass index (BMI) 26.0-26.9, adult: Secondary | ICD-10-CM | POA: Diagnosis not present

## 2020-10-04 DIAGNOSIS — I10 Essential (primary) hypertension: Secondary | ICD-10-CM | POA: Diagnosis not present

## 2020-10-04 DIAGNOSIS — M1990 Unspecified osteoarthritis, unspecified site: Secondary | ICD-10-CM | POA: Diagnosis not present

## 2020-10-04 DIAGNOSIS — E663 Overweight: Secondary | ICD-10-CM | POA: Diagnosis not present

## 2020-10-04 DIAGNOSIS — Z7901 Long term (current) use of anticoagulants: Secondary | ICD-10-CM | POA: Diagnosis not present

## 2020-10-07 ENCOUNTER — Ambulatory Visit: Payer: Medicare PPO | Admitting: Physical Therapy

## 2020-10-07 ENCOUNTER — Other Ambulatory Visit: Payer: Self-pay

## 2020-10-07 DIAGNOSIS — M5442 Lumbago with sciatica, left side: Secondary | ICD-10-CM

## 2020-10-07 DIAGNOSIS — R600 Localized edema: Secondary | ICD-10-CM

## 2020-10-07 DIAGNOSIS — R2689 Other abnormalities of gait and mobility: Secondary | ICD-10-CM

## 2020-10-07 DIAGNOSIS — M6281 Muscle weakness (generalized): Secondary | ICD-10-CM

## 2020-10-07 NOTE — Therapy (Signed)
[place], [place], [address] Phone: [phone]   Fax:  [phone]  Physical Therapy Treatment  Patient Details  Name: Jonathan Bridges MRN: [id number] Date of Birth: Mar 21, 1946 Referring Provider (PT): Judith Part, MD   Encounter Date: 10/07/2020   PT End of Session - 10/07/20 1133    Visit Number 6    Number of Visits 16    Date for PT Re-Evaluation 12/08/20    Authorization Type [employer], 12 viists authorized until 12/27    Authorization - Visit Number 6    Authorization - Number of Visits 12    PT Start Time 1100    PT Stop Time 1145    PT Time Calculation (min) 45 min    Activity Tolerance Patient tolerated treatment well    Behavior During Therapy [facility] for tasks assessed/performed           Past Medical History:  Diagnosis Date  . Anxiety   . Basal cell carcinoma   . Chronic rhinitis   . Clotting disorder (HCC)    right leg   . Depression   . Diverticulosis   . Elevated LFTs   . History of hypertension   . Hypercholesterolemia   . Sleep apnea     Past Surgical History:  Procedure Laterality Date  . COLONOSCOPY    . WRIST SURGERY  01/24/2003   Right- ORIF: Dr. Amedeo Plenty    There were no vitals filed for this visit.   Subjective Assessment - 10/07/20 1133    Subjective he relays the back pain is about the same 4-5/10 overall. He will follow up with MD next week    Pertinent History lumbar surgery 07/09/20    Pain Onset More than a month ago              [facility] PT Assessment - 10/07/20 0001      Assessment   Medical Diagnosis lumbar radiculopathy, post op disc herniation operation    Referring Provider (PT) Judith Part, MD      AROM   Lumbar Flexion 75%    Lumbar Extension 75%    Lumbar - Right Side Bend 75%    Lumbar - Left Side Bend 75%    Lumbar - Right Rotation 50%    Lumbar - Left Rotation 50%      Strength   Left Hip Flexion 4+/5    Left Hip ABduction 4+/5     Left Knee Flexion 4/5    Left Knee Extension 4+/5    Left Ankle Dorsiflexion 3/5    Left Ankle Plantar Flexion 3/5    Left Ankle Inversion 3/5    Left Ankle Eversion 3/5                         [facility] Adult PT Treatment/Exercise - 10/07/20 0001      Lumbar Exercises: Stretches   Piriformis Stretch Left;3 reps;30 seconds    Piriformis Stretch Limitations seated    Gastroc Stretch Right;Left;3 reps;30 seconds    Gastroc Stretch Limitations slantboard    Other Lumbar Stretch Exercise lumbar extension 10 reps holding 5 sec with elbows on wall      Lumbar Exercises: Aerobic   Recumbent Bike seat 8 10 min L3      Lumbar Exercises: Machines for Strengthening   Cybex Knee Extension 5 lbs bilat push with slow eccentrics 3X10    Cybex Knee Flexion 25 lbs bilat pull  3X10    Leg Press Lt leg only 31 lbs 3X10      Lumbar Exercises: Standing   Heel Raises Limitations heel toe rases 2 sets of 15    Functional Squats Limitations --    Other Standing Lumbar Exercises --      Lumbar Exercises: Seated   Other Seated Lumbar Exercises --    Other Seated Lumbar Exercises sit to stand no UE support 2 sets of 10, slightly raised mat surface      Lumbar Exercises: Supine   Clam Limitations unilat green X 20 on both sides    Bridge 5 seconds;15 reps    Straight Leg Raises Limitations 2 sets of 10 bilat    Other Supine Lumbar Exercises Lt ankle 4 way 2 sets of 20 reps. 3 lbs. Ankle circles X 20 reps                    PT Short Term Goals - 09/15/20 1631      PT SHORT TERM GOAL #1   Title Pt will be I and compliant with HEP.    Time 4    Period Weeks    Status New    Target Date 10/13/20      PT SHORT TERM GOAL #2   Title Pt will reduce overall back pain >25%    Time 4    Period Weeks    Status New             PT Long Term Goals - 10/07/20 1322      PT LONG TERM GOAL #1   Title Pt will reduce overall pain >50%    Baseline still about 4-5 out of 10     Time 12    Period Weeks    Status On-going      PT LONG TERM GOAL #2   Title Patient will demonstrate independent use of home exercise program progression to facilitate ability to maintain/progress functional gains from skilled physical therapy services.    Baseline independent with current but will eventually need progression    Time 12    Period Weeks    Status On-going      PT LONG TERM GOAL #3   Title Pt will improve lumbar ROM to [facility]    Baseline 50-75%    Time 12    Period Weeks    Status On-going      PT LONG TERM GOAL #4   Title Pt will be able to ambulate community distances without AD and return to normal ADLS    Baseline uses AD for community    Time 12    Period Weeks    Status On-going      PT LONG TERM GOAL #5   Title [name] 4+ throughout    Baseline 4 overall    Time 12    Period Weeks    Status On-going                 Plan - 10/07/20 1144    Clinical Impression Statement He is progressing with Left leg strength and lumbar ROM with updated measurments however still with significant deficits and will continue to benefit from PT.    Examination-Activity Limitations Bend;Dressing;Lift;Stand;Stairs;Squat;Sleep;Locomotion Level    Examination-Participation Restrictions Cleaning;Driving;Laundry;Yard Work;Shop    Stability/Clinical Decision Making Evolving/Moderate complexity    Rehab Potential Good    PT Frequency 2x / week    PT Duration 12 weeks  6-12 weeks   PT Treatment/Interventions ADLs/Self Care Home Management;Cryotherapy;Barrister's clerk;Therapeutic activities;Therapeutic exercise;Neuromuscular re-education;Manual techniques;Passive range of motion;Taping;Spinal Manipulations;Joint Manipulations;Dry needling    PT Next Visit Plan lumbar stretching, lumbar and Lt leg strengthening, Lt ankle strength, H.S. strength    PT Home Exercise Plan Access Code: [email or web address], added  ankle 4 way    Consulted and Agree with Plan of Care Patient           Patient will benefit from skilled therapeutic intervention in order to improve the following deficits and impairments:  Abnormal gait, Decreased activity tolerance, Decreased balance, Decreased endurance, Decreased coordination, Decreased mobility, Decreased range of motion, Decreased strength, Difficulty walking, Increased muscle spasms, Pain, Improper body mechanics  Visit Diagnosis: Bilateral low back pain with left-sided sciatica, unspecified chronicity  Muscle weakness (generalized)  Other abnormalities of gait and mobility  Localized edema     Problem List Patient Active Problem List   Diagnosis Date Noted  . ED (erectile dysfunction) 08/09/2012  . Hypercholesterolemia   . Chronic rhinitis   . Hyperlipidemia 02/29/2012    [name] 10/07/2020, 1:26 PM  [facility] Physical Therapy [address] [place], [place], [address] Phone: [phone]   Fax:  [phone]  Name: Jonathan Bridges MRN: [id number] Date of Birth: Apr 03, 1946

## 2020-10-13 ENCOUNTER — Other Ambulatory Visit: Payer: Self-pay

## 2020-10-13 ENCOUNTER — Ambulatory Visit: Payer: Medicare PPO | Admitting: Physical Therapy

## 2020-10-13 DIAGNOSIS — M6281 Muscle weakness (generalized): Secondary | ICD-10-CM | POA: Diagnosis not present

## 2020-10-13 DIAGNOSIS — R600 Localized edema: Secondary | ICD-10-CM | POA: Diagnosis not present

## 2020-10-13 DIAGNOSIS — R2689 Other abnormalities of gait and mobility: Secondary | ICD-10-CM

## 2020-10-13 DIAGNOSIS — M5442 Lumbago with sciatica, left side: Secondary | ICD-10-CM | POA: Diagnosis not present

## 2020-10-13 NOTE — Therapy (Signed)
[place], [place], [address] Phone: [phone]   Fax:  [phone]  Physical Therapy Treatment  Patient Details  Name: Jonathan Bridges MRN: [id number] Date of Birth: 1946/01/11 Referring Provider (PT): Judith Part, MD   Encounter Date: 10/13/2020   PT End of Session - 10/13/20 0843    Visit Number 7    Number of Visits 16    Date for PT Re-Evaluation 12/08/20    Authorization Type [employer], 12 viists authorized until 12/27    Authorization - Visit Number 7    Authorization - Number of Visits 12    PT Start Time 0800    PT Stop Time 0845    PT Time Calculation (min) 45 min    Activity Tolerance Patient tolerated treatment well    Behavior During Therapy [facility] for tasks assessed/performed           Past Medical History:  Diagnosis Date  . Anxiety   . Basal cell carcinoma   . Chronic rhinitis   . Clotting disorder (HCC)    right leg   . Depression   . Diverticulosis   . Elevated LFTs   . History of hypertension   . Hypercholesterolemia   . Sleep apnea     Past Surgical History:  Procedure Laterality Date  . COLONOSCOPY    . WRIST SURGERY  01/24/2003   Right- ORIF: Dr. Amedeo Plenty    There were no vitals filed for this visit.   Subjective Assessment - 10/13/20 0819    Subjective he relays the back pain is a little better about 4 out of 10 but its not really in his back its in his left buttock and feels numb. He will see MD this week for follow up    Pertinent History lumbar surgery 07/09/20    Pain Onset More than a month ago            [facility] Adult PT Treatment/Exercise - 10/13/20 0001      Lumbar Exercises: Stretches   Piriformis Stretch Left;3 reps;30 seconds    Piriformis Stretch Limitations seated    Gastroc Stretch Right;Left;3 reps;30 seconds    Gastroc Stretch Limitations slantboard    Other Lumbar Stretch Exercise seated lumbar flexion pball roll outs 10 sec X 10 reps    Other Lumbar  Stretch Exercise lumbar extension 10 reps, 2 sets holding 5 sec with elbows on wall      Lumbar Exercises: Machines for Strengthening   Cybex Knee Extension 10 lbs bilat push with slow eccentrics 3X10    Cybex Knee Flexion 25 lbs bilat pull 2X15    Leg Press Lt leg only 31 lbs 3X15      Lumbar Exercises: Standing   Heel Raises Limitations heel toe rases 2 sets of 15    Other Standing Lumbar Exercises hip abd red band around ankles X 20 reps      Lumbar Exercises: Seated   Other Seated Lumbar Exercises sit to stand no UE support 2 sets of 10, slightly raised mat surface      Lumbar Exercises: Supine   Clam Limitations unilat green X 20 on both sides    Bridge 5 seconds;15 reps    Straight Leg Raises Limitations 20 reps bilat    Other Supine Lumbar Exercises Lt ankle 4 way 20 reps red band                    PT Short Term Goals -  10/13/20 0846      PT SHORT TERM GOAL #1   Title Pt will be I and compliant with HEP.    Time 4    Period Weeks    Status Achieved    Target Date 10/13/20      PT SHORT TERM GOAL #2   Title Pt will reduce overall back pain >25%    Baseline from 5 to 4 pain    Time 4    Period Weeks    Status On-going             PT Long Term Goals - 10/07/20 1322      PT LONG TERM GOAL #1   Title Pt will reduce overall pain >50%    Baseline still about 4-5 out of 10    Time 12    Period Weeks    Status On-going      PT LONG TERM GOAL #2   Title Patient will demonstrate independent use of home exercise program progression to facilitate ability to maintain/progress functional gains from skilled physical therapy services.    Baseline independent with current but will eventually need progression    Time 12    Period Weeks    Status On-going      PT LONG TERM GOAL #3   Title Pt will improve lumbar ROM to [facility]    Baseline 50-75%    Time 12    Period Weeks    Status On-going      PT LONG TERM GOAL #4   Title Pt will be able to ambulate  community distances without AD and return to normal ADLS    Baseline uses AD for community    Time 12    Period Weeks    Status On-going      PT LONG TERM GOAL #5   Title [name] 4+ throughout    Baseline 4 overall    Time 12    Period Weeks    Status On-going                 Plan - 10/13/20 0845    Clinical Impression Statement Continues to be limited by left leg weakness and radiculopathy but this appears to be slowly improving with PT. He will continue to benefit from PT to address these deficits.    Examination-Activity Limitations Bend;Dressing;Lift;Stand;Stairs;Squat;Sleep;Locomotion Level    Examination-Participation Restrictions Cleaning;Driving;Laundry;Yard Work;Shop    Stability/Clinical Decision Making Evolving/Moderate complexity    Rehab Potential Good    PT Frequency 2x / week    PT Duration 12 weeks   6-12 weeks   PT Treatment/Interventions ADLs/Self Care Home Management;Cryotherapy;Barrister's clerk;Therapeutic activities;Therapeutic exercise;Neuromuscular re-education;Manual techniques;Passive range of motion;Taping;Spinal Manipulations;Joint Manipulations;Dry needling    PT Next Visit Plan lumbar stretching, lumbar and Lt leg strengthening, Lt ankle strength, H.S. strength    PT Home Exercise Plan Access Code: [email or web address], added ankle 4 way    Consulted and Agree with Plan of Care Patient           Patient will benefit from skilled therapeutic intervention in order to improve the following deficits and impairments:  Abnormal gait, Decreased activity tolerance, Decreased balance, Decreased endurance, Decreased coordination, Decreased mobility, Decreased range of motion, Decreased strength, Difficulty walking, Increased muscle spasms, Pain, Improper body mechanics  Visit Diagnosis: Bilateral low back pain with left-sided sciatica, unspecified chronicity  Muscle weakness  (generalized)  Other abnormalities of gait and mobility  Localized edema  Problem List Patient Active Problem List   Diagnosis Date Noted  . ED (erectile dysfunction) 08/09/2012  . Hypercholesterolemia   . Chronic rhinitis   . Hyperlipidemia 02/29/2012    Debbe Odea, PT,DPT 10/13/2020, 8:55 AM  [facility] [address] [place], [place], [address] Phone: [phone]   Fax:  [phone]  Name: Jonathan Bridges MRN: [id number] Date of Birth: 11/20/1946

## 2020-10-15 DIAGNOSIS — Z6827 Body mass index (BMI) 27.0-27.9, adult: Secondary | ICD-10-CM | POA: Diagnosis not present

## 2020-10-15 DIAGNOSIS — I10 Essential (primary) hypertension: Secondary | ICD-10-CM | POA: Diagnosis not present

## 2020-10-15 DIAGNOSIS — M5416 Radiculopathy, lumbar region: Secondary | ICD-10-CM | POA: Diagnosis not present

## 2020-10-17 ENCOUNTER — Other Ambulatory Visit: Payer: Self-pay

## 2020-10-17 ENCOUNTER — Ambulatory Visit: Payer: Medicare PPO | Admitting: Physical Therapy

## 2020-10-17 DIAGNOSIS — R2689 Other abnormalities of gait and mobility: Secondary | ICD-10-CM | POA: Diagnosis not present

## 2020-10-17 DIAGNOSIS — M5442 Lumbago with sciatica, left side: Secondary | ICD-10-CM | POA: Diagnosis not present

## 2020-10-17 DIAGNOSIS — M6281 Muscle weakness (generalized): Secondary | ICD-10-CM | POA: Diagnosis not present

## 2020-10-17 DIAGNOSIS — R600 Localized edema: Secondary | ICD-10-CM

## 2020-10-17 NOTE — Therapy (Signed)
[place], [place], [address] Phone: [phone]   Fax:  [phone]  Physical Therapy Treatment  Patient Details  Name: Jonathan Bridges MRN: [id number] Date of Birth: [birth year] Referring Provider (PT): Judith Part, MD   Encounter Date: 10/17/2020   PT End of Session - 10/17/20 0841    Visit Number 8    Number of Visits 16    Date for PT Re-Evaluation 12/08/20    Authorization Type [employer], 12 viists authorized until 12/27    Authorization - Visit Number 8    Authorization - Number of Visits 12    PT Start Time 0804    PT Stop Time 0848    PT Time Calculation (min) 44 min    Activity Tolerance Patient tolerated treatment well    Behavior During Therapy [facility] for tasks assessed/performed           Past Medical History:  Diagnosis Date  . Anxiety   . Basal cell carcinoma   . Chronic rhinitis   . Clotting disorder (HCC)    right leg   . Depression   . Diverticulosis   . Elevated LFTs   . History of hypertension   . Hypercholesterolemia   . Sleep apnea     Past Surgical History:  Procedure Laterality Date  . COLONOSCOPY    . WRIST SURGERY  01/24/2003   Right- ORIF: Dr. Amedeo Plenty    There were no vitals filed for this visit.   Subjective Assessment - 10/17/20 0818    Subjective he relays a little improvment in back pain pain and leg symptoms. He can now don his compression garment. He relays he was a little too sore last visit.    Pertinent History lumbar surgery 07/09/20    Pain Onset More than a month ago              [facility] Adult PT Treatment/Exercise - 10/17/20 0001      Neuro Re-ed    Neuro Re-ed Details  march walking with one fingertip support, tandem balance       Lumbar Exercises: Stretches   Piriformis Stretch Left;3 reps;30 seconds    Piriformis Stretch Limitations seated    Gastroc Stretch Right;Left;3 reps;30 seconds    Gastroc Stretch Limitations slantboard    Other Lumbar  Stretch Exercise lumbar extension 10 reps, holding 5 sec with elbows on wall      Lumbar Exercises: Aerobic   UBE (Upper Arm Bike) UE/LE 5 min L5      Lumbar Exercises: Machines for Strengthening   Cybex Knee Extension 10 lbs bilat push with slow eccentrics 2X10    Cybex Knee Flexion 25 lbs bilat pull 2X10    Leg Press Lt leg only 31 lbs 3X10      Lumbar Exercises: Standing   Heel Raises Limitations heel toe raises 10 reps ea    Other Standing Lumbar Exercises hip abd red band around ankles X 20 reps    Other Standing Lumbar Exercises step ups 4 inch step Lt leg 10 reps, intermitt UE support PRN      Lumbar Exercises: Seated   Other Seated Lumbar Exercises ankle 4 way with red band X 20 ea                    PT Short Term Goals - 10/13/20 0846      PT SHORT TERM GOAL #1   Title Pt will be I and compliant with  HEP.    Time 4    Period Weeks    Status Achieved    Target Date 10/13/20      PT SHORT TERM GOAL #2   Title Pt will reduce overall back pain >25%    Baseline from 5 to 4 pain    Time 4    Period Weeks    Status On-going             PT Long Term Goals - 10/07/20 1322      PT LONG TERM GOAL #1   Title Pt will reduce overall pain >50%    Baseline still about 4-5 out of 10    Time 12    Period Weeks    Status On-going      PT LONG TERM GOAL #2   Title Patient will demonstrate independent use of home exercise program progression to facilitate ability to maintain/progress functional gains from skilled physical therapy services.    Baseline independent with current but will eventually need progression    Time 12    Period Weeks    Status On-going      PT LONG TERM GOAL #3   Title Pt will improve lumbar ROM to [facility]    Baseline 50-75%    Time 12    Period Weeks    Status On-going      PT LONG TERM GOAL #4   Title Pt will be able to ambulate community distances without AD and return to normal ADLS    Baseline uses AD for community    Time 12     Period Weeks    Status On-going      PT LONG TERM GOAL #5   Title [name] 4+ throughout    Baseline 4 overall    Time 12    Period Weeks    Status On-going                 Plan - 10/17/20 0856    Clinical Impression Statement He had MD follow up and patient reports MD was not concerned with anything and everything appeared to be in normal range after surgery. PT continued to focus on strength, gait, and balance. Some of the strengthening was backed down today because he had complaints of too much soreness last visit.    Examination-Activity Limitations Bend;Dressing;Lift;Stand;Stairs;Squat;Sleep;Locomotion Level    Examination-Participation Restrictions Cleaning;Driving;Laundry;Yard Work;Shop    Stability/Clinical Decision Making Evolving/Moderate complexity    Rehab Potential Good    PT Frequency 2x / week    PT Duration 12 weeks   6-12 weeks   PT Treatment/Interventions ADLs/Self Care Home Management;Cryotherapy;Barrister's clerk;Therapeutic activities;Therapeutic exercise;Neuromuscular re-education;Manual techniques;Passive range of motion;Taping;Spinal Manipulations;Joint Manipulations;Dry needling    PT Next Visit Plan lumbar stretching, lumbar and Lt leg strengthening, Lt ankle strength, H.S. strength    PT Home Exercise Plan Access Code: [email or web address], added ankle 4 way    Consulted and Agree with Plan of Care Patient           Patient will benefit from skilled therapeutic intervention in order to improve the following deficits and impairments:  Abnormal gait, Decreased activity tolerance, Decreased balance, Decreased endurance, Decreased coordination, Decreased mobility, Decreased range of motion, Decreased strength, Difficulty walking, Increased muscle spasms, Pain, Improper body mechanics  Visit Diagnosis: Bilateral low back pain with left-sided sciatica, unspecified chronicity  Muscle  weakness (generalized)  Other abnormalities of gait and mobility  Localized edema  Problem List Patient Active Problem List   Diagnosis Date Noted  . ED (erectile dysfunction) 08/09/2012  . Hypercholesterolemia   . Chronic rhinitis   . Hyperlipidemia 02/29/2012    Debbe Odea ,PT,DPT 10/17/2020, 8:58 AM  [facility] Physical Therapy [address] [place], [place], [address] Phone: [phone]   Fax:  [phone]  Name: Jonathan Bridges MRN: [id number] Date of Birth: Aug 16, 1946

## 2020-10-20 ENCOUNTER — Ambulatory Visit (INDEPENDENT_AMBULATORY_CARE_PROVIDER_SITE_OTHER): Payer: Medicare PPO | Admitting: Physical Therapy

## 2020-10-20 ENCOUNTER — Encounter: Payer: Self-pay | Admitting: Physical Therapy

## 2020-10-20 ENCOUNTER — Other Ambulatory Visit: Payer: Self-pay

## 2020-10-20 DIAGNOSIS — M5442 Lumbago with sciatica, left side: Secondary | ICD-10-CM | POA: Diagnosis not present

## 2020-10-20 DIAGNOSIS — M6281 Muscle weakness (generalized): Secondary | ICD-10-CM | POA: Diagnosis not present

## 2020-10-20 DIAGNOSIS — R600 Localized edema: Secondary | ICD-10-CM | POA: Diagnosis not present

## 2020-10-20 DIAGNOSIS — R2689 Other abnormalities of gait and mobility: Secondary | ICD-10-CM | POA: Diagnosis not present

## 2020-10-20 NOTE — Therapy (Signed)
[facility] Physical Therapy [address] [place], [place], [address] Phone: [phone]   Fax:  [phone]  Physical Therapy Treatment/Progress note Progress Note reporting period 09/12/20 to 10/20/20  See below for objective and subjective measurements relating to patients progress with PT.   Patient Details  Name: Jonathan Bridges MRN: [id number] Date of Birth: 1946-09-17 Referring Provider (PT): Judith Part, MD   Encounter Date: 10/20/2020   PT End of Session - 10/20/20 0849    Visit Number 9    Number of Visits 16    Date for PT Re-Evaluation 12/08/20    Authorization Type [employer], 12 viists authorized until 12/27    Authorization - Visit Number 8    Authorization - Number of Visits 12    Progress Note Due on Visit 19    PT Start Time 0803    PT Stop Time 6761    PT Time Calculation (min) 44 min    Activity Tolerance Patient tolerated treatment well    Behavior During Therapy [facility] for tasks assessed/performed           Past Medical History:  Diagnosis Date  . Anxiety   . Basal cell carcinoma   . Chronic rhinitis   . Clotting disorder (HCC)    right leg   . Depression   . Diverticulosis   . Elevated LFTs   . History of hypertension   . Hypercholesterolemia   . Sleep apnea     Past Surgical History:  Procedure Laterality Date  . COLONOSCOPY    . WRIST SURGERY  01/24/2003   Right- ORIF: Dr. Amedeo Plenty    There were no vitals filed for this visit.   Subjective Assessment - 10/20/20 0809    Subjective he feels the soreness is getting better about 5-6 overall. He walks a lot more without the cane now.    Pertinent History lumbar surgery 07/09/20    Pain Onset More than a month ago              [facility] PT Assessment - 10/20/20 0001      Assessment   Medical Diagnosis lumbar radiculopathy, post op disc herniation operation    Referring Provider (PT) Judith Part, MD      AROM   Lumbar Flexion 75%    Lumbar Extension 75%      Lumbar - Right Side Bend 75%    Lumbar - Left Side Bend 75%      Strength   Overall Strength Comments tested in sitting    Left Hip Flexion 5/5    Left Hip ABduction 4+/5    Left Knee Flexion 4/5    Left Knee Extension 4+/5    Left Ankle Dorsiflexion 3+/5    Left Ankle Plantar Flexion 4+/5    Left Ankle Inversion 4/5    Left Ankle Eversion 3+/5                         [facility] Adult PT Treatment/Exercise - 10/20/20 0001      Neuro Re-ed    Neuro Re-ed Details  march walking and, tandem walking 3 round trips intermit UE support PRN      Lumbar Exercises: Stretches   Piriformis Stretch Left;3 reps;30 seconds    Piriformis Stretch Limitations seated    Gastroc Stretch Right;Left;3 reps;30 seconds    Gastroc Stretch Limitations slantboard    Other Lumbar Stretch Exercise lumbar extension 10 reps, holding 5 sec with elbows  on wall      Lumbar Exercises: Aerobic   UBE (Upper Arm Bike) --    Nustep L5 12 min UE/LE      Lumbar Exercises: Machines for Strengthening   Cybex Knee Extension 10 lbs bilat push with slow eccentrics 2X10    Cybex Knee Flexion 25 lbs bilat pull 2X10    Leg Press Lt leg only 37 lbs 3X10      Lumbar Exercises: Standing   Heel Raises Limitations heel toe raises 15 reps ea    Other Standing Lumbar Exercises --    Other Standing Lumbar Exercises step ups 6 inch step Lt leg 18 reps, intermitt UE support PRN      Lumbar Exercises: Seated   Other Seated Lumbar Exercises ankle 4 way with red band X 20 ea                    PT Short Term Goals - 10/20/20 0854      PT SHORT TERM GOAL #1   Title Pt will be I and compliant with HEP.    Time 4    Period Weeks    Status Achieved    Target Date 10/13/20      PT SHORT TERM GOAL #2   Title Pt will reduce overall back pain >25%    Baseline from 5 to 4 pain    Time 4    Period Weeks    Status On-going             PT Long Term Goals - 10/20/20 0854      PT LONG TERM GOAL #1    Title Pt will reduce overall pain >50%    Baseline still about 4-5 out of 10    Time 12    Period Weeks    Status On-going      PT LONG TERM GOAL #2   Title Patient will demonstrate independent use of home exercise program progression to facilitate ability to maintain/progress functional gains from skilled physical therapy services.    Baseline independent with current but will eventually need progression    Time 12    Period Weeks    Status On-going      PT LONG TERM GOAL #3   Title Pt will improve lumbar ROM to [facility]    Baseline 50-75%    Time 12    Period Weeks    Status On-going      PT LONG TERM GOAL #4   Title Pt will be able to ambulate community distances without AD and return to normal ADLS    Baseline uses AD for community only but not for limited community distance    Time 12    Period Weeks    Status On-going      PT LONG TERM GOAL #5   Title [name] 4+ throughout    Baseline 4 overall    Time 12    Period Weeks    Status On-going                 Plan - 10/20/20 5573    Clinical Impression Statement Progress note today reflects impromements in PT with his lumbar ROM, leg strength, and overall gait/balance. He now is not having to rely on single point cane for ambulation. He does still have deficits in all these areas and will continue to benefit from skilled PT.    Examination-Activity Limitations Bend;Dressing;Lift;Stand;Stairs;Squat;Sleep;Locomotion Level  Examination-Participation Restrictions Cleaning;Driving;Laundry;Yard Work;Shop    Stability/Clinical Decision Making Evolving/Moderate complexity    Rehab Potential Good    PT Frequency 2x / week    PT Duration 12 weeks   6-12 weeks   PT Treatment/Interventions ADLs/Self Care Home Management;Cryotherapy;Barrister's clerk;Therapeutic activities;Therapeutic exercise;Neuromuscular re-education;Manual techniques;Passive range  of motion;Taping;Spinal Manipulations;Joint Manipulations;Dry needling    PT Next Visit Plan lumbar stretching, lumbar and Lt leg strengthening, Lt ankle strength, H.S. strength    PT Home Exercise Plan Access Code: [email or web address], added ankle 4 way    Consulted and Agree with Plan of Care Patient           Patient will benefit from skilled therapeutic intervention in order to improve the following deficits and impairments:  Abnormal gait, Decreased activity tolerance, Decreased balance, Decreased endurance, Decreased coordination, Decreased mobility, Decreased range of motion, Decreased strength, Difficulty walking, Increased muscle spasms, Pain, Improper body mechanics  Visit Diagnosis: Bilateral low back pain with left-sided sciatica, unspecified chronicity  Muscle weakness (generalized)  Other abnormalities of gait and mobility  Localized edema     Problem List Patient Active Problem List   Diagnosis Date Noted  . ED (erectile dysfunction) 08/09/2012  . Hypercholesterolemia   . Chronic rhinitis   . Hyperlipidemia 02/29/2012    [name] 10/20/2020, 8:55 AM  [facility] [address] [place], [place], [address] Phone: [phone]   Fax:  [phone]  Name: Jonathan Bridges MRN: [id number] Date of Birth: 1946-04-26

## 2020-10-22 ENCOUNTER — Ambulatory Visit (INDEPENDENT_AMBULATORY_CARE_PROVIDER_SITE_OTHER): Payer: Medicare PPO | Admitting: Physical Therapy

## 2020-10-22 ENCOUNTER — Other Ambulatory Visit: Payer: Self-pay

## 2020-10-22 DIAGNOSIS — R2689 Other abnormalities of gait and mobility: Secondary | ICD-10-CM | POA: Diagnosis not present

## 2020-10-22 DIAGNOSIS — R600 Localized edema: Secondary | ICD-10-CM | POA: Diagnosis not present

## 2020-10-22 DIAGNOSIS — M5442 Lumbago with sciatica, left side: Secondary | ICD-10-CM

## 2020-10-22 DIAGNOSIS — M6281 Muscle weakness (generalized): Secondary | ICD-10-CM | POA: Diagnosis not present

## 2020-10-22 NOTE — Therapy (Signed)
[place], [place], [address] Phone: [phone]   Fax:  [phone]  Physical Therapy Treatment  Patient Details  Name: Jonathan Bridges MRN: [id number] Date of Birth: 04/10/1946 Referring Provider (PT): Judith Part, MD   Encounter Date: 10/22/2020   PT End of Session - 10/22/20 0834    Visit Number 10    Number of Visits 16    Date for PT Re-Evaluation 12/08/20    Authorization Type [employer], 12 viists authorized until 12/27    Authorization - Visit Number 10    Authorization - Number of Visits 12    Progress Note Due on Visit 51    PT Start Time 0803    PT Stop Time 3832    PT Time Calculation (min) 44 min    Activity Tolerance Patient tolerated treatment well    Behavior During Therapy [facility] for tasks assessed/performed           Past Medical History:  Diagnosis Date   Anxiety    Basal cell carcinoma    Chronic rhinitis    Clotting disorder ([place])    right leg    Depression    Diverticulosis    Elevated LFTs    History of hypertension    Hypercholesterolemia    Sleep apnea     Past Surgical History:  Procedure Laterality Date   COLONOSCOPY     WRIST SURGERY  01/24/2003   Right- ORIF: Dr. Amedeo Plenty    There were no vitals filed for this visit.   Subjective Assessment - 10/22/20 0810    Subjective he relays overall he is doing fine today, a little sore and ovrerall back pain, but not bad.    Pertinent History lumbar surgery 07/09/20    Pain Onset More than a month ago                             [facility] Adult PT Treatment/Exercise - 10/22/20 0001      Neuro Re-ed    Neuro Re-ed Details  march walking and, tandem walking and walking with head turns and head nods 3 round trips intermit UE support PRN      Lumbar Exercises: Stretches   Piriformis Stretch Left;3 reps;30 seconds    Piriformis Stretch Limitations seated    Gastroc Stretch Right;Left;3 reps;30 seconds      Gastroc Stretch Limitations slantboard    Other Lumbar Stretch Exercise lumbar extension 10 reps, holding 5 sec with elbows on wall      Lumbar Exercises: Aerobic   UBE (Upper Arm Bike) UE/LE 5 min L6, he relays at the end he does not like this machine      Lumbar Exercises: Machines for Strengthening   Cybex Knee Extension 15 lbs bilat push with slow eccentrics 3X10    Cybex Knee Flexion 25 lbs bilat pull 2X10    Leg Press Lt leg only 37 lbs 3X10      Lumbar Exercises: Standing   Heel Raises Limitations heel toe raises 2 sets of 10 ea    Other Standing Lumbar Exercises step ups 6 inch step Lt leg 20 reps, intermitt UE support PRN. Then lateral step up and over to side X 10 bilat      Lumbar Exercises: Seated   Other Seated Lumbar Exercises ankle 4 way with red band X 20 ea  PT Short Term Goals - 10/20/20 0854      PT SHORT TERM GOAL #1   Title Pt will be I and compliant with HEP.    Time 4    Period Weeks    Status Achieved    Target Date 10/13/20      PT SHORT TERM GOAL #2   Title Pt will reduce overall back pain >25%    Baseline from 5 to 4 pain    Time 4    Period Weeks    Status On-going             PT Long Term Goals - 10/20/20 0854      PT LONG TERM GOAL #1   Title Pt will reduce overall pain >50%    Baseline still about 4-5 out of 10    Time 12    Period Weeks    Status On-going      PT LONG TERM GOAL #2   Title Patient will demonstrate independent use of home exercise program progression to facilitate ability to maintain/progress functional gains from skilled physical therapy services.    Baseline independent with current but will eventually need progression    Time 12    Period Weeks    Status On-going      PT LONG TERM GOAL #3   Title Pt will improve lumbar ROM to [facility]    Baseline 50-75%    Time 12    Period Weeks    Status On-going      PT LONG TERM GOAL #4   Title Pt will be able to ambulate community  distances without AD and return to normal ADLS    Baseline uses AD for community only but not for limited community distance    Time 12    Period Weeks    Status On-going      PT LONG TERM GOAL #5   Title [name] 4+ throughout    Baseline 4 overall    Time 12    Period Weeks    Status On-going                 Plan - 10/22/20 0851    Clinical Impression Statement He showed improvments in overall dynamic balance today but still lacks some balance and cordinaiton. PT will continue to progress this as able as welll as overall leg strength to his tolerance.    Examination-Activity Limitations Bend;Dressing;Lift;Stand;Stairs;Squat;Sleep;Locomotion Level    Examination-Participation Restrictions Cleaning;Driving;Laundry;Yard Work;Shop    Stability/Clinical Decision Making Evolving/Moderate complexity    Rehab Potential Good    PT Frequency 2x / week    PT Duration 12 weeks   6-12 weeks   PT Treatment/Interventions ADLs/Self Care Home Management;Cryotherapy;Barrister's clerk;Therapeutic activities;Therapeutic exercise;Neuromuscular re-education;Manual techniques;Passive range of motion;Taping;Spinal Manipulations;Joint Manipulations;Dry needling    PT Next Visit Plan lumbar stretching, lumbar and Lt leg strengthening, Lt ankle strength, H.S. strength    PT Home Exercise Plan Access Code: [email or web address], added ankle 4 way    Consulted and Agree with Plan of Care Patient           Patient will benefit from skilled therapeutic intervention in order to improve the following deficits and impairments:  Abnormal gait, Decreased activity tolerance, Decreased balance, Decreased endurance, Decreased coordination, Decreased mobility, Decreased range of motion, Decreased strength, Difficulty walking, Increased muscle spasms, Pain, Improper body mechanics  Visit Diagnosis: Bilateral low back pain with left-sided sciatica,  unspecified chronicity  Muscle weakness (  generalized)  Other abnormalities of gait and mobility  Localized edema     Problem List Patient Active Problem List   Diagnosis Date Noted   ED (erectile dysfunction) 08/09/2012   Hypercholesterolemia    Chronic rhinitis    Hyperlipidemia 02/29/2012    [name] 10/22/2020, 8:52 AM  [facility] Physical Therapy [address] [place], [place], [address] Phone: [phone]   Fax:  [phone]  Name: Jonathan Bridges MRN: [id number] Date of Birth: February 01, 1946

## 2020-10-27 ENCOUNTER — Other Ambulatory Visit: Payer: Self-pay

## 2020-10-27 ENCOUNTER — Ambulatory Visit: Payer: Medicare PPO | Admitting: Physical Therapy

## 2020-10-27 DIAGNOSIS — R600 Localized edema: Secondary | ICD-10-CM | POA: Diagnosis not present

## 2020-10-27 DIAGNOSIS — M5442 Lumbago with sciatica, left side: Secondary | ICD-10-CM

## 2020-10-27 DIAGNOSIS — M6281 Muscle weakness (generalized): Secondary | ICD-10-CM

## 2020-10-27 DIAGNOSIS — R2689 Other abnormalities of gait and mobility: Secondary | ICD-10-CM | POA: Diagnosis not present

## 2020-10-27 NOTE — Therapy (Signed)
[place], [place], [address] Phone: [phone]   Fax:  [phone]  Physical Therapy Treatment  Patient Details  Name: Jonathan Bridges MRN: [id number] Date of Birth: 1946/07/05 Referring Provider (PT): Judith Part, MD   Encounter Date: 10/27/2020   PT End of Session - 10/27/20 0841    Visit Number 11    Number of Visits 16    Date for PT Re-Evaluation 12/08/20    Authorization Type [employer], 12 viists authorized until 12/27    Authorization - Visit Number 11    Authorization - Number of Visits 12    Progress Note Due on Visit 19    PT Start Time 0803    PT Stop Time 0846    PT Time Calculation (min) 43 min    Activity Tolerance Patient tolerated treatment well    Behavior During Therapy [facility] for tasks assessed/performed           Past Medical History:  Diagnosis Date  . Anxiety   . Basal cell carcinoma   . Chronic rhinitis   . Clotting disorder ([facility])    right leg   . Depression   . Diverticulosis   . Elevated LFTs   . History of hypertension   . Hypercholesterolemia   . Sleep apnea     Past Surgical History:  Procedure Laterality Date  . COLONOSCOPY    . WRIST SURGERY  01/24/2003   Right- ORIF: Dr. Amedeo Plenty    There were no vitals filed for this visit.   Subjective Assessment - 10/27/20 0822    Subjective relays he is sore today from working out in the yard some. He relays he is no longer walking with the cane    Pertinent History lumbar surgery 07/09/20    Pain Onset More than a month ago             [facility] Adult PT Treatment/Exercise - 10/27/20 0001      Neuro Re-ed    Neuro Re-ed Details  march walking and, tandem walking and walking with head turns and head nods 3 round trips intermit UE support PRN all up/down X 3 reps, lateral walking on foam up/down X 5 reps with this      Lumbar Exercises: Stretches   Piriformis Stretch Left;3 reps;30 seconds    Piriformis Stretch Limitations  seated    Gastroc Stretch Right;Left;3 reps;30 seconds    Gastroc Stretch Limitations slantboard      Lumbar Exercises: Aerobic   Nustep L6 X 8 min UE/LE seat 11      Lumbar Exercises: Machines for Strengthening   Cybex Knee Extension 15 lbs bilat push with slow eccentrics 3X10    Cybex Knee Flexion 35 lbs bilat pull 2X15    Leg Press Lt leg only 43 lbs reps of 15, 12, 10      Lumbar Exercises: Standing   Heel Raises Limitations heel toe raises 2 sets of 10 ea    Other Standing Lumbar Exercises step ups 6 inch step Lt leg 15 reps, intermitt UE support PRN. Then lateral step up and over to side X 10 bilat      Lumbar Exercises: Seated   Other Seated Lumbar Exercises ankle 4 way with red band X 20 ea                    PT Short Term Goals - 10/20/20 0854      PT SHORT TERM GOAL #1  Title Pt will be I and compliant with HEP.    Time 4    Period Weeks    Status Achieved    Target Date 10/13/20      PT SHORT TERM GOAL #2   Title Pt will reduce overall back pain >25%    Baseline from 5 to 4 pain    Time 4    Period Weeks    Status On-going             PT Long Term Goals - 10/20/20 0854      PT LONG TERM GOAL #1   Title Pt will reduce overall pain >50%    Baseline still about 4-5 out of 10    Time 12    Period Weeks    Status On-going      PT LONG TERM GOAL #2   Title Patient will demonstrate independent use of home exercise program progression to facilitate ability to maintain/progress functional gains from skilled physical therapy services.    Baseline independent with current but will eventually need progression    Time 12    Period Weeks    Status On-going      PT LONG TERM GOAL #3   Title Pt will improve lumbar ROM to [facility]    Baseline 50-75%    Time 12    Period Weeks    Status On-going      PT LONG TERM GOAL #4   Title Pt will be able to ambulate community distances without AD and return to normal ADLS    Baseline uses AD for community only  but not for limited community distance    Time 12    Period Weeks    Status On-going      PT LONG TERM GOAL #5   Title [name] 4+ throughout    Baseline 4 overall    Time 12    Period Weeks    Status On-going                 Plan - 10/27/20 3220    Clinical Impression Statement He showed improvments in overall leg strength and was able to increase resistance and or reps today with good tolerance. He does relay that he feels its harder to balance after the strengthening because his legs are tired. PT will have him do balance exercises first next time.    Examination-Activity Limitations Bend;Dressing;Lift;Stand;Stairs;Squat;Sleep;Locomotion Level    Examination-Participation Restrictions Cleaning;Driving;Laundry;Yard Work;Shop    Stability/Clinical Decision Making Evolving/Moderate complexity    Rehab Potential Good    PT Frequency 2x / week    PT Duration 12 weeks   6-12 weeks   PT Treatment/Interventions ADLs/Self Care Home Management;Cryotherapy;Barrister's clerk;Therapeutic activities;Therapeutic exercise;Neuromuscular re-education;Manual techniques;Passive range of motion;Taping;Spinal Manipulations;Joint Manipulations;Dry needling    PT Next Visit Plan warm up then balance, then strengthening    PT Home Exercise Plan Access Code: [email or web address], added ankle 4 way    Consulted and Agree with Plan of Care Patient           Patient will benefit from skilled therapeutic intervention in order to improve the following deficits and impairments:  Abnormal gait, Decreased activity tolerance, Decreased balance, Decreased endurance, Decreased coordination, Decreased mobility, Decreased range of motion, Decreased strength, Difficulty walking, Increased muscle spasms, Pain, Improper body mechanics  Visit Diagnosis: Bilateral low back pain with left-sided sciatica, unspecified chronicity  Muscle weakness  (generalized)  Other abnormalities of gait and mobility  Localized edema     Problem List Patient Active Problem List   Diagnosis Date Noted  . ED (erectile dysfunction) 08/09/2012  . Hypercholesterolemia   . Chronic rhinitis   . Hyperlipidemia 02/29/2012    [name] 10/27/2020, 8:45 AM  [facility] Physical Therapy [address] [place], [place], [address] Phone: [phone]   Fax:  [phone]  Name: Jonathan Bridges MRN: [id number] Date of Birth: [birth year]

## 2020-10-29 ENCOUNTER — Ambulatory Visit: Payer: Medicare PPO | Admitting: Physical Therapy

## 2020-10-29 ENCOUNTER — Other Ambulatory Visit: Payer: Self-pay

## 2020-10-29 DIAGNOSIS — R2689 Other abnormalities of gait and mobility: Secondary | ICD-10-CM | POA: Diagnosis not present

## 2020-10-29 DIAGNOSIS — M6281 Muscle weakness (generalized): Secondary | ICD-10-CM

## 2020-10-29 DIAGNOSIS — M5442 Lumbago with sciatica, left side: Secondary | ICD-10-CM | POA: Diagnosis not present

## 2020-10-29 DIAGNOSIS — R600 Localized edema: Secondary | ICD-10-CM | POA: Diagnosis not present

## 2020-10-29 NOTE — Therapy (Signed)
[facility] Physical Therapy [address] [place], [place], [address] Phone: [phone]   Fax:  [phone]  Physical Therapy Treatment/Progress note/Recert Progress Note reporting period  10/20/20 to 10/29/20  See below for objective and subjective measurements relating to patients progress with PT.   Patient Details  Name: Jonathan Bridges MRN: [id number] Date of Birth: [birth year] Referring Provider (PT): Judith Part, MD   Encounter Date: 10/29/2020   PT End of Session - 10/29/20 0851    Visit Number 12    Number of Visits 24    Date for PT Re-Evaluation 01/21/21    Authorization Type [employer], 12 viists authorized until 12/27, resubmitted for more visis 11/17    Authorization - Visit Number 12    Authorization - Number of Visits 12    Progress Note Due on Visit 22    PT Start Time 0803    PT Stop Time 0845    PT Time Calculation (min) 42 min    Activity Tolerance Patient tolerated treatment well    Behavior During Therapy [facility] for tasks assessed/performed           Past Medical History:  Diagnosis Date  . Anxiety   . Basal cell carcinoma   . Chronic rhinitis   . Clotting disorder ([facility])    right leg   . Depression   . Diverticulosis   . Elevated LFTs   . History of hypertension   . Hypercholesterolemia   . Sleep apnea     Past Surgical History:  Procedure Laterality Date  . COLONOSCOPY    . WRIST SURGERY  01/24/2003   Right- ORIF: Dr. Amedeo Plenty    There were no vitals filed for this visit.   Subjective Assessment - 10/29/20 0813    Subjective relays he feels he is back to about 60% PLOF but still with numbness and weakness in his Lt leg affecting his walking.    Pertinent History lumbar surgery 07/09/20    Pain Onset More than a month ago              [facility] PT Assessment - 10/29/20 0001      Assessment   Medical Diagnosis lumbar radiculopathy, post op disc herniation operation    Referring Provider (PT) Judith Part,  MD    Onset Date/Surgical Date 07/09/20      Functional Tests   Functional tests Single leg stance      Single Leg Stance   Comments Lt leg 1 sec, Rt leg 30 sec      AROM   Lumbar Flexion 75%    Lumbar Extension 75%    Lumbar - Right Side Bend 75%    Lumbar - Left Side Bend 75%      Strength   Overall Strength Comments tested in sitting    Left Hip Flexion 5/5    Left Hip ABduction 4+/5    Left Knee Flexion 4+/5    Left Knee Extension 4+/5    Left Ankle Dorsiflexion 3+/5    Left Ankle Plantar Flexion 4+/5    Left Ankle Inversion 4/5    Left Ankle Eversion 4/5                         [facility] Adult PT Treatment/Exercise - 10/29/20 0001      Neuro Re-ed    Neuro Re-ed Details  SLS Lt leg 30 sec X 2 reps with one fingertip support, march walking and, tandem  walking and walking with head turns 3 round trips intermit UE support PRN all up/down X 3 reps, lateral stepping to 3 cones X 3 reps bilat       Lumbar Exercises: Stretches   Piriformis Stretch Left;3 reps;30 seconds    Piriformis Stretch Limitations seated    Gastroc Stretch Right;Left;3 reps;30 seconds    Gastroc Stretch Limitations slantboard    Other Lumbar Stretch Exercise lumbar extension 10 reps, holding 5 sec with elbows on wall      Lumbar Exercises: Aerobic   Nustep L6 X 8 min UE/LE seat 11      Lumbar Exercises: Machines for Strengthening   Leg Press Lt leg only 50 lbs 3 sets of 10      Lumbar Exercises: Standing   Heel Raises Limitations heel toe raises X 15 ea with UE suppport    Other Standing Lumbar Exercises step ups 6.5 inch step Lt leg 15 reps, without UE support and CGA                    PT Short Term Goals - 10/29/20 0855      PT SHORT TERM GOAL #1   Title Pt will be I and compliant with HEP.    Time 4    Period Weeks    Status Achieved    Target Date 10/13/20      PT SHORT TERM GOAL #2   Title Pt will reduce overall back pain >25%    Baseline more numbness than  pain now    Time 4    Period Weeks    Status Achieved             PT Long Term Goals - 10/29/20 0856      PT LONG TERM GOAL #1   Title Pt will reduce overall pain >50%    Baseline still about 4-5 out of 10    Time 12    Period Weeks    Status On-going      PT LONG TERM GOAL #2   Title Patient will demonstrate independent use of home exercise program progression to facilitate ability to maintain/progress functional gains from skilled physical therapy services.    Baseline progressed today    Time 12    Period Weeks    Status On-going      PT LONG TERM GOAL #3   Title Pt will improve lumbar ROM to [facility]    Baseline now met    Time 12    Period Weeks    Status Achieved      PT LONG TERM GOAL #4   Title Pt will be able to ambulate community distances without AD and return to normal ADLS    Baseline can amblate community distances now without AD but still not back to all his normal ADLs    Time 12    Period Weeks    Status On-going      PT LONG TERM GOAL #5   Title [name] 4+ throughout    Baseline still with Lt ankle weakness    Time 12    Period Weeks    Status On-going                 Plan - 10/29/20 7829    Clinical Impression Statement Today is visit 12 out of 12 initially authorized [facility] PT visits. Progress note and recertification performed today and he has made progress in overall dynamic  balance, lumbar ROM, and left leg strength although he still has significant deficits in these limiting his walking and ability to descend stairs. He will benefit from continued PT to adress his remaining deficits and PT recommending up to 12 more visits/weeks.    Examination-Activity Limitations Bend;Dressing;Lift;Stand;Stairs;Squat;Sleep;Locomotion Level    Examination-Participation Restrictions Cleaning;Driving;Laundry;Yard Work;Shop    Stability/Clinical Decision Making Evolving/Moderate complexity    Rehab Potential Good    PT  Frequency 2x / week    PT Duration 12 weeks   6-12 weeks   PT Treatment/Interventions ADLs/Self Care Home Management;Cryotherapy;Barrister's clerk;Therapeutic activities;Therapeutic exercise;Neuromuscular re-education;Manual techniques;Passive range of motion;Taping;Spinal Manipulations;Joint Manipulations;Dry needling    PT Next Visit Plan warm up then balance, then strengthening    PT Home Exercise Plan Access Code: [email or web address], added ankle 4 way    Consulted and Agree with Plan of Care Patient           Patient will benefit from skilled therapeutic intervention in order to improve the following deficits and impairments:  Abnormal gait, Decreased activity tolerance, Decreased balance, Decreased endurance, Decreased coordination, Decreased mobility, Decreased range of motion, Decreased strength, Difficulty walking, Increased muscle spasms, Pain, Improper body mechanics  Visit Diagnosis: Bilateral low back pain with left-sided sciatica, unspecified chronicity  Muscle weakness (generalized)  Other abnormalities of gait and mobility  Localized edema     Problem List Patient Active Problem List   Diagnosis Date Noted  . ED (erectile dysfunction) 08/09/2012  . Hypercholesterolemia   . Chronic rhinitis   . Hyperlipidemia 02/29/2012    [name] 10/29/2020, 8:58 AM  [facility] Physical Therapy [address] [place], [place], [address] Phone: [phone]   Fax:  [phone]  Name: Jonathan Bridges MRN: [id number] Date of Birth: [birth year]

## 2020-11-03 ENCOUNTER — Ambulatory Visit: Payer: Medicare PPO | Admitting: Physical Therapy

## 2020-11-03 ENCOUNTER — Other Ambulatory Visit: Payer: Self-pay

## 2020-11-03 DIAGNOSIS — M5442 Lumbago with sciatica, left side: Secondary | ICD-10-CM | POA: Diagnosis not present

## 2020-11-03 DIAGNOSIS — R600 Localized edema: Secondary | ICD-10-CM | POA: Diagnosis not present

## 2020-11-03 DIAGNOSIS — R2689 Other abnormalities of gait and mobility: Secondary | ICD-10-CM

## 2020-11-03 DIAGNOSIS — M6281 Muscle weakness (generalized): Secondary | ICD-10-CM | POA: Diagnosis not present

## 2020-11-03 NOTE — Therapy (Signed)
[facility] [address] [place], [place], [address] Phone: [phone]   Fax:  [phone]  Physical Therapy Treatment  Patient Details  Name: Jonathan Bridges MRN: [id number] Date of Birth: [birth year] Referring Provider (PT): Judith Part, MD   Encounter Date: 11/03/2020   PT End of Session - 11/03/20 0831    Visit Number 13    Number of Visits 24    Date for PT Re-Evaluation 01/21/21    Authorization Type 12 more visits authorized until 01/22/20    Authorization - Visit Number 1    Authorization - Number of Visits 12    Progress Note Due on Visit 34    PT Start Time 0803    PT Stop Time 0845    PT Time Calculation (min) 42 min    Activity Tolerance Patient tolerated treatment well    Behavior During Therapy [facility] for tasks assessed/performed           Past Medical History:  Diagnosis Date   Anxiety    Basal cell carcinoma    Chronic rhinitis    Clotting disorder ([place])    right leg    Depression    Diverticulosis    Elevated LFTs    History of hypertension    Hypercholesterolemia    Sleep apnea     Past Surgical History:  Procedure Laterality Date   COLONOSCOPY     WRIST SURGERY  01/24/2003   Right- ORIF: Dr. Amedeo Plenty    There were no vitals filed for this visit.   Subjective Assessment - 11/03/20 0810    Subjective relays he feels he is back to about 60% PLOF but still with numbness and weakness in his Lt leg affecting his walking.    Pertinent History lumbar surgery 07/09/20    Currently in Pain? Yes    Pain Score 6     Pain Location Back    Pain Descriptors / Indicators Aching;Numbness    Pain Type Surgical pain    Pain Onset More than a month ago             [facility] Adult PT Treatment/Exercise - 11/03/20 0001      Neuro Re-ed    Neuro Re-ed Details  SLS Lt leg 30 sec X 3 reps with one fingertip support,, tandem walking and walking with head turns 3 round trips intermit UE support PRN all up/down  X 3 reps, lateral stepping to 3 cones X 5 reps bilat       Exercises   Exercises --      Lumbar Exercises: Stretches   Piriformis Stretch Left;3 reps;30 seconds    Piriformis Stretch Limitations seated    Other Lumbar Stretch Exercise lumbar extension 10 reps, holding 5 sec with elbows on wall      Lumbar Exercises: Aerobic   Nustep L6 X 8 min UE/LE seat 12      Lumbar Exercises: Machines for Strengthening   Cybex Knee Extension 15 lbs bilat push with slow eccentrics 3X10    Cybex Knee Flexion 35 lbs bilat pull 2X15    Leg Press Lt leg only 50 lbs 3 sets of 10      Lumbar Exercises: Standing   Heel Raises Limitations heel toe raises X 20 ea with UE suppport    Functional Squats Limitations squats with UE support X 15    Other Standing Lumbar Exercises step ups 6.5 inch step Lt leg 15 reps, without UE support and CGA  PT Short Term Goals - 10/29/20 0855      PT SHORT TERM GOAL #1   Title Pt will be I and compliant with HEP.    Time 4    Period Weeks    Status Achieved    Target Date 10/13/20      PT SHORT TERM GOAL #2   Title Pt will reduce overall back pain >25%    Baseline more numbness than pain now    Time 4    Period Weeks    Status Achieved             PT Long Term Goals - 10/29/20 0856      PT LONG TERM GOAL #1   Title Pt will reduce overall pain >50%    Baseline still about 4-5 out of 10    Time 12    Period Weeks    Status On-going      PT LONG TERM GOAL #2   Title Patient will demonstrate independent use of home exercise program progression to facilitate ability to maintain/progress functional gains from skilled physical therapy services.    Baseline progressed today    Time 12    Period Weeks    Status On-going      PT LONG TERM GOAL #3   Title Pt will improve lumbar ROM to [facility]    Baseline now met    Time 12    Period Weeks    Status Achieved      PT LONG TERM GOAL #4   Title Pt will be able to ambulate  community distances without AD and return to normal ADLS    Baseline can amblate community distances now without AD but still not back to all his normal ADLs    Time 12    Period Weeks    Status On-going      PT LONG TERM GOAL #5   Title [name] 4+ throughout    Baseline still with Lt ankle weakness    Time 12    Period Weeks    Status On-going                 Plan - 11/03/20 0998    Clinical Impression Statement balance was a litte more inconsistent today with tendency to lose balance posteriorly requiring min to mod A at times. Continue with leg strength as able but he continues to have left leg weakness. Continue POC    Examination-Activity Limitations Bend;Dressing;Lift;Stand;Stairs;Squat;Sleep;Locomotion Level    Examination-Participation Restrictions Cleaning;Driving;Laundry;Yard Work;Shop    Stability/Clinical Decision Making Evolving/Moderate complexity    Rehab Potential Good    PT Frequency 2x / week    PT Duration 12 weeks   6-12 weeks   PT Treatment/Interventions ADLs/Self Care Home Management;Cryotherapy;Barrister's clerk;Therapeutic activities;Therapeutic exercise;Neuromuscular re-education;Manual techniques;Passive range of motion;Taping;Spinal Manipulations;Joint Manipulations;Dry needling    PT Next Visit Plan warm up then balance, then strengthening    PT Home Exercise Plan Access Code: [email or web address], added ankle 4 way    Consulted and Agree with Plan of Care Patient           Patient will benefit from skilled therapeutic intervention in order to improve the following deficits and impairments:  Abnormal gait, Decreased activity tolerance, Decreased balance, Decreased endurance, Decreased coordination, Decreased mobility, Decreased range of motion, Decreased strength, Difficulty walking, Increased muscle spasms, Pain, Improper body mechanics  Visit Diagnosis: Bilateral low back pain  with left-sided sciatica, unspecified chronicity  Muscle  weakness (generalized)  Other abnormalities of gait and mobility  Localized edema     Problem List Patient Active Problem List   Diagnosis Date Noted   ED (erectile dysfunction) 08/09/2012   Hypercholesterolemia    Chronic rhinitis    Hyperlipidemia 02/29/2012    [name] 11/03/2020, 8:45 AM  [facility] Physical Therapy [address] [place], [place], [address] Phone: [phone]   Fax:  [phone]  Name: JASIR ROTHER MRN: [id number] Date of Birth: 1946-06-23

## 2020-11-05 ENCOUNTER — Other Ambulatory Visit: Payer: Self-pay

## 2020-11-05 ENCOUNTER — Encounter: Payer: Self-pay | Admitting: Physical Therapy

## 2020-11-05 ENCOUNTER — Ambulatory Visit (INDEPENDENT_AMBULATORY_CARE_PROVIDER_SITE_OTHER): Payer: Medicare PPO | Admitting: Physical Therapy

## 2020-11-05 DIAGNOSIS — M5442 Lumbago with sciatica, left side: Secondary | ICD-10-CM | POA: Diagnosis not present

## 2020-11-05 DIAGNOSIS — R600 Localized edema: Secondary | ICD-10-CM

## 2020-11-05 DIAGNOSIS — R2689 Other abnormalities of gait and mobility: Secondary | ICD-10-CM | POA: Diagnosis not present

## 2020-11-05 DIAGNOSIS — M6281 Muscle weakness (generalized): Secondary | ICD-10-CM

## 2020-11-05 NOTE — Therapy (Signed)
[place], [place], [address] Phone: [phone]   Fax:  [phone]  Physical Therapy Treatment  Patient Details  Name: Jonathan Bridges MRN: [id number] Date of Birth: 1945/12/31 Referring Provider (PT): Judith Part, MD   Encounter Date: 11/05/2020   PT End of Session - 11/05/20 0840    Visit Number 14    Number of Visits 24    Date for PT Re-Evaluation 01/21/21    Authorization Type 12 more visits authorized until 01/22/20    Authorization - Visit Number 2    Authorization - Number of Visits 12    Progress Note Due on Visit 22    PT Start Time 0802    PT Stop Time 0845    PT Time Calculation (min) 43 min    Activity Tolerance Patient tolerated treatment well    Behavior During Therapy [facility] for tasks assessed/performed           Past Medical History:  Diagnosis Date  . Anxiety   . Basal cell carcinoma   . Chronic rhinitis   . Clotting disorder ([facility])    right leg   . Depression   . Diverticulosis   . Elevated LFTs   . History of hypertension   . Hypercholesterolemia   . Sleep apnea     Past Surgical History:  Procedure Laterality Date  . COLONOSCOPY    . WRIST SURGERY  01/24/2003   Right- ORIF: Dr. Amedeo Plenty    There were no vitals filed for this visit.   Subjective Assessment - 11/05/20 0829    Subjective relays pain is feeling some better but constantly has soreness and numbness in his Lt leg    Pertinent History lumbar surgery 07/09/20    Pain Onset More than a month ago                             [facility] Adult PT Treatment/Exercise - 11/05/20 0001      Neuro Re-ed    Neuro Re-ed Details  SLS Lt leg 30 sec X 3 reps with one fingertip support,, tandem walking and march walking 3 round trips intermit UE support PRN all up/down X 3 reps, SLS heel taps on trashcan that was turning over on its side X 10 reps standing on Lt      Lumbar Exercises: Stretches   Piriformis Stretch  Right;Left;3 reps;30 seconds    Piriformis Stretch Limitations seated    Gastroc Stretch Right;Left;3 reps;30 seconds    Gastroc Stretch Limitations slantboard    Other Lumbar Stretch Exercise lumbar extension 10 reps, holding 5 sec with elbows on wall      Lumbar Exercises: Aerobic   Nustep L6 X 8 min UE/LE seat 12      Lumbar Exercises: Machines for Strengthening   Cybex Knee Extension 15 lbs bilat push with slow eccentrics 3X10 (add more wt next visit)    Cybex Knee Flexion 35 lbs bilat pull 2X15    Leg Press Lt leg only 56 lbs 3 sets of 10, bilat legs 112 lbs 2 sets of 10      Lumbar Exercises: Standing   Heel Raises Limitations heel toe raises 2X15 ea with UE suppport    Functional Squats Limitations --    Other Standing Lumbar Exercises step ups 6.5 inch step Lt leg 15 reps, without UE support and CGA  PT Short Term Goals - 10/29/20 0855      PT SHORT TERM GOAL #1   Title Pt will be I and compliant with HEP.    Time 4    Period Weeks    Status Achieved    Target Date 10/13/20      PT SHORT TERM GOAL #2   Title Pt will reduce overall back pain >25%    Baseline more numbness than pain now    Time 4    Period Weeks    Status Achieved             PT Long Term Goals - 10/29/20 0856      PT LONG TERM GOAL #1   Title Pt will reduce overall pain >50%    Baseline still about 4-5 out of 10    Time 12    Period Weeks    Status On-going      PT LONG TERM GOAL #2   Title Patient will demonstrate independent use of home exercise program progression to facilitate ability to maintain/progress functional gains from skilled physical therapy services.    Baseline progressed today    Time 12    Period Weeks    Status On-going      PT LONG TERM GOAL #3   Title Pt will improve lumbar ROM to [facility]    Baseline now met    Time 12    Period Weeks    Status Achieved      PT LONG TERM GOAL #4   Title Pt will be able to ambulate community distances  without AD and return to normal ADLS    Baseline can amblate community distances now without AD but still not back to all his normal ADLs    Time 12    Period Weeks    Status On-going      PT LONG TERM GOAL #5   Title [name] 4+ throughout    Baseline still with Lt ankle weakness    Time 12    Period Weeks    Status On-going                 Plan - 11/05/20 0842    Clinical Impression Statement Better overall dyanmic balance but he continues to struggle with SLS balance. Left leg Strength overall progressing well. Continue POC    Examination-Activity Limitations Bend;Dressing;Lift;Stand;Stairs;Squat;Sleep;Locomotion Level    Examination-Participation Restrictions Cleaning;Driving;Laundry;Yard Work;Shop    Stability/Clinical Decision Making Evolving/Moderate complexity    Rehab Potential Good    PT Frequency 2x / week    PT Duration 12 weeks   6-12 weeks   PT Treatment/Interventions ADLs/Self Care Home Management;Cryotherapy;Barrister's clerk;Therapeutic activities;Therapeutic exercise;Neuromuscular re-education;Manual techniques;Passive range of motion;Taping;Spinal Manipulations;Joint Manipulations;Dry needling    PT Next Visit Plan warm up then balance, then strengthening    PT Home Exercise Plan Access Code: [email or web address], added ankle 4 way    Consulted and Agree with Plan of Care Patient           Patient will benefit from skilled therapeutic intervention in order to improve the following deficits and impairments:  Abnormal gait, Decreased activity tolerance, Decreased balance, Decreased endurance, Decreased coordination, Decreased mobility, Decreased range of motion, Decreased strength, Difficulty walking, Increased muscle spasms, Pain, Improper body mechanics  Visit Diagnosis: Bilateral low back pain with left-sided sciatica, unspecified chronicity  Muscle weakness (generalized)  Other  abnormalities of gait and mobility  Localized edema  Problem List Patient Active Problem List   Diagnosis Date Noted  . ED (erectile dysfunction) 08/09/2012  . Hypercholesterolemia   . Chronic rhinitis   . Hyperlipidemia 02/29/2012    [name] 11/05/2020, 8:45 AM  [facility] Physical Therapy [address] [place], [place], [address] Phone: [phone]   Fax:  [phone]  Name: IBRAHIM MCPHEETERS MRN: [id number] Date of Birth: 1946-09-03

## 2020-11-11 ENCOUNTER — Encounter: Payer: Medicare PPO | Admitting: Physical Therapy

## 2020-11-13 ENCOUNTER — Other Ambulatory Visit: Payer: Self-pay

## 2020-11-13 ENCOUNTER — Ambulatory Visit: Payer: Medicare PPO | Admitting: Physical Therapy

## 2020-11-13 DIAGNOSIS — M6281 Muscle weakness (generalized): Secondary | ICD-10-CM

## 2020-11-13 DIAGNOSIS — M5442 Lumbago with sciatica, left side: Secondary | ICD-10-CM | POA: Diagnosis not present

## 2020-11-13 DIAGNOSIS — R600 Localized edema: Secondary | ICD-10-CM

## 2020-11-13 DIAGNOSIS — R2689 Other abnormalities of gait and mobility: Secondary | ICD-10-CM

## 2020-11-13 NOTE — Therapy (Signed)
[place], [place], [address] Phone: [phone]   Fax:  [phone]  Physical Therapy Treatment  Patient Details  Name: Jonathan Bridges MRN: [id number] Date of Birth: 1946-07-07 Referring Provider (PT): Judith Part, MD   Encounter Date: 11/13/2020   PT End of Session - 11/13/20 0942    Visit Number 15    Number of Visits 24    Date for PT Re-Evaluation 01/21/21    Authorization Type 12 more visits authorized until 01/22/20    Authorization - Visit Number 3    Authorization - Number of Visits 12    Progress Note Due on Visit 22    PT Start Time 0845    PT Stop Time 0930    PT Time Calculation (min) 45 min    Activity Tolerance Patient tolerated treatment well    Behavior During Therapy [facility] for tasks assessed/performed           Past Medical History:  Diagnosis Date  . Anxiety   . Basal cell carcinoma   . Chronic rhinitis   . Clotting disorder ([facility])    right leg   . Depression   . Diverticulosis   . Elevated LFTs   . History of hypertension   . Hypercholesterolemia   . Sleep apnea     Past Surgical History:  Procedure Laterality Date  . COLONOSCOPY    . WRIST SURGERY  01/24/2003   Right- ORIF: Dr. Amedeo Plenty    There were no vitals filed for this visit.   Subjective Assessment - 11/13/20 0941    Subjective relays some soreness in his left leg after traveling for holidays, continues to have numbness down his left leg and into his big toe    Pertinent History lumbar surgery 07/09/20    Pain Onset More than a month ago           [facility] Adult PT Treatment/Exercise - 11/13/20 0001      Neuro Re-ed    Neuro Re-ed Details  SLS Lt leg 30 sec X 3 reps with one fingertip support,, on foam for tandem balance, feet together with head turns, feet apart with eyes closed      Lumbar Exercises: Stretches   Piriformis Stretch Right;Left;3 reps;30 seconds    Piriformis Stretch Limitations seated    Other  Lumbar Stretch Exercise lumbar extension 10 reps, holding 5 sec with elbows on wall      Lumbar Exercises: Aerobic   Recumbent Bike seat 8 8 min L3      Lumbar Exercises: Machines for Strengthening   Cybex Knee Extension 20 lbs 2X15 bilat psuh    Cybex Knee Flexion 35 lbs bilat pull 2X15    Leg Press Lt leg only 56 lbs 2sets of 10, bilat legs 118 lbs 3 sets of 10      Lumbar Exercises: Standing   Heel Raises Limitations heel toe raises 2X15 ea with UE suppport    Other Standing Lumbar Exercises step ups fwd and latearal 6 inch step Lt leg 15 reps, without UE support and CGA                    PT Short Term Goals - 10/29/20 0855      PT SHORT TERM GOAL #1   Title Pt will be I and compliant with HEP.    Time 4    Period Weeks    Status Achieved    Target Date 10/13/20  PT SHORT TERM GOAL #2   Title Pt will reduce overall back pain >25%    Baseline more numbness than pain now    Time 4    Period Weeks    Status Achieved             PT Long Term Goals - 10/29/20 0856      PT LONG TERM GOAL #1   Title Pt will reduce overall pain >50%    Baseline still about 4-5 out of 10    Time 12    Period Weeks    Status On-going      PT LONG TERM GOAL #2   Title Patient will demonstrate independent use of home exercise program progression to facilitate ability to maintain/progress functional gains from skilled physical therapy services.    Baseline progressed today    Time 12    Period Weeks    Status On-going      PT LONG TERM GOAL #3   Title Pt will improve lumbar ROM to [facility]    Baseline now met    Time 12    Period Weeks    Status Achieved      PT LONG TERM GOAL #4   Title Pt will be able to ambulate community distances without AD and return to normal ADLS    Baseline can amblate community distances now without AD but still not back to all his normal ADLs    Time 12    Period Weeks    Status On-going      PT LONG TERM GOAL #5   Title Patient will  demonstrate Ltt LE MMT 4+ throughout    Baseline still with Lt ankle weakness    Time 12    Period Weeks    Status On-going                 Plan - 11/13/20 0953    Clinical Impression Statement continued to work on his deficits in balance, Lt leg strength, and coordination. He had good tolerance to session and was able to progess his resistance some today with strenghtening.    Examination-Activity Limitations Bend;Dressing;Lift;Stand;Stairs;Squat;Sleep;Locomotion Level    Examination-Participation Restrictions Cleaning;Driving;Laundry;Yard Work;Shop    Stability/Clinical Decision Making Evolving/Moderate complexity    Rehab Potential Good    PT Frequency 2x / week    PT Duration 12 weeks   6-12 weeks   PT Treatment/Interventions ADLs/Self Care Home Management;Cryotherapy;Barrister's clerk;Therapeutic activities;Therapeutic exercise;Neuromuscular re-education;Manual techniques;Passive range of motion;Taping;Spinal Manipulations;Joint Manipulations;Dry needling    PT Next Visit Plan warm up then balance, then strengthening    PT Home Exercise Plan Access Code: [email or web address], added ankle 4 way    Consulted and Agree with Plan of Care Patient           Patient will benefit from skilled therapeutic intervention in order to improve the following deficits and impairments:  Abnormal gait, Decreased activity tolerance, Decreased balance, Decreased endurance, Decreased coordination, Decreased mobility, Decreased range of motion, Decreased strength, Difficulty walking, Increased muscle spasms, Pain, Improper body mechanics  Visit Diagnosis: Bilateral low back pain with left-sided sciatica, unspecified chronicity  Muscle weakness (generalized)  Other abnormalities of gait and mobility  Localized edema     Problem List Patient Active Problem List   Diagnosis Date Noted  . ED (erectile dysfunction) 08/09/2012  . Hypercholesterolemia     . Chronic rhinitis   . Hyperlipidemia 02/29/2012    [name] 11/13/2020, 9:55 AM  [facility]  Physical Therapy [address] [place], [place], [address] Phone: [phone]   Fax:  [phone]  Name: Jonathan Bridges MRN: [id number] Date of Birth: 09-20-1946

## 2020-11-17 ENCOUNTER — Other Ambulatory Visit: Payer: Self-pay

## 2020-11-17 ENCOUNTER — Ambulatory Visit: Payer: Medicare PPO | Admitting: Physical Therapy

## 2020-11-17 DIAGNOSIS — M6281 Muscle weakness (generalized): Secondary | ICD-10-CM

## 2020-11-17 DIAGNOSIS — R600 Localized edema: Secondary | ICD-10-CM | POA: Diagnosis not present

## 2020-11-17 DIAGNOSIS — M5442 Lumbago with sciatica, left side: Secondary | ICD-10-CM | POA: Diagnosis not present

## 2020-11-17 DIAGNOSIS — R2689 Other abnormalities of gait and mobility: Secondary | ICD-10-CM

## 2020-11-17 NOTE — Therapy (Signed)
Jonathan Bridges, [place], [address] Phone: [phone]   Fax:  [phone]  Physical Therapy Treatment  Patient Details  Name: Jonathan Bridges MRN: [id number] Date of Birth: 02-Oct-1946 Referring Provider (PT): Jonathan Part, MD   Encounter Date: 11/17/2020   PT End of Session - 11/17/20 0845    Visit Number 16    Number of Visits 24    Date for PT Re-Evaluation 01/21/21    Authorization Type 12 more visits authorized until 01/22/20    Authorization - Visit Number 4    Authorization - Number of Visits 12    Progress Note Due on Visit 22    PT Start Time 0801    PT Stop Time 0848    PT Time Calculation (min) 47 min    Activity Tolerance Patient tolerated treatment well    Behavior During Therapy Jonathan Bridges for tasks assessed/performed           Past Medical History:  Diagnosis Date  . Anxiety   . Basal cell carcinoma   . Chronic rhinitis   . Clotting disorder (HCC)    right leg   . Depression   . Diverticulosis   . Elevated LFTs   . History of hypertension   . Hypercholesterolemia   . Sleep apnea     Past Surgical History:  Procedure Laterality Date  . COLONOSCOPY    . WRIST SURGERY  01/24/2003   Right- ORIF: Dr. Amedeo Plenty    There were no vitals filed for this visit.   Subjective Assessment - 11/17/20 0818    Subjective he relays his left knee just does not feel stable and like it wants to buckle on him so he is trying out knee brace to wear    Pertinent History lumbar surgery 07/09/20    Currently in Pain? Yes    Pain Score 4     Pain Location Back   and hips   Pain Onset More than a month ago             [facility] Adult PT Treatment/Exercise - 11/17/20 0001      Neuro Re-ed    Neuro Re-ed Details  SLS Lt leg 30 sec X 3 reps with one fingertip support, tandem walk, SLS cone taps, lateral stepping to cones 3 directions      Lumbar Exercises: Stretches   Piriformis Stretch Right;Left;3 reps;30 seconds     Piriformis Stretch Limitations seated    Other Lumbar Stretch Exercise seated lumbar flexion pball roll outs 10 sec X 10 reps    Other Lumbar Stretch Exercise lumbar extension 10 reps, holding 5 sec with elbows on wall      Lumbar Exercises: Aerobic   Recumbent Bike seat 8 8 min L3      Lumbar Exercises: Machines for Strengthening   Cybex Knee Extension 25 lbs 3X10 bilat push    Cybex Knee Flexion 45 lbs bilat pull 2X15    Leg Press Lt leg only 56 lbs 3sets of 10, bilat legs 125 lbs 2 sets of 10      Lumbar Exercises: Standing   Heel Raises Limitations heel toe raises 2X15 ea with UE suppport      Lumbar Exercises: Seated   Sit to Stand Limitations no UE support 2X10 with Lt leg behind 21.5 inch                    PT Short Term Goals - 10/29/20 6599  PT SHORT TERM GOAL #1   Title Pt will be I and compliant with HEP.    Time 4    Period Weeks    Status Achieved    Target Date 10/13/20      PT SHORT TERM GOAL #2   Title Pt will reduce overall back pain >25%    Baseline more numbness than pain now    Time 4    Period Weeks    Status Achieved             PT Long Term Goals - 10/29/20 0856      PT LONG TERM GOAL #1   Title Pt will reduce overall pain >50%    Baseline still about 4-5 out of 10    Time 12    Period Weeks    Status On-going      PT LONG TERM GOAL #2   Title Patient will demonstrate independent use of home exercise program progression to facilitate ability to maintain/progress functional gains from skilled physical therapy services.    Baseline progressed today    Time 12    Period Weeks    Status On-going      PT LONG TERM GOAL #3   Title Pt will improve lumbar ROM to [facility]    Baseline now met    Time 12    Period Weeks    Status Achieved      PT LONG TERM GOAL #4   Title Pt will be able to ambulate community distances without AD and return to normal ADLS    Baseline can amblate community distances now without AD but still not  back to all his normal ADLs    Time 12    Period Weeks    Status On-going      PT LONG TERM GOAL #5   Title [name] 4+ throughout    Baseline still with Lt ankle weakness    Time 12    Period Weeks    Status On-going                 Plan - 11/17/20 0847    Clinical Impression Statement He showed some improvments in SLS balance today but he does still lack Lt quad control and has genu recurvatum at times. He is trying out knee brace for this and PT willl continue to work on strengthening and stability for his Lt leg.    Examination-Activity Limitations Bend;Dressing;Lift;Stand;Stairs;Squat;Sleep;Locomotion Level    Examination-Participation Restrictions Cleaning;Driving;Laundry;Yard Work;Shop    Stability/Clinical Decision Making Evolving/Moderate complexity    Rehab Potential Good    PT Frequency 2x / week    PT Duration 12 weeks   6-12 weeks   PT Treatment/Interventions ADLs/Self Care Home Management;Cryotherapy;Barrister's clerk;Therapeutic activities;Therapeutic exercise;Neuromuscular re-education;Manual techniques;Passive range of motion;Taping;Spinal Manipulations;Joint Manipulations;Dry needling    PT Next Visit Plan warm up then balance, then strengthening    PT Home Exercise Plan Access Code: [email or web address], added ankle 4 way    Consulted and Agree with Plan of Care Patient           Patient will benefit from skilled therapeutic intervention in order to improve the following deficits and impairments:  Abnormal gait, Decreased activity tolerance, Decreased balance, Decreased endurance, Decreased coordination, Decreased mobility, Decreased range of motion, Decreased strength, Difficulty walking, Increased muscle spasms, Pain, Improper body mechanics  Visit Diagnosis: Bilateral low back pain with left-sided sciatica, unspecified chronicity  Muscle weakness (generalized)  Other abnormalities  of  gait and mobility  Localized edema     Problem List Patient Active Problem List   Diagnosis Date Noted  . ED (erectile dysfunction) 08/09/2012  . Hypercholesterolemia   . Chronic rhinitis   . Hyperlipidemia 02/29/2012    Jonathan Bridges 11/17/2020, 8:49 AM  [facility] [address] [place], [place], [address] Phone: [phone]   Fax:  [phone]  Name: Jonathan Bridges MRN: [id number] Date of Birth: May 25, 1946

## 2020-11-20 ENCOUNTER — Encounter: Payer: Medicare PPO | Admitting: Family Medicine

## 2020-11-21 ENCOUNTER — Other Ambulatory Visit: Payer: Self-pay

## 2020-11-21 ENCOUNTER — Ambulatory Visit: Payer: Medicare PPO | Admitting: Physical Therapy

## 2020-11-21 DIAGNOSIS — R2689 Other abnormalities of gait and mobility: Secondary | ICD-10-CM | POA: Diagnosis not present

## 2020-11-21 DIAGNOSIS — R600 Localized edema: Secondary | ICD-10-CM

## 2020-11-21 DIAGNOSIS — M6281 Muscle weakness (generalized): Secondary | ICD-10-CM | POA: Diagnosis not present

## 2020-11-21 DIAGNOSIS — M5442 Lumbago with sciatica, left side: Secondary | ICD-10-CM | POA: Diagnosis not present

## 2020-11-21 NOTE — Therapy (Signed)
[place], [place], [address] Phone: [phone]   Fax:  [phone]  Physical Therapy Treatment  Patient Details  Name: Jonathan Bridges MRN: [id number] Date of Birth: 02/26/1946 Referring Provider (PT): Judith Part, MD   Encounter Date: 11/21/2020   PT End of Session - 11/21/20 0819    Visit Number 17    Number of Visits 24    Date for PT Re-Evaluation 01/21/21    Authorization Type use KX, 12 more visits authorized until 01/22/20    Authorization - Visit Number 5    Authorization - Number of Visits 12    Progress Note Due on Visit 22    PT Start Time 0801    PT Stop Time 0845    PT Time Calculation (min) 44 min    Activity Tolerance Patient tolerated treatment well    Behavior During Therapy [facility] for tasks assessed/performed           Past Medical History:  Diagnosis Date  . Anxiety   . Basal cell carcinoma   . Chronic rhinitis   . Clotting disorder (HCC)    right leg   . Depression   . Diverticulosis   . Elevated LFTs   . History of hypertension   . Hypercholesterolemia   . Sleep apnea     Past Surgical History:  Procedure Laterality Date  . COLONOSCOPY    . WRIST SURGERY  01/24/2003   Right- ORIF: Dr. Amedeo Plenty    There were no vitals filed for this visit.   Subjective Assessment - 11/21/20 0816    Subjective He relays he feels like he has had a breakthrough this week, he can now go down the stairs, and has overall less pain from a 6 out of 10 down to about 3 out of 10 now.    Pertinent History lumbar surgery 07/09/20    Pain Onset More than a month ago              [facility] PT Assessment - 11/21/20 0001      Assessment   Medical Diagnosis lumbar radiculopathy, post op disc herniation operation    Referring Provider (PT) Judith Part, MD    Onset Date/Surgical Date 07/09/20      Functional Tests   Functional tests Single leg stance      Single Leg Stance   Comments Lt leg 3  sec avg, Rt leg 30 sec      AROM   Lumbar Flexion WNL    Lumbar Extension 75%    Lumbar - Right Side Bend 75%    Lumbar - Left Side Bend 75%                         [facility] Adult PT Treatment/Exercise - 11/21/20 0001      Neuro Re-ed    Neuro Re-ed Details  SLS Lt leg 30 sec X 3 reps with one fingertip support, tandem walk, SLS cone taps, lateral stepping to cones 3 directions      Lumbar Exercises: Stretches   Piriformis Stretch Right;Left;4 reps;20 seconds    Piriformis Stretch Limitations seated    Other Lumbar Stretch Exercise seated lumbar flexion pball roll outs 10 sec X 10 reps    Other Lumbar Stretch Exercise lumbar extension 10 reps, holding 5 sec with elbows on wall      Lumbar Exercises: Aerobic   Recumbent Bike seat 8 8 min L3  Lumbar Exercises: Machines for Strengthening   Cybex Knee Extension 25 lbs 3X10 bilat push, then left leg only 10 lbs for 10, 5, 5 reps    Cybex Knee Flexion 45 lbs bilat pull 2X15    Leg Press Lt leg only 62 lbs 2sets of 10, then bilat legs 131 lbs 2 sets of 10      Lumbar Exercises: Standing   Heel Raises Limitations heel toe raises 2X10 ea  holding 5 sec with UE suppport                    PT Short Term Goals - 10/29/20 0855      PT SHORT TERM GOAL #1   Title Pt will be I and compliant with HEP.    Time 4    Period Weeks    Status Achieved    Target Date 10/13/20      PT SHORT TERM GOAL #2   Title Pt will reduce overall back pain >25%    Baseline more numbness than pain now    Time 4    Period Weeks    Status Achieved             PT Long Term Goals - 10/29/20 0856      PT LONG TERM GOAL #1   Title Pt will reduce overall pain >50%    Baseline still about 4-5 out of 10    Time 12    Period Weeks    Status On-going      PT LONG TERM GOAL #2   Title Patient will demonstrate independent use of home exercise program progression to facilitate ability to maintain/progress functional gains from  skilled physical therapy services.    Baseline progressed today    Time 12    Period Weeks    Status On-going      PT LONG TERM GOAL #3   Title Pt will improve lumbar ROM to [facility]    Baseline now met    Time 12    Period Weeks    Status Achieved      PT LONG TERM GOAL #4   Title Pt will be able to ambulate community distances without AD and return to normal ADLS    Baseline can amblate community distances now without AD but still not back to all his normal ADLs    Time 12    Period Weeks    Status On-going      PT LONG TERM GOAL #5   Title [name] 4+ throughout    Baseline still with Lt ankle weakness    Time 12    Period Weeks    Status On-going                 Plan - 11/21/20 0844    Clinical Impression Statement SLS has improved from one sec avg to now 3 sec avg. He was again able to progress resistance today with weight machines and is making progress with strength but still limited. Continue POC    Examination-Activity Limitations Bend;Dressing;Lift;Stand;Stairs;Squat;Sleep;Locomotion Level    Examination-Participation Restrictions Cleaning;Driving;Laundry;Yard Work;Shop    Stability/Clinical Decision Making Evolving/Moderate complexity    Rehab Potential Good    PT Frequency 2x / week    PT Duration 12 weeks   6-12 weeks   PT Treatment/Interventions ADLs/Self Care Home Management;Cryotherapy;Barrister's clerk;Therapeutic activities;Therapeutic exercise;Neuromuscular re-education;Manual techniques;Passive range of motion;Taping;Spinal Manipulations;Joint Manipulations;Dry needling    PT Next Visit Plan  warm up then balance, then strengthening    PT Home Exercise Plan Access Code: [email or web address], added ankle 4 way    Consulted and Agree with Plan of Care Patient           Patient will benefit from skilled therapeutic intervention in order to improve the following deficits and impairments:   Abnormal gait,Decreased activity tolerance,Decreased balance,Decreased endurance,Decreased coordination,Decreased mobility,Decreased range of motion,Decreased strength,Difficulty walking,Increased muscle spasms,Pain,Improper body mechanics  Visit Diagnosis: Bilateral low back pain with left-sided sciatica, unspecified chronicity  Muscle weakness (generalized)  Other abnormalities of gait and mobility  Localized edema     Problem List Patient Active Problem List   Diagnosis Date Noted  . ED (erectile dysfunction) 08/09/2012  . Hypercholesterolemia   . Chronic rhinitis   . Hyperlipidemia 02/29/2012    Debbe Odea, PT,DPT 11/21/2020, 8:47 AM  [facility] [address] [place], [place], [address] Phone: [phone]   Fax:  [phone]  Name: Jonathan Bridges MRN: [id number] Date of Birth: August 17, 1946

## 2020-11-24 ENCOUNTER — Encounter: Payer: Self-pay | Admitting: Physical Therapy

## 2020-11-24 ENCOUNTER — Ambulatory Visit: Payer: Medicare PPO | Admitting: Physical Therapy

## 2020-11-24 ENCOUNTER — Other Ambulatory Visit: Payer: Self-pay

## 2020-11-24 DIAGNOSIS — R2689 Other abnormalities of gait and mobility: Secondary | ICD-10-CM

## 2020-11-24 DIAGNOSIS — M5442 Lumbago with sciatica, left side: Secondary | ICD-10-CM | POA: Diagnosis not present

## 2020-11-24 DIAGNOSIS — M6281 Muscle weakness (generalized): Secondary | ICD-10-CM | POA: Diagnosis not present

## 2020-11-24 DIAGNOSIS — R600 Localized edema: Secondary | ICD-10-CM

## 2020-11-24 NOTE — Therapy (Signed)
[place], [place], [address] Phone: [phone]   Fax:  [phone]  Physical Therapy Treatment  Patient Details  Name: Jonathan Bridges MRN: [id number] Date of Birth: 08-May-1946 Referring Provider (PT): Jonathan Part, MD   Encounter Date: 11/24/2020   PT End of Session - 11/24/20 0846    Visit Number 18    Number of Visits 24    Date for PT Re-Evaluation 01/21/21    Authorization Type use KX, 12 more visits authorized until 01/22/20    Authorization - Visit Number 6    Authorization - Number of Visits 12    Progress Note Due on Visit 22    PT Start Time 0803    PT Stop Time 0846    PT Time Calculation (min) 43 min    Activity Tolerance Patient tolerated treatment well    Behavior During Therapy [facility] for tasks assessed/performed           Past Medical History:  Diagnosis Date  . Anxiety   . Basal cell carcinoma   . Chronic rhinitis   . Clotting disorder (HCC)    right leg   . Depression   . Diverticulosis   . Elevated LFTs   . History of hypertension   . Hypercholesterolemia   . Sleep apnea     Past Surgical History:  Procedure Laterality Date  . COLONOSCOPY    . WRIST SURGERY  01/24/2003   Right- ORIF: Dr. Amedeo Bridges    There were no vitals filed for this visit.   Subjective Assessment - 11/24/20 0812    Subjective He relays it is difficult to get out of bed due to lumbar/leg stiffness but this improves with exercise.    Pertinent History lumbar surgery 07/09/20    Pain Onset More than a month ago                             [facility] Adult PT Treatment/Exercise - 11/24/20 0001      Neuro Re-ed    Neuro Re-ed Details  SLS Lt leg 30 sec X 4 reps with one fingertip support, tandem walk X 5 trips, braided walking X 3 trips      Lumbar Exercises: Stretches   Piriformis Stretch Right;Left;3 reps;30 seconds    Other Lumbar Stretch Exercise seated lumbar flexion pball roll outs 10 sec  X 10 reps    Other Lumbar Stretch Exercise lumbar extension 15 reps, holding 3 sec      Lumbar Exercises: Aerobic   Recumbent Bike seat 8 8 min L3      Lumbar Exercises: Machines for Strengthening   Cybex Knee Extension 35 lbs 2X10 bilat push, then left leg only 10 lbs 2 sets of 10    Cybex Knee Flexion 45 lbs bilat pull 2X15    Leg Press Lt leg only 62 lbs 2sets of 10, then bilat legs 131 lbs 2 sets of 10      Lumbar Exercises: Standing   Heel Raises Limitations heel toe raises X15 ea  holding 5 sec with UE suppport    Other Standing Lumbar Exercises step ups 8.5 inch curb step no UE support Lt leg X 15 reps    Other Standing Lumbar Exercises TRX rows and squats 2 sets of 10 ea                    PT Short Term Goals -  10/29/20 0855      PT SHORT TERM GOAL #1   Title Pt will be I and compliant with HEP.    Time 4    Period Weeks    Status Achieved    Target Date 10/13/20      PT SHORT TERM GOAL #2   Title Pt will reduce overall back pain >25%    Baseline more numbness than pain now    Time 4    Period Weeks    Status Achieved             PT Long Term Goals - 10/29/20 0856      PT LONG TERM GOAL #1   Title Pt will reduce overall pain >50%    Baseline still about 4-5 out of 10    Time 12    Period Weeks    Status On-going      PT LONG TERM GOAL #2   Title Patient will demonstrate independent use of home exercise program progression to facilitate ability to maintain/progress functional gains from skilled physical therapy services.    Baseline progressed today    Time 12    Period Weeks    Status On-going      PT LONG TERM GOAL #3   Title Pt will improve lumbar ROM to [facility]    Baseline now met    Time 12    Period Weeks    Status Achieved      PT LONG TERM GOAL #4   Title Pt will be able to ambulate community distances without AD and return to normal ADLS    Baseline can amblate community distances now without AD but still not back to all his normal  ADLs    Time 12    Period Weeks    Status On-going      PT LONG TERM GOAL #5   Title [name] 4+ throughout    Baseline still with Lt ankle weakness    Time 12    Period Weeks    Status On-going                 Plan - 11/24/20 0848    Clinical Impression Statement Continued to work on balance and left leg strength. Added braided walking today and he was able to do this with close supercision. He was again able to progress strengthening today with good tolerance. Continue POC    Examination-Activity Limitations Bend;Dressing;Lift;Stand;Stairs;Squat;Sleep;Locomotion Level    Examination-Participation Restrictions Cleaning;Driving;Laundry;Yard Work;Shop    Stability/Clinical Decision Making Evolving/Moderate complexity    Rehab Potential Good    PT Frequency 2x / week    PT Duration 12 weeks   6-12 weeks   PT Treatment/Interventions ADLs/Self Care Home Management;Cryotherapy;Barrister's clerk;Therapeutic activities;Therapeutic exercise;Neuromuscular re-education;Manual techniques;Passive range of motion;Taping;Spinal Manipulations;Joint Manipulations;Dry needling    PT Next Visit Plan warm up then balance, then strengthening    PT Home Exercise Plan Access Code: [email or web address], added ankle 4 way    Consulted and Agree with Plan of Care Patient           Patient will benefit from skilled therapeutic intervention in order to improve the following deficits and impairments:  Abnormal gait,Decreased activity tolerance,Decreased balance,Decreased endurance,Decreased coordination,Decreased mobility,Decreased range of motion,Decreased strength,Difficulty walking,Increased muscle spasms,Pain,Improper body mechanics  Visit Diagnosis: Bilateral low back pain with left-sided sciatica, unspecified chronicity  Muscle weakness (generalized)  Other abnormalities of gait and mobility  Localized edema     Problem  List Patient Active Problem List   Diagnosis Date Noted  . ED (erectile dysfunction) 08/09/2012  . Hypercholesterolemia   . Chronic rhinitis   . Hyperlipidemia 02/29/2012    Jonathan Bridges 11/24/2020, 8:50 AM  [facility] [address] [place], [place], [address] Phone: [phone]   Fax:  [phone]  Name: Jonathan Bridges MRN: [id number] Date of Birth: 11-17-1946

## 2020-11-27 ENCOUNTER — Other Ambulatory Visit: Payer: Self-pay

## 2020-11-27 ENCOUNTER — Ambulatory Visit: Payer: Medicare PPO | Admitting: Physical Therapy

## 2020-11-27 DIAGNOSIS — R600 Localized edema: Secondary | ICD-10-CM | POA: Diagnosis not present

## 2020-11-27 DIAGNOSIS — R2689 Other abnormalities of gait and mobility: Secondary | ICD-10-CM | POA: Diagnosis not present

## 2020-11-27 DIAGNOSIS — M6281 Muscle weakness (generalized): Secondary | ICD-10-CM

## 2020-11-27 DIAGNOSIS — M5442 Lumbago with sciatica, left side: Secondary | ICD-10-CM | POA: Diagnosis not present

## 2020-11-27 NOTE — Therapy (Signed)
[place], [place], [address] Phone: [phone]   Fax:  [phone]  Physical Therapy Treatment  Patient Details  Name: Jonathan Bridges MRN: [id number] Date of Birth: Feb 21, 1946 Referring Provider (PT): Judith Part, MD   Encounter Date: 11/27/2020   PT End of Session - 11/27/20 0938    Visit Number 19    Number of Visits 24    Date for PT Re-Evaluation 01/21/21    Authorization Type use KX, 12 more visits authorized until 01/22/20    Authorization - Visit Number 7    Authorization - Number of Visits 12    Progress Note Due on Visit 22    PT Start Time 0859    PT Stop Time 0930    PT Time Calculation (min) 31 min    Activity Tolerance Patient tolerated treatment well    Behavior During Therapy [facility] for tasks assessed/performed           Past Medical History:  Diagnosis Date  . Anxiety   . Basal cell carcinoma   . Chronic rhinitis   . Clotting disorder (HCC)    right leg   . Depression   . Diverticulosis   . Elevated LFTs   . History of hypertension   . Hypercholesterolemia   . Sleep apnea     Past Surgical History:  Procedure Laterality Date  . COLONOSCOPY    . WRIST SURGERY  01/24/2003   Right- ORIF: Dr. Amedeo Plenty    There were no vitals filed for this visit.   Subjective Assessment - 11/27/20 0938    Subjective He relays he thought his appt was at 9 so he arrives late today by mistake. Other than that he feels he is progressing, not much pain overall today..    Pertinent History lumbar surgery 07/09/20    Pain Onset More than a month ago                             [facility] Adult PT Treatment/Exercise - 11/27/20 0001      Neuro Re-ed    Neuro Re-ed Details  SLS Lt leg 30 sec X 4 reps with one fingertip, rocker board no UE support  A-P 2 min and lateral 2 min      Lumbar Exercises: Stretches   Other Lumbar Stretch Exercise standing L stretch for flexion 10 sec X 5    Other  Lumbar Stretch Exercise lumbar extension 15 reps, holding 3 sec      Lumbar Exercises: Aerobic   Recumbent Bike 5 min L3      Lumbar Exercises: Machines for Strengthening   Cybex Knee Extension 35 lbs 2X10 bilat push, then left leg only 10 lbs 2 sets of 10    Leg Press Lt leg only 68 lbs for 10, then 62 lbs for 15      Lumbar Exercises: Standing   Other Standing Lumbar Exercises step ups 8.5 inch curb step no UE support Lt leg 2 X 10reps    Other Standing Lumbar Exercises TRX rows and squats 2 sets of 10 ea                    PT Short Term Goals - 10/29/20 0855      PT SHORT TERM GOAL #1   Title Pt will be I and compliant with HEP.    Time 4    Period Weeks  Status Achieved    Target Date 10/13/20      PT SHORT TERM GOAL #2   Title Pt will reduce overall back pain >25%    Baseline more numbness than pain now    Time 4    Period Weeks    Status Achieved             PT Long Term Goals - 10/29/20 0856      PT LONG TERM GOAL #1   Title Pt will reduce overall pain >50%    Baseline still about 4-5 out of 10    Time 12    Period Weeks    Status On-going      PT LONG TERM GOAL #2   Title Patient will demonstrate independent use of home exercise program progression to facilitate ability to maintain/progress functional gains from skilled physical therapy services.    Baseline progressed today    Time 12    Period Weeks    Status On-going      PT LONG TERM GOAL #3   Title Pt will improve lumbar ROM to [facility]    Baseline now met    Time 12    Period Weeks    Status Achieved      PT LONG TERM GOAL #4   Title Pt will be able to ambulate community distances without AD and return to normal ADLS    Baseline can amblate community distances now without AD but still not back to all his normal ADLs    Time 12    Period Weeks    Status On-going      PT LONG TERM GOAL #5   Title [name] 4+ throughout    Baseline still with Lt ankle  weakness    Time 12    Period Weeks    Status On-going                 Plan - 11/27/20 0939    Clinical Impression Statement He has been steadily making progress with Lt leg strength but still with deficits in this and overalll balance. PT will work to continue to improve this as able.    Examination-Activity Limitations Bend;Dressing;Lift;Stand;Stairs;Squat;Sleep;Locomotion Level    Examination-Participation Restrictions Cleaning;Driving;Laundry;Yard Work;Shop    Stability/Clinical Decision Making Evolving/Moderate complexity    Rehab Potential Good    PT Frequency 2x / week    PT Duration 12 weeks   6-12 weeks   PT Treatment/Interventions ADLs/Self Care Home Management;Cryotherapy;Barrister's clerk;Therapeutic activities;Therapeutic exercise;Neuromuscular re-education;Manual techniques;Passive range of motion;Taping;Spinal Manipulations;Joint Manipulations;Dry needling    PT Next Visit Plan warm up then balance, then strengthening    PT Home Exercise Plan Access Code: [email or web address], added ankle 4 way    Consulted and Agree with Plan of Care Patient           Patient will benefit from skilled therapeutic intervention in order to improve the following deficits and impairments:  Abnormal gait,Decreased activity tolerance,Decreased balance,Decreased endurance,Decreased coordination,Decreased mobility,Decreased range of motion,Decreased strength,Difficulty walking,Increased muscle spasms,Pain,Improper body mechanics  Visit Diagnosis: Bilateral low back pain with left-sided sciatica, unspecified chronicity  Muscle weakness (generalized)  Other abnormalities of gait and mobility  Localized edema     Problem List Patient Active Problem List   Diagnosis Date Noted  . ED (erectile dysfunction) 08/09/2012  . Hypercholesterolemia   . Chronic rhinitis   . Hyperlipidemia 02/29/2012    [name] 11/27/2020, 9:41  AM  [facility] [address] [place], [place], [address] Phone: [phone]   Fax:  [phone]  Name: Jonathan Bridges MRN: [id number] Date of Birth: 07/13/1946

## 2020-12-01 ENCOUNTER — Other Ambulatory Visit: Payer: Self-pay

## 2020-12-01 ENCOUNTER — Ambulatory Visit: Payer: Medicare PPO | Admitting: Physical Therapy

## 2020-12-01 DIAGNOSIS — M6281 Muscle weakness (generalized): Secondary | ICD-10-CM | POA: Diagnosis not present

## 2020-12-01 DIAGNOSIS — R600 Localized edema: Secondary | ICD-10-CM

## 2020-12-01 DIAGNOSIS — R2689 Other abnormalities of gait and mobility: Secondary | ICD-10-CM | POA: Diagnosis not present

## 2020-12-01 DIAGNOSIS — M5442 Lumbago with sciatica, left side: Secondary | ICD-10-CM | POA: Diagnosis not present

## 2020-12-01 NOTE — Therapy (Signed)
[place], [place], [address] Phone: [phone]   Fax:  [phone]  Physical Therapy Treatment  Patient Details  Name: Jonathan Bridges MRN: [id number] Date of Birth: [birth year] Referring Provider (PT): Judith Part, MD   Encounter Date: 12/01/2020   PT End of Session - 12/01/20 0852    Visit Number 20    Number of Visits 24    Date for PT Re-Evaluation 01/21/21    Authorization Type use KX, 12 more visits authorized until 01/22/20    Authorization - Visit Number 8    Authorization - Number of Visits 12    Progress Note Due on Visit 22    PT Start Time 0833    PT Stop Time 0918    PT Time Calculation (min) 45 min    Activity Tolerance Patient tolerated treatment well    Behavior During Therapy [facility] for tasks assessed/performed           Past Medical History:  Diagnosis Date  . Anxiety   . Basal cell carcinoma   . Chronic rhinitis   . Clotting disorder (HCC)    right leg   . Depression   . Diverticulosis   . Elevated LFTs   . History of hypertension   . Hypercholesterolemia   . Sleep apnea     Past Surgical History:  Procedure Laterality Date  . COLONOSCOPY    . WRIST SURGERY  01/24/2003   Right- ORIF: Dr. Amedeo Plenty    There were no vitals filed for this visit.   Subjective Assessment - 12/01/20 0851    Subjective He relays the overall pain is about 3 today in his back/left leg    Pertinent History lumbar surgery 07/09/20    Pain Onset More than a month ago              [facility] PT Assessment - 12/01/20 0001      Single Leg Stance   Comments Lt leg 3 sec avg, Rt leg 30 sec            [facility] Adult PT Treatment/Exercise - 12/01/20 0001      Neuro Re-ed    Neuro Re-ed Details  SLS Lt leg 30 sec X 3 reps with one fingertip, foam beam walk 5 trips, tandem beam walk 5 trips, bosu balance feet apart 2 min      Lumbar Exercises: Stretches   Other Lumbar Stretch Exercise standing L stretch for  flexion 10 sec X 5    Other Lumbar Stretch Exercise lumbar extension 10 reps, holding 3 sec      Lumbar Exercises: Machines for Strengthening   Cybex Knee Extension 35 lbs 2X10 bilat push, then left leg only 15 lbs 2 sets of 10    Cybex Knee Flexion 45 lbs bilat pull 2X15    Leg Press Lt leg only 68 lbs for 2 sets of 10, then 62 lbs for 10      Lumbar Exercises: Standing   Heel Raises Limitations heel toe raises X20 ea  holding 2-3 sec with UE suppport    Other Standing Lumbar Exercises step ups 8.5 inch curb step no UE support Lt leg X15      Lumbar Exercises: Seated   Other Seated Lumbar Exercises ankle 4 way with red band X 20 ea                    PT Short Term Goals - 10/29/20 5366  PT SHORT TERM GOAL #1   Title Pt will be I and compliant with HEP.    Time 4    Period Weeks    Status Achieved    Target Date 10/13/20      PT SHORT TERM GOAL #2   Title Pt will reduce overall back pain >25%    Baseline more numbness than pain now    Time 4    Period Weeks    Status Achieved             PT Long Term Goals - 10/29/20 0856      PT LONG TERM GOAL #1   Title Pt will reduce overall pain >50%    Baseline still about 4-5 out of 10    Time 12    Period Weeks    Status On-going      PT LONG TERM GOAL #2   Title Patient will demonstrate independent use of home exercise program progression to facilitate ability to maintain/progress functional gains from skilled physical therapy services.    Baseline progressed today    Time 12    Period Weeks    Status On-going      PT LONG TERM GOAL #3   Title Pt will improve lumbar ROM to [facility]    Baseline now met    Time 12    Period Weeks    Status Achieved      PT LONG TERM GOAL #4   Title Pt will be able to ambulate community distances without AD and return to normal ADLS    Baseline can amblate community distances now without AD but still not back to all his normal ADLs    Time 12    Period Weeks    Status  On-going      PT LONG TERM GOAL #5   Title [name] 4+ throughout    Baseline still with Lt ankle weakness    Time 12    Period Weeks    Status On-going                 Plan - 12/01/20 5102    Clinical Impression Statement Was again able to progress strengthening for Lt leg and is overall improving with quad control. He does still lack Lt ankle strength/stability along with overall balance which was also addressed during sesison.    Examination-Activity Limitations Bend;Dressing;Lift;Stand;Stairs;Squat;Sleep;Locomotion Level    Examination-Participation Restrictions Cleaning;Driving;Laundry;Yard Work;Shop    Stability/Clinical Decision Making Evolving/Moderate complexity    Rehab Potential Good    PT Frequency 2x / week    PT Duration 12 weeks   6-12 weeks   PT Treatment/Interventions ADLs/Self Care Home Management;Cryotherapy;Barrister's clerk;Therapeutic activities;Therapeutic exercise;Neuromuscular re-education;Manual techniques;Passive range of motion;Taping;Spinal Manipulations;Joint Manipulations;Dry needling    PT Next Visit Plan warm up then balance, then strengthening    PT Home Exercise Plan Access Code: [email or web address], added ankle 4 way    Consulted and Agree with Plan of Care Patient           Patient will benefit from skilled therapeutic intervention in order to improve the following deficits and impairments:  Abnormal gait,Decreased activity tolerance,Decreased balance,Decreased endurance,Decreased coordination,Decreased mobility,Decreased range of motion,Decreased strength,Difficulty walking,Increased muscle spasms,Pain,Improper body mechanics  Visit Diagnosis: Bilateral low back pain with left-sided sciatica, unspecified chronicity  Muscle weakness (generalized)  Other abnormalities of gait and mobility  Localized edema     Problem List Patient Active Problem List   Diagnosis  Date  Noted  . ED (erectile dysfunction) 08/09/2012  . Hypercholesterolemia   . Chronic rhinitis   . Hyperlipidemia 02/29/2012    [name] 12/01/2020, 9:28 AM  [facility] Physical Therapy [address] [place], [place], [address] Phone: [phone]   Fax:  [phone]  Name: Jonathan Bridges MRN: [id number] Date of Birth: Mar 03, 1946

## 2020-12-23 ENCOUNTER — Other Ambulatory Visit: Payer: Self-pay

## 2020-12-23 ENCOUNTER — Ambulatory Visit: Payer: Medicare PPO | Admitting: Physical Therapy

## 2020-12-23 ENCOUNTER — Encounter: Payer: Self-pay | Admitting: Physical Therapy

## 2020-12-23 DIAGNOSIS — M6281 Muscle weakness (generalized): Secondary | ICD-10-CM

## 2020-12-23 DIAGNOSIS — R2689 Other abnormalities of gait and mobility: Secondary | ICD-10-CM

## 2020-12-23 DIAGNOSIS — R600 Localized edema: Secondary | ICD-10-CM | POA: Diagnosis not present

## 2020-12-23 DIAGNOSIS — M5442 Lumbago with sciatica, left side: Secondary | ICD-10-CM | POA: Diagnosis not present

## 2020-12-23 NOTE — Therapy (Signed)
[place], [place], [address] Phone: [phone]   Fax:  [phone]  Physical Therapy Treatment  Patient Details  Name: Jonathan Bridges MRN: [id number] Date of Birth: 1946-11-04 Referring Provider (PT): Judith Part, MD   Encounter Date: 12/23/2020   PT End of Session - 12/23/20 1004    Visit Number 21    Number of Visits 24    Date for PT Re-Evaluation 01/21/21    Authorization Type use KX, 12 more visits authorized until 01/22/20    Authorization - Visit Number 9    Authorization - Number of Visits 12    Progress Note Due on Visit 22    PT Start Time 0845    PT Stop Time 0933    PT Time Calculation (min) 48 min    Activity Tolerance Patient tolerated treatment well    Behavior During Therapy [facility] for tasks assessed/performed           Past Medical History:  Diagnosis Date  . Anxiety   . Basal cell carcinoma   . Chronic rhinitis   . Clotting disorder (HCC)    right leg   . Depression   . Diverticulosis   . Elevated LFTs   . History of hypertension   . Hypercholesterolemia   . Sleep apnea     Past Surgical History:  Procedure Laterality Date  . COLONOSCOPY    . WRIST SURGERY  01/24/2003   Right- ORIF: Dr. Amedeo Plenty    There were no vitals filed for this visit.   Subjective Assessment - 12/23/20 0904    Subjective Pt reports pain is a 3 today, swelling in L ankle has decreased slightly. Pt. saw Dr. on the 6th, reported nothing concerning and is on track with recovery.    Pertinent History lumbar surgery 07/09/20    Pain Onset More than a month ago              [facility] PT Assessment - 12/23/20 0001      Strength   Right/Left Knee Right    Right/Left Ankle Right    Right Ankle Dorsiflexion 5/5    Right Ankle Plantar Flexion 5/5    Right Ankle Inversion 5/5    Right Ankle Eversion 5/5    Left Ankle Dorsiflexion 3/5    Left Ankle Plantar Flexion 4-/5    Left Ankle Inversion 3-/5    Left  Ankle Eversion 3-/5                         [facility] Adult PT Treatment/Exercise - 12/23/20 0001      Neuro Re-ed    Neuro Re-ed Details  SLS Lt leg 30 sec X 3 reps with one fingertip, foam beam walk 5 trips, tandem beam walk 5 trips, bosu balance feet apart 3 min      Lumbar Exercises: Aerobic   Recumbent Bike 8 min L3      Lumbar Exercises: Machines for Strengthening   Cybex Knee Extension 35 lbs 3X10 bilat push    Cybex Knee Flexion --    Leg Press Lt leg only 68 lbs for 2 sets of 10, then 62 lbs for 10      Lumbar Exercises: Supine   Other Supine Lumbar Exercises Lt ankle 4 way 20 reps red band      Lumbar Exercises: Sidelying   Other Sidelying Lumbar Exercises Ankle eversion 20x  PT Short Term Goals - 10/29/20 0855      PT SHORT TERM GOAL #1   Title Pt will be I and compliant with HEP.    Time 4    Period Weeks    Status Achieved    Target Date 10/13/20      PT SHORT TERM GOAL #2   Title Pt will reduce overall back pain >25%    Baseline more numbness than pain now    Time 4    Period Weeks    Status Achieved             PT Long Term Goals - 10/29/20 0856      PT LONG TERM GOAL #1   Title Pt will reduce overall pain >50%    Baseline still about 4-5 out of 10    Time 12    Period Weeks    Status On-going      PT LONG TERM GOAL #2   Title Patient will demonstrate independent use of home exercise program progression to facilitate ability to maintain/progress functional gains from skilled physical therapy services.    Baseline progressed today    Time 12    Period Weeks    Status On-going      PT LONG TERM GOAL #3   Title Pt will improve lumbar ROM to [facility]    Baseline now met    Time 12    Period Weeks    Status Achieved      PT LONG TERM GOAL #4   Title Pt will be able to ambulate community distances without AD and return to normal ADLS    Baseline can amblate community distances now without AD but still not  back to all his normal ADLs    Time 12    Period Weeks    Status On-going      PT LONG TERM GOAL #5   Title [name] 4+ throughout    Baseline still with Lt ankle weakness    Time 12    Period Weeks    Status On-going                 Plan - 12/23/20 1009    Clinical Impression Statement Pt tolerated tx well today, retested ankle strength today and added additional SL exercise with cues for HEP to progress ankle strength and increase stability.    Examination-Activity Limitations Bend;Dressing;Lift;Stand;Stairs;Squat;Sleep;Locomotion Level    Examination-Participation Restrictions Cleaning;Driving;Laundry;Yard Work;Shop    Stability/Clinical Decision Making Evolving/Moderate complexity    Rehab Potential Good    PT Frequency 2x / week    PT Duration 12 weeks   6-12 weeks   PT Treatment/Interventions ADLs/Self Care Home Management;Cryotherapy;Barrister's clerk;Therapeutic activities;Therapeutic exercise;Neuromuscular re-education;Manual techniques;Passive range of motion;Taping;Spinal Manipulations;Joint Manipulations;Dry needling;Balance training    PT Next Visit Plan needs progress note this visit. check in with HEP, continue balance progressions and integrate strength exercises into functional activities    PT Home Exercise Plan Access Code: [email or web address], added ankle 4 way    Consulted and Agree with Plan of Care Patient           Patient will benefit from skilled therapeutic intervention in order to improve the following deficits and impairments:  Abnormal gait,Decreased activity tolerance,Decreased balance,Decreased endurance,Decreased coordination,Decreased mobility,Decreased range of motion,Decreased strength,Difficulty walking,Increased muscle spasms,Pain,Improper body mechanics  Visit Diagnosis: Bilateral low back pain with left-sided sciatica, unspecified chronicity  Muscle weakness  (generalized)  Other abnormalities of gait and  mobility  Localized edema     Problem List Patient Active Problem List   Diagnosis Date Noted  . ED (erectile dysfunction) 08/09/2012  . Hypercholesterolemia   . Chronic rhinitis   . Hyperlipidemia 02/29/2012    Rhetta Mura, DPT 12/23/2020, 10:20 AM  [facility] Physical Therapy [address] [place], [place], [address] Phone: [phone]   Fax:  [phone]  Name: KEEGAN DUCEY MRN: [id number] Date of Birth: [birth year]

## 2020-12-25 ENCOUNTER — Encounter: Payer: Self-pay | Admitting: Physical Therapy

## 2020-12-25 ENCOUNTER — Other Ambulatory Visit: Payer: Self-pay

## 2020-12-25 ENCOUNTER — Ambulatory Visit: Payer: Medicare PPO | Admitting: Physical Therapy

## 2020-12-25 DIAGNOSIS — M6281 Muscle weakness (generalized): Secondary | ICD-10-CM

## 2020-12-25 DIAGNOSIS — R600 Localized edema: Secondary | ICD-10-CM

## 2020-12-25 DIAGNOSIS — R2689 Other abnormalities of gait and mobility: Secondary | ICD-10-CM

## 2020-12-25 DIAGNOSIS — M5442 Lumbago with sciatica, left side: Secondary | ICD-10-CM

## 2020-12-25 NOTE — Therapy (Signed)
[facility] Therapy [address] [place], [place], [address] Phone: [phone]   Fax:  [phone]  Physical Therapy Treatment/Progress note Progress Note reporting period 10/29/20 to 12/26/19  See below for objective and subjective measurements relating to patients progress with PT.   Patient Details  Name: Jonathan Bridges MRN: [id number] Date of Birth: [birth year] Referring Provider (PT): Judith Part, MD   Encounter Date: 12/25/2020   PT End of Session - 12/25/20 1017    Visit Number 22    Number of Visits 24    Date for PT Re-Evaluation 01/21/21    Authorization Type use KX, 12 more visits authorized until 01/22/20    Authorization - Visit Number 10    Authorization - Number of Visits 12    Progress Note Due on Visit 41    PT Start Time 0845    PT Stop Time 0930    PT Time Calculation (min) 45 min    Activity Tolerance Patient tolerated treatment well    Behavior During Therapy [facility] for tasks assessed/performed           Past Medical History:  Diagnosis Date  . Anxiety   . Basal cell carcinoma   . Chronic rhinitis   . Clotting disorder (HCC)    right leg   . Depression   . Diverticulosis   . Elevated LFTs   . History of hypertension   . Hypercholesterolemia   . Sleep apnea     Past Surgical History:  Procedure Laterality Date  . COLONOSCOPY    . WRIST SURGERY  01/24/2003   Right- ORIF: Dr. Amedeo Plenty    There were no vitals filed for this visit.   Subjective Assessment - 12/25/20 0859    Subjective he relays his pain and sorness is about 4/10 in his left sound. He feels he 70% back to baseline recovering since his back surgery    Pertinent History lumbar surgery 07/09/20    Pain Onset More than a month ago              [facility] PT Assessment - 12/25/20 0001      Assessment   Medical Diagnosis lumbar radiculopathy, post op disc herniation operation    Referring Provider (PT) Judith Part, MD    Onset  Date/Surgical Date 07/09/20      AROM   Lumbar Flexion WNL    Lumbar Extension 75%    Lumbar - Right Rotation 75%    Lumbar - Left Rotation 75%      Strength   Overall Strength Comments tested in sitting    Left Hip Flexion 5/5    Left Hip ABduction 5/5    Left Knee Flexion 4+/5    Left Knee Extension 4+/5    Left Ankle Dorsiflexion 3/5    Left Ankle Plantar Flexion 4/5    Left Ankle Inversion 3/5    Left Ankle Eversion 3/5                         [facility] Adult PT Treatment/Exercise - 12/25/20 0001      Neuro Re-ed    Neuro Re-ed Details  stepping over cones step to pattern progressed to step through pattern fwd, then stepping over cones lateral, then SLS 3 cone tap      Lumbar Exercises: Aerobic   Recumbent Bike 8 min L3      Lumbar Exercises: Machines for Strengthening   Cybex Knee Extension 35  lbs 3X10 bilat push    Cybex Knee Flexion 45 lbs bilat pull 2X15    Leg Press Lt leg only 68 lbs for 3 sets of 10      Lumbar Exercises: Supine   Other Supine Lumbar Exercises Lt ankle 4 way 20 reps red band                    PT Short Term Goals - 10/29/20 0855      PT SHORT TERM GOAL #1   Title Pt will be I and compliant with HEP.    Time 4    Period Weeks    Status Achieved    Target Date 10/13/20      PT SHORT TERM GOAL #2   Title Pt will reduce overall back pain >25%    Baseline more numbness than pain now    Time 4    Period Weeks    Status Achieved             PT Long Term Goals - 12/25/20 1020      PT LONG TERM GOAL #1   Title Pt will reduce overall pain >50%    Baseline still about 4-5 out of 10    Time 12    Period Weeks    Status On-going      PT LONG TERM GOAL #2   Title Patient will demonstrate independent use of home exercise program progression to facilitate ability to maintain/progress functional gains from skilled physical therapy services.    Baseline progressed today    Time 12    Period Weeks    Status  On-going      PT LONG TERM GOAL #3   Title Pt will improve lumbar ROM to [facility]    Baseline now met    Time 12    Period Weeks    Status Achieved      PT LONG TERM GOAL #4   Title Pt will be able to ambulate community distances without AD and return to normal ADLS    Baseline can amblate community distances now without AD but still not back to all his normal ADLs    Time 12    Period Weeks    Status On-going      PT LONG TERM GOAL #5   Title [name] 4+ throughout    Baseline still with Lt ankle weakness 3+ to 4    Time 12    Period Weeks    Status On-going                 Plan - 12/25/20 1018    Clinical Impression Statement Progress note today shows good progress with hip/knee strength and overall pain has improved however he still has significant ankle weakness and balance deficits and will continue to benefit from skilled PT.    Examination-Activity Limitations Bend;Dressing;Lift;Stand;Stairs;Squat;Sleep;Locomotion Level    Examination-Participation Restrictions Cleaning;Driving;Laundry;Yard Work;Shop    Stability/Clinical Decision Making Evolving/Moderate complexity    Rehab Potential Good    PT Frequency 2x / week    PT Duration 12 weeks   6-12 weeks   PT Treatment/Interventions ADLs/Self Care Home Management;Cryotherapy;Barrister's clerk;Therapeutic activities;Therapeutic exercise;Neuromuscular re-education;Manual techniques;Passive range of motion;Taping;Spinal Manipulations;Joint Manipulations;Dry needling;Balance training    PT Next Visit Plan continue balance progressions and integrate strength exercises into functional activities    PT Home Exercise Plan Access Code: [email or web address], added ankle 4 way    Consulted and Agree  with Plan of Care Patient           Patient will benefit from skilled therapeutic intervention in order to improve the following deficits and impairments:  Abnormal  gait,Decreased activity tolerance,Decreased balance,Decreased endurance,Decreased coordination,Decreased mobility,Decreased range of motion,Decreased strength,Difficulty walking,Increased muscle spasms,Pain,Improper body mechanics  Visit Diagnosis: Bilateral low back pain with left-sided sciatica, unspecified chronicity  Muscle weakness (generalized)  Other abnormalities of gait and mobility  Localized edema     Problem List Patient Active Problem List   Diagnosis Date Noted  . ED (erectile dysfunction) 08/09/2012  . Hypercholesterolemia   . Chronic rhinitis   . Hyperlipidemia 02/29/2012    [name] 12/25/2020, 10:21 AM  [facility] Physical Therapy [address] [place], [place], [address] Phone: [phone]   Fax:  [phone]  Name: Jonathan Bridges MRN: [id number] Date of Birth: 10-28-1946

## 2020-12-29 ENCOUNTER — Encounter: Payer: Medicare PPO | Admitting: Physical Therapy

## 2020-12-31 ENCOUNTER — Encounter: Payer: Medicare PPO | Admitting: Physical Therapy

## 2021-01-01 ENCOUNTER — Ambulatory Visit: Payer: Medicare PPO | Admitting: Physical Therapy

## 2021-01-01 ENCOUNTER — Other Ambulatory Visit: Payer: Self-pay

## 2021-01-01 ENCOUNTER — Encounter: Payer: Self-pay | Admitting: Physical Therapy

## 2021-01-01 DIAGNOSIS — M5442 Lumbago with sciatica, left side: Secondary | ICD-10-CM

## 2021-01-01 DIAGNOSIS — R600 Localized edema: Secondary | ICD-10-CM

## 2021-01-01 DIAGNOSIS — R2689 Other abnormalities of gait and mobility: Secondary | ICD-10-CM

## 2021-01-01 DIAGNOSIS — M6281 Muscle weakness (generalized): Secondary | ICD-10-CM | POA: Diagnosis not present

## 2021-01-01 NOTE — Therapy (Signed)
[facility] Physical Therapy [address] [place], [place], [address] Phone: [phone]   Fax:  [phone]  Physical Therapy Treatment  During this treatment session, this physical therapist was present, participating in and directing the treatment.   This note has been reviewed and this clinician agrees with the information provided.  Elsie Ra, PT, DPT 01/01/21 9:52 AM     Patient Details  Name: Jonathan Bridges MRN: [id number] Date of Birth: 1946/08/01 Referring Provider (PT): Judith Part, MD   Encounter Date: 01/01/2021   PT End of Session - 01/01/21 0923    Visit Number 23    Number of Visits 24    Date for PT Re-Evaluation 01/21/21    Authorization Type use KX, 12 more visits authorized until 01/22/20    Authorization - Visit Number 10    Authorization - Number of Visits 12    Progress Note Due on Visit 13    PT Start Time 0843    PT Stop Time 0926    PT Time Calculation (min) 43 min    Activity Tolerance Patient tolerated treatment well    Behavior During Therapy [facility] for tasks assessed/performed           Past Medical History:  Diagnosis Date  . Anxiety   . Basal cell carcinoma   . Chronic rhinitis   . Clotting disorder (HCC)    right leg   . Depression   . Diverticulosis   . Elevated LFTs   . History of hypertension   . Hypercholesterolemia   . Sleep apnea     Past Surgical History:  Procedure Laterality Date  . COLONOSCOPY    . WRIST SURGERY  01/24/2003   Right- ORIF: Dr. Amedeo Plenty    There were no vitals filed for this visit.   Subjective Assessment - 01/01/21 0846    Subjective patient came in wearing knee brace today and noted some soreness that lasted from last visit after increasing weights/reps    Pertinent History lumbar surgery 07/09/20    Pain Onset More than a month ago                             [facility] Adult PT Treatment/Exercise - 01/01/21 0850      Neuro Re-ed    Neuro Re-ed Details   tandem walking with constant UE fingertip support 3 laps      Lumbar Exercises: Aerobic   Recumbent Bike 8 min L3      Lumbar Exercises: Machines for Strengthening   Cybex Knee Extension 35 lbs 3X10 bilat push    Cybex Knee Flexion 45 lbs bilat pull 2X15    Leg Press Lt leg only 68 lbs for 2 sets of 10      Lumbar Exercises: Standing   Heel Raises 20 reps;3 seconds   medial ankle ball squeeze   Other Standing Lumbar Exercises airex tandem stance 20 sec bil x3      Lumbar Exercises: Supine   Bridge 20 reps;2 seconds    Other Supine Lumbar Exercises Lt ankle 4 way 20 reps red band    Other Supine Lumbar Exercises trunk rotation knees bent 10 each direction 5 sec hold                    PT Short Term Goals - 10/29/20 0855      PT SHORT TERM GOAL #1   Title Pt will be I  and compliant with HEP.    Time 4    Period Weeks    Status Achieved    Target Date 10/13/20      PT SHORT TERM GOAL #2   Title Pt will reduce overall back pain >25%    Baseline more numbness than pain now    Time 4    Period Weeks    Status Achieved             PT Long Term Goals - 12/25/20 1020      PT LONG TERM GOAL #1   Title Pt will reduce overall pain >50%    Baseline still about 4-5 out of 10    Time 12    Period Weeks    Status On-going      PT LONG TERM GOAL #2   Title Patient will demonstrate independent use of home exercise program progression to facilitate ability to maintain/progress functional gains from skilled physical therapy services.    Baseline progressed today    Time 12    Period Weeks    Status On-going      PT LONG TERM GOAL #3   Title Pt will improve lumbar ROM to [facility]    Baseline now met    Time 12    Period Weeks    Status Achieved      PT LONG TERM GOAL #4   Title Pt will be able to ambulate community distances without AD and return to normal ADLS    Baseline can amblate community distances now without AD but still not back to all his normal ADLs     Time 12    Period Weeks    Status On-going      PT LONG TERM GOAL #5   Title [name] 4+ throughout    Baseline still with Lt ankle weakness 3+ to 4    Time 12    Period Weeks    Status On-going                 Plan - 01/01/21 0926    Clinical Impression Statement Worked well with static and dynamic balance activities today to work on ankle stability and strength. Patient still seems to get fatigued with ankle activities fairly quickly, build strength and then progress to endurance. Leg strength is progressing and patient is able to complete leg press/ext/flex with minimal rest, check in with patient soreness next visit for possible progression of weights.    Examination-Activity Limitations Bend;Dressing;Lift;Stand;Stairs;Squat;Sleep;Locomotion Level    Examination-Participation Restrictions Cleaning;Driving;Laundry;Yard Work;Shop    Stability/Clinical Decision Making Evolving/Moderate complexity    Rehab Potential Good    PT Frequency 2x / week    PT Duration 12 weeks   6-12 weeks   PT Treatment/Interventions ADLs/Self Care Home Management;Cryotherapy;Barrister's clerk;Therapeutic activities;Therapeutic exercise;Neuromuscular re-education;Manual techniques;Passive range of motion;Taping;Spinal Manipulations;Joint Manipulations;Dry needling;Balance training    PT Next Visit Plan continue balance progressions and integrate more functional activities like step up from increased leg strength    PT Home Exercise Plan Access Code: [email or web address], added ankle 4 way    Consulted and Agree with Plan of Care Patient           Patient will benefit from skilled therapeutic intervention in order to improve the following deficits and impairments:  Abnormal gait,Decreased activity tolerance,Decreased balance,Decreased endurance,Decreased coordination,Decreased mobility,Decreased range of motion,Decreased  strength,Difficulty walking,Increased muscle spasms,Pain,Improper body mechanics  Visit Diagnosis: Bilateral low back pain with left-sided sciatica, unspecified  chronicity  Muscle weakness (generalized)  Other abnormalities of gait and mobility  Localized edema     Problem List Patient Active Problem List   Diagnosis Date Noted  . ED (erectile dysfunction) 08/09/2012  . Hypercholesterolemia   . Chronic rhinitis   . Hyperlipidemia 02/29/2012    Glenetta Hew, SPT 01/01/2021, 9:40 AM  [facility] Physical Therapy [address] [place], [place], [address] Phone: [phone]   Fax:  [phone]  Name: Jonathan Bridges MRN: [id number] Date of Birth: 1946/05/14

## 2021-01-05 ENCOUNTER — Encounter: Payer: Medicare PPO | Admitting: Physical Therapy

## 2021-01-06 ENCOUNTER — Other Ambulatory Visit: Payer: Self-pay

## 2021-01-06 ENCOUNTER — Ambulatory Visit: Payer: Medicare PPO | Admitting: Physical Therapy

## 2021-01-06 DIAGNOSIS — M6281 Muscle weakness (generalized): Secondary | ICD-10-CM | POA: Diagnosis not present

## 2021-01-06 DIAGNOSIS — M5442 Lumbago with sciatica, left side: Secondary | ICD-10-CM

## 2021-01-06 DIAGNOSIS — R2689 Other abnormalities of gait and mobility: Secondary | ICD-10-CM

## 2021-01-06 DIAGNOSIS — R600 Localized edema: Secondary | ICD-10-CM | POA: Diagnosis not present

## 2021-01-06 NOTE — Therapy (Signed)
[facility] Physical Therapy [address] [place], [place], [address] Phone: [phone]   Fax:  [phone]  Physical Therapy Treatment  Referring diagnosis? M54.16 Treatment diagnosis? (if different than referring diagnosis)  What was this (referring dx) caused by? _0  Surgery _1  Fall _2  Ongoing issue _3  Arthritis _4  Other: ____________  Laterality: _5  Rt _6  Lt _7  Both  Check all possible CPT codes:      _8  97110 (Therapeutic Exercise)  _9  92507 (SLP Treatment)  _10  65784 (Neuro Re-ed)   _11  92526 (Swallowing Treatment)   _12  69629 (Gait Training)   _13  52841 (Cognitive Training, 1st 15 minutes) _14  97140 (Manual Therapy)   _15  97130 (Cognitive Training, each add'l 15 minutes)  _16  97530 (Therapeutic Activities)  _17  Other, List CPT Code ____________    _18  32440 (Self Care)       _19  All codes above (97110 - 97535)  _20  97012 (Mechanical Traction)  _21  97014 (E-stim Unattended)  _22  97032 (E-stim manual)  _23  97033 (Ionto)  _24  97035 (Ultrasound)  _25  97760 (Orthotic Fit) _26  97750 (Physical Performance Training) _27  [id number] (Aquatic Therapy) _28  10272 (Contrast Bath) _29  53664 (Paraffin) _30  97597 (Wound Care 1st 20 sq cm) _31  97598 (Wound Care each add'l 20 sq cm) _32  97016 (Vasopneumatic Device) _33  40347 (Orthotic Training) _34  [phone] (Prosthetic Training)  Patient Details  Name: Jonathan Bridges MRN: [id number] Date of Birth: 10/23/1946 Referring Provider (PT): Judith Part, MD   Encounter Date: 01/06/2021   PT End of Session - 01/06/21 0928    Visit Number 24    Number of Visits 32    Date for PT Re-Evaluation 02/17/21    Authorization Type use KX, 12 more visits authorized until 01/22/20    Authorization - Visit Number 12    Authorization - Number of Visits 12    Progress Note Due on Visit 57    PT Start Time 0843    PT Stop Time 0931    PT Time Calculation (min) 48 min    Activity Tolerance Patient tolerated treatment well    Behavior  During Therapy [facility] for tasks assessed/performed           Past Medical History:  Diagnosis Date  . Anxiety   . Basal cell carcinoma   . Chronic rhinitis   . Clotting disorder (HCC)    right leg   . Depression   . Diverticulosis   . Elevated LFTs   . History of hypertension   . Hypercholesterolemia   . Sleep apnea     Past Surgical History:  Procedure Laterality Date  . COLONOSCOPY    . WRIST SURGERY  01/24/2003   Right- ORIF: Dr. Amedeo Plenty    There were no vitals filed for this visit.   Subjective Assessment - 01/06/21 0845    Subjective Patient notes some stiffness but overall doing well. He still has some difficulty getting down the stairs first thing in the morning but loosens up from moving around from that stiffness but is feeling optimistic about his progress. He did note some soreness from last visit    Pertinent History lumbar surgery 07/09/20    Pain Onset More than a month ago              [facility] PT Assessment - 01/06/21 0001      AROM   Lumbar Flexion WNL    Lumbar Extension 75%    Lumbar - Right Rotation 75%    Lumbar - Left Rotation 75%  Strength   Left Knee Flexion 4+/5    Left Knee Extension 4+/5    Left Ankle Dorsiflexion 3+/5    Left Ankle Plantar Flexion 4+/5    Left Ankle Inversion 4-/5    Left Ankle Eversion 3+/5                         [facility] Adult PT Treatment/Exercise - 01/06/21 0001      Lumbar Exercises: Aerobic   Recumbent Bike 8 min L3      Lumbar Exercises: Machines for Strengthening   Cybex Knee Extension 35 lbs 3X10 bilat push    Cybex Knee Flexion 45 lbs bilat pull 2X15    Leg Press Lt leg only 68 lbs 3x10(couldn't do last 2 reps)      Lumbar Exercises: Standing   Heel Raises 20 reps;3 seconds   with ball squeeze   Other Standing Lumbar Exercises airex tandem stance 30 sec bil x3    Other Standing Lumbar Exercises tandem walking on airex with UE support PRN 3 laps; sidestepping on airex with fingertip  support 2 laps, side step on airex with fingertip support PRN with forward gaze 2 laps      Lumbar Exercises: Supine   Bridge 20 reps;2 seconds   3 sec hold   Other Supine Lumbar Exercises Lt ankle 4 way 20 reps red band    Other Supine Lumbar Exercises trunk rotation knees bent 10 each direction 5 sec hold                    PT Short Term Goals - 10/29/20 0855      PT SHORT TERM GOAL #1   Title Pt will be I and compliant with HEP.    Time 4    Period Weeks    Status Achieved    Target Date 10/13/20      PT SHORT TERM GOAL #2   Title Pt will reduce overall back pain >25%    Baseline more numbness than pain now    Time 4    Period Weeks    Status Achieved             PT Long Term Goals - 12/25/20 1020      PT LONG TERM GOAL #1   Title Pt will reduce overall pain >50%    Baseline still about 4-5 out of 10    Time 12    Period Weeks    Status On-going      PT LONG TERM GOAL #2   Title Patient will demonstrate independent use of home exercise program progression to facilitate ability to maintain/progress functional gains from skilled physical therapy services.    Baseline progressed today    Time 12    Period Weeks    Status On-going      PT LONG TERM GOAL #3   Title Pt will improve lumbar ROM to [facility]    Baseline now met    Time 12    Period Weeks    Status Achieved      PT LONG TERM GOAL #4   Title Pt will be able to ambulate community distances without AD and return to normal ADLS    Baseline can amblate community distances now without AD but still not back to all his normal ADLs    Time 12    Period Weeks    Status On-going      PT LONG TERM  GOAL #5   Title [name] 4+ throughout    Baseline still with Lt ankle weakness 3+ to 4    Time 12    Period Weeks    Status On-going                 Plan - 01/06/21 0930    Clinical Impression Statement Patient felt good after session today after continuing with ankle  stability progression and leg strength regimen. Patient mentions wanting to focus more on his leg strength to hopefully minimize his limp to complete functional activities around house and in the community with confidence and ease. Reassessed patient's strength to revisit his remaning goals and address any continued deficits. Discussed with patient the remaining deficits and PT reccomends continued therapy for an additional 4 weeks 2x per week. Patient still has significant impairment in ankle stability and strength which impacts his coordination of movement throughout the entire leg. He continues to show improvements in his leg strength that allow for completion of ADLs but would like to see more control in his movements and increase his endurance for community activity.  Next visit we will revisit some combined functional activity for combined ankle and leg strength to add into his HEP.    Examination-Activity Limitations Bend;Dressing;Lift;Stand;Stairs;Squat;Sleep;Locomotion Level    Examination-Participation Restrictions Cleaning;Driving;Laundry;Yard Work;Shop    Stability/Clinical Decision Making Evolving/Moderate complexity    Rehab Potential Good    PT Frequency 2x / week    PT Duration 12 weeks   6-12 weeks   PT Treatment/Interventions ADLs/Self Care Home Management;Cryotherapy;Barrister's clerk;Therapeutic activities;Therapeutic exercise;Neuromuscular re-education;Manual techniques;Passive range of motion;Taping;Spinal Manipulations;Joint Manipulations;Dry needling;Balance training    PT Next Visit Plan continue balance progressions and integrate more functional activities like step up from increased leg strength, step up to unbalaced surfaces, update HEP    PT Home Exercise Plan Access Code: [email or web address], added ankle 4 way    Consulted and Agree with Plan of Care Patient           Patient will benefit from skilled therapeutic intervention in  order to improve the following deficits and impairments:  Abnormal gait,Decreased activity tolerance,Decreased balance,Decreased endurance,Decreased coordination,Decreased mobility,Decreased range of motion,Decreased strength,Difficulty walking,Increased muscle spasms,Pain,Improper body mechanics  Visit Diagnosis: Bilateral low back pain with left-sided sciatica, unspecified chronicity  Muscle weakness (generalized)  Localized edema  Other abnormalities of gait and mobility     Problem List Patient Active Problem List   Diagnosis Date Noted  . ED (erectile dysfunction) 08/09/2012  . Hypercholesterolemia   . Chronic rhinitis   . Hyperlipidemia 02/29/2012    Tawni Levy, SPT  During this treatment session, this physical therapist was present, participating in and directing the treatment.   This note has been reviewed and this clinician agrees with the information provided.    Debbe Odea, PT,DPT 01/06/2021, 10:48 AM  [facility] Physical Therapy [address] [place], [place], [address] Phone: [phone]   Fax:  [phone]  Name: Jonathan Bridges MRN: [id number] Date of Birth: 30-May-1946

## 2021-01-07 ENCOUNTER — Encounter: Payer: Medicare PPO | Admitting: Physical Therapy

## 2021-01-08 ENCOUNTER — Ambulatory Visit: Payer: Medicare PPO | Admitting: Physical Therapy

## 2021-01-08 ENCOUNTER — Other Ambulatory Visit: Payer: Self-pay

## 2021-01-08 DIAGNOSIS — M5442 Lumbago with sciatica, left side: Secondary | ICD-10-CM

## 2021-01-08 DIAGNOSIS — M6281 Muscle weakness (generalized): Secondary | ICD-10-CM

## 2021-01-08 DIAGNOSIS — R600 Localized edema: Secondary | ICD-10-CM

## 2021-01-08 DIAGNOSIS — R2689 Other abnormalities of gait and mobility: Secondary | ICD-10-CM

## 2021-01-08 NOTE — Therapy (Signed)
[place], [place], [address] Phone: [phone]   Fax:  [phone]  Physical Therapy Treatment  Patient Details  Name: Jonathan Bridges MRN: [id number] Date of Birth: June 17, 1946 Referring Provider (PT): Judith Part, MD   Encounter Date: 01/08/2021   PT End of Session - 01/08/21 0925    Visit Number 25    Number of Visits 32    Date for PT Re-Evaluation 02/17/21    Authorization Type use KX, 12 more visits authorized until 01/22/20    Authorization - Visit Number 12    Authorization - Number of Visits 12    Progress Note Due on Visit 31    PT Start Time 0842    PT Stop Time 0926    PT Time Calculation (min) 44 min    Activity Tolerance Patient tolerated treatment well    Behavior During Therapy [facility] for tasks assessed/performed           Past Medical History:  Diagnosis Date  . Anxiety   . Basal cell carcinoma   . Chronic rhinitis   . Clotting disorder (HCC)    right leg   . Depression   . Diverticulosis   . Elevated LFTs   . History of hypertension   . Hypercholesterolemia   . Sleep apnea     Past Surgical History:  Procedure Laterality Date  . COLONOSCOPY    . WRIST SURGERY  01/24/2003   Right- ORIF: Dr. Amedeo Plenty    There were no vitals filed for this visit.   Subjective Assessment - 01/08/21 0845    Subjective Patient relays he feels stiffness in the morning as usual but minimal soreness from last visit. He has been trying to move his ankle to get more mobility back on his own.    Pertinent History lumbar surgery 07/09/20    Pain Onset More than a month ago                             [facility] Adult PT Treatment/Exercise - 01/08/21 0001      Lumbar Exercises: Aerobic   Recumbent Bike 8 min L3      Lumbar Exercises: Machines for Strengthening   Cybex Knee Extension 35 lbs 3X10 bilat push    Cybex Knee Flexion 45 lbs bilat pull 2X15    Leg Press Lt leg only 68 lbs 3x10       Lumbar Exercises: Standing   Heel Raises 20 reps;3 seconds   with toe raises on airex   Other Standing Lumbar Exercises rocker board DF/PF 10x; gastroc soleus stretch 20 sec x2 each, marching with green band around toes 10 each leg      Lumbar Exercises: Supine   Bridge 20 reps;2 seconds    Other Supine Lumbar Exercises Lt ankle 4 way 20 reps red band    Other Supine Lumbar Exercises trunk rotation knees bent 10 each direction 5 sec hold                    PT Short Term Goals - 10/29/20 0855      PT SHORT TERM GOAL #1   Title Pt will be I and compliant with HEP.    Time 4    Period Weeks    Status Achieved    Target Date 10/13/20      PT SHORT TERM GOAL #2   Title Pt will reduce overall  back pain >25%    Baseline more numbness than pain now    Time 4    Period Weeks    Status Achieved             PT Long Term Goals - 12/25/20 1020      PT LONG TERM GOAL #1   Title Pt will reduce overall pain >50%    Baseline still about 4-5 out of 10    Time 12    Period Weeks    Status On-going      PT LONG TERM GOAL #2   Title Patient will demonstrate independent use of home exercise program progression to facilitate ability to maintain/progress functional gains from skilled physical therapy services.    Baseline progressed today    Time 12    Period Weeks    Status On-going      PT LONG TERM GOAL #3   Title Pt will improve lumbar ROM to [facility]    Baseline now met    Time 12    Period Weeks    Status Achieved      PT LONG TERM GOAL #4   Title Pt will be able to ambulate community distances without AD and return to normal ADLS    Baseline can amblate community distances now without AD but still not back to all his normal ADLs    Time 12    Period Weeks    Status On-going      PT LONG TERM GOAL #5   Title [name] 4+ throughout    Baseline still with Lt ankle weakness 3+ to 4    Time 12    Period Weeks    Status On-going                  Plan - 01/08/21 0907    Clinical Impression Statement Patient is able to tolerate more balance activities and is improving his ankle strength gradually for increased stability. Patient still demonstrates some fatigue with strengthening activities but is progressing well overall. He was able to complete 3 sets of 10 on leg press this time, if he maintains this, progress in 2 visits.  Next visit, make progress towards functional activities with stairs and changing directions.    Examination-Activity Limitations Bend;Dressing;Lift;Stand;Stairs;Squat;Sleep;Locomotion Level    Examination-Participation Restrictions Cleaning;Driving;Laundry;Yard Work;Shop    Stability/Clinical Decision Making Evolving/Moderate complexity    Rehab Potential Good    PT Frequency 2x / week    PT Duration 12 weeks   6-12 weeks   PT Treatment/Interventions ADLs/Self Care Home Management;Cryotherapy;Barrister's clerk;Therapeutic activities;Therapeutic exercise;Neuromuscular re-education;Manual techniques;Passive range of motion;Taping;Spinal Manipulations;Joint Manipulations;Dry needling;Balance training    PT Next Visit Plan continue balance progressions and integrate more functional activities like step up from increased leg strength, step up to unbalaced surfaces, update HEP    PT Home Exercise Plan Access Code: [email or web address], added ankle 4 way    Consulted and Agree with Plan of Care Patient           Patient will benefit from skilled therapeutic intervention in order to improve the following deficits and impairments:  Abnormal gait,Decreased activity tolerance,Decreased balance,Decreased endurance,Decreased coordination,Decreased mobility,Decreased range of motion,Decreased strength,Difficulty walking,Increased muscle spasms,Pain,Improper body mechanics  Visit Diagnosis: Bilateral low back pain with left-sided sciatica, unspecified chronicity  Muscle  weakness (generalized)  Localized edema  Other abnormalities of gait and mobility     Problem List Patient Active Problem List   Diagnosis Date Noted  .  ED (erectile dysfunction) 08/09/2012  . Hypercholesterolemia   . Chronic rhinitis   . Hyperlipidemia 02/29/2012    Glenetta Hew, SPT 01/08/2021, 9:27 AM   During this treatment session, this physical therapist was present, participating in and directing the treatment.   This note has been reviewed and this clinician agrees with the information provided.  Elsie Ra, PT, DPT 01/08/21 10:39 AM   [facility] [address] [place], [place], [address] Phone: [phone]   Fax:  [phone]  Name: JORDANNY WADDINGTON MRN: [id number] Date of Birth: [birth year]

## 2021-01-12 ENCOUNTER — Ambulatory Visit: Payer: Medicare PPO | Admitting: Physical Therapy

## 2021-01-12 ENCOUNTER — Other Ambulatory Visit: Payer: Self-pay

## 2021-01-12 DIAGNOSIS — R2689 Other abnormalities of gait and mobility: Secondary | ICD-10-CM | POA: Diagnosis not present

## 2021-01-12 DIAGNOSIS — M5442 Lumbago with sciatica, left side: Secondary | ICD-10-CM

## 2021-01-12 DIAGNOSIS — M6281 Muscle weakness (generalized): Secondary | ICD-10-CM | POA: Diagnosis not present

## 2021-01-12 DIAGNOSIS — R600 Localized edema: Secondary | ICD-10-CM | POA: Diagnosis not present

## 2021-01-12 NOTE — Therapy (Signed)
[facility] [address] [place], [place], [address] Phone: [phone]   Fax:  [phone]  Physical Therapy Treatment  Patient Details  Name: Jonathan Bridges MRN: [id number] Date of Birth: 11/19/1946 Referring Provider (PT): Ostergard, Thomas A, MD   Encounter Date: 01/12/2021   PT End of Session - 01/12/21 0843    Visit Number 26    Number of Visits 32    Date for PT Re-Evaluation 02/17/21    Authorization Type use KX, 12 more visits authorized until 03/13/21    Authorization - Visit Number 2    Authorization - Number of Visits 12    Progress Note Due on Visit 32    PT Start Time 0802    PT Stop Time 0846    PT Time Calculation (min) 44 min    Activity Tolerance Patient tolerated treatment well    Behavior During Therapy WFL for tasks assessed/performed           Past Medical History:  Diagnosis Date  . Anxiety   . Basal cell carcinoma   . Chronic rhinitis   . Clotting disorder (HCC)    right leg   . Depression   . Diverticulosis   . Elevated LFTs   . History of hypertension   . Hypercholesterolemia   . Sleep apnea     Past Surgical History:  Procedure Laterality Date  . COLONOSCOPY    . WRIST SURGERY  01/24/2003   Right- ORIF: Dr. Gramig    There were no vitals filed for this visit.   Subjective Assessment - 01/12/21 0805    Subjective Patient reports low level of achiness and stiffness in the morning but nothing unusual.    Pertinent History lumbar surgery 07/09/20    Pain Onset More than a month ago            [facility] Adult PT Treatment/Exercise - 01/12/21 0001      Lumbar Exercises: Aerobic   Recumbent Bike 8 min L3      Lumbar Exercises: Machines for Strengthening   Cybex Knee Extension 35 lbs 3X10 bilat push    Cybex Knee Flexion 45 lbs 3x10    Leg Press Lt leg only 68 lbs 3x10      Lumbar Exercises: Standing   Other Standing Lumbar Exercises airex tandem stance 30 sec bil x3    Other Standing Lumbar  Exercises rocker board DF/PF 10x with UE ; rocker board balance 20 sec A-P 2x; gastroc soleus stretch 20 sec x2 each, marching with green band around toes 10 each leg, step up onto airex 6'' 10 without UE, 10 with 3 sec hold at top with one hand UE, antirotation green Tband 5x bilat      Lumbar Exercises: Supine   Bridge 20 reps;2 seconds    Other Supine Lumbar Exercises trunk rotation knees bent 10 each direction 5 sec hold                    PT Short Term Goals - 10/29/20 0855      PT SHORT TERM GOAL #1   Title Pt will be I and compliant with HEP.    Time 4    Period Weeks    Status Achieved    Target Date 10/13/20      PT SHORT TERM GOAL #2   Title Pt will reduce overall back pain >25%    Baseline more numbness than pain now    Time 4      Period Weeks    Status Achieved             PT Long Term Goals - 12/25/20 1020      PT LONG TERM GOAL #1   Title Pt will reduce overall pain >50%    Baseline still about 4-5 out of 10    Time 12    Period Weeks    Status On-going      PT LONG TERM GOAL #2   Title Patient will demonstrate independent use of home exercise program progression to facilitate ability to maintain/progress functional gains from skilled physical therapy services.    Baseline progressed today    Time 12    Period Weeks    Status On-going      PT LONG TERM GOAL #3   Title Pt will improve lumbar ROM to [facility]    Baseline now met    Time 12    Period Weeks    Status Achieved      PT LONG TERM GOAL #4   Title Pt will be able to ambulate community distances without AD and return to normal ADLS    Baseline can amblate community distances now without AD but still not back to all his normal ADLs    Time 12    Period Weeks    Status On-going      PT LONG TERM GOAL #5   Title [name] 4+ throughout    Baseline still with Lt ankle weakness 3+ to 4    Time 12    Period Weeks    Status On-going                  Plan - 01/12/21 0844    Clinical Impression Statement Patient is making good progress with his balance and lower extremity strength. Next visit, continue functional balance with unstable surfaces and increase weight for leg press. Continue ankle ROM focus for increased function and decreased stiffness.    Examination-Activity Limitations Bend;Dressing;Lift;Stand;Stairs;Squat;Sleep;Locomotion Level    Examination-Participation Restrictions Cleaning;Driving;Laundry;Yard Work;Shop    Stability/Clinical Decision Making Evolving/Moderate complexity    Rehab Potential Good    PT Frequency 2x / week    PT Duration 12 weeks   6-12 weeks   PT Treatment/Interventions ADLs/Self Care Home Management;Cryotherapy;Barrister's clerk;Therapeutic activities;Therapeutic exercise;Neuromuscular re-education;Manual techniques;Passive range of motion;Taping;Spinal Manipulations;Joint Manipulations;Dry needling;Balance training    PT Next Visit Plan continue balance progressions and integrate more functional activities like step up from increased leg strength, step up to unbalaced surfaces, update HEP    PT Home Exercise Plan Access Code: [email or web address], added ankle 4 way    Consulted and Agree with Plan of Care Patient           Patient will benefit from skilled therapeutic intervention in order to improve the following deficits and impairments:  Abnormal gait,Decreased activity tolerance,Decreased balance,Decreased endurance,Decreased coordination,Decreased mobility,Decreased range of motion,Decreased strength,Difficulty walking,Increased muscle spasms,Pain,Improper body mechanics  Visit Diagnosis: Bilateral low back pain with left-sided sciatica, unspecified chronicity  Muscle weakness (generalized)  Localized edema  Other abnormalities of gait and mobility     Problem List Patient Active Problem List   Diagnosis Date Noted  . ED (erectile dysfunction)  08/09/2012  . Hypercholesterolemia   . Chronic rhinitis   . Hyperlipidemia 02/29/2012    Glenetta Hew, SPT 01/12/2021, 8:57 AM   During this treatment session, this physical therapist was present, participating in and directing the treatment.   This note has  been reviewed and this clinician agrees with the information provided.  Elsie Ra, PT, DPT 01/12/21 1:31 PM   [facility] [address] [place], [place], [address] Phone: [phone]   Fax:  [phone]  Name: RONNY KORFF MRN: [id number] Date of Birth: 1946-01-15

## 2021-01-14 ENCOUNTER — Encounter: Payer: Medicare PPO | Admitting: Physical Therapy

## 2021-01-15 ENCOUNTER — Other Ambulatory Visit: Payer: Self-pay

## 2021-01-15 ENCOUNTER — Ambulatory Visit: Payer: Medicare PPO | Admitting: Physical Therapy

## 2021-01-15 DIAGNOSIS — R2689 Other abnormalities of gait and mobility: Secondary | ICD-10-CM | POA: Diagnosis not present

## 2021-01-15 DIAGNOSIS — M5442 Lumbago with sciatica, left side: Secondary | ICD-10-CM

## 2021-01-15 DIAGNOSIS — M6281 Muscle weakness (generalized): Secondary | ICD-10-CM | POA: Diagnosis not present

## 2021-01-15 DIAGNOSIS — R600 Localized edema: Secondary | ICD-10-CM

## 2021-01-15 NOTE — Therapy (Signed)
[place], [place], [address] Phone: [phone]   Fax:  [phone]  Physical Therapy Treatment  Patient Details  Name: Jonathan Bridges MRN: [id number] Date of Birth: Dec 24, 1945 Referring Provider (PT): Judith Part, MD   Encounter Date: 01/15/2021   PT End of Session - 01/15/21 0832    Visit Number 27    Number of Visits 32    Date for PT Re-Evaluation 02/17/21    Authorization Type use KX, 12 more visits authorized until 03/13/21    Authorization - Visit Number 3    Authorization - Number of Visits 12    Progress Note Due on Visit 32    PT Start Time 0800    PT Stop Time 0842    PT Time Calculation (min) 42 min    Activity Tolerance Patient tolerated treatment well    Behavior During Therapy [facility] for tasks assessed/performed           Past Medical History:  Diagnosis Date  . Anxiety   . Basal cell carcinoma   . Chronic rhinitis   . Clotting disorder (HCC)    right leg   . Depression   . Diverticulosis   . Elevated LFTs   . History of hypertension   . Hypercholesterolemia   . Sleep apnea     Past Surgical History:  Procedure Laterality Date  . COLONOSCOPY    . WRIST SURGERY  01/24/2003   Right- ORIF: Dr. Amedeo Plenty    There were no vitals filed for this visit.   Subjective Assessment - 01/15/21 0805    Subjective Patient relays usual stiffness, not much swelling to report. He still is wearing his compression sock most of the day. He does report his Lt foot being cold when he goes to sleep but no N/T.    Pertinent History lumbar surgery 07/09/20    Pain Onset More than a month ago                             [facility] Adult PT Treatment/Exercise - 01/15/21 0001      Lumbar Exercises: Aerobic   Nustep L6 X 8 min UE/LE seat 12      Lumbar Exercises: Machines for Strengthening   Cybex Knee Extension 35 lbs 3X10 bilat push    Cybex Knee Flexion 45 lbs 3x10    Leg Press Lt leg only  68 lbs 2x10, 75 lbs 1x10      Lumbar Exercises: Standing   Heel Raises 20 reps   with constant UE   Other Standing Lumbar Exercises tandem stance 20 sec x4 with UE PRN, rocker board 10x DF/PF, standing marching with green tband around toes 10x each leg, antirotation with red tband 5x each direction, step up onto airex with UE 8'' 2x10, retro step down 10x 6'' wiht UE      Lumbar Exercises: Supine   Bridge with Ball Squeeze 20 reps;3 seconds    Other Supine Lumbar Exercises trunk rotation knees bent 10 each direction 5 sec hold                    PT Short Term Goals - 10/29/20 0855      PT SHORT TERM GOAL #1   Title Pt will be I and compliant with HEP.    Time 4    Period Weeks    Status Achieved    Target Date 10/13/20  PT SHORT TERM GOAL #2   Title Pt will reduce overall back pain >25%    Baseline more numbness than pain now    Time 4    Period Weeks    Status Achieved             PT Long Term Goals - 12/25/20 1020      PT LONG TERM GOAL #1   Title Pt will reduce overall pain >50%    Baseline still about 4-5 out of 10    Time 12    Period Weeks    Status On-going      PT LONG TERM GOAL #2   Title Patient will demonstrate independent use of home exercise program progression to facilitate ability to maintain/progress functional gains from skilled physical therapy services.    Baseline progressed today    Time 12    Period Weeks    Status On-going      PT LONG TERM GOAL #3   Title Pt will improve lumbar ROM to [facility]    Baseline now met    Time 12    Period Weeks    Status Achieved      PT LONG TERM GOAL #4   Title Pt will be able to ambulate community distances without AD and return to normal ADLS    Baseline can amblate community distances now without AD but still not back to all his normal ADLs    Time 12    Period Weeks    Status On-going      PT LONG TERM GOAL #5   Title [name] 4+ throughout    Baseline still  with Lt ankle weakness 3+ to 4    Time 12    Period Weeks    Status On-going                 Plan - 01/15/21 0844    Clinical Impression Statement Patient tolerated small increases in strength activities well today with slight soreness after session today. He continues to gain LE strength but needs more stability and balance combination work, will focus on this in next treatment.    Examination-Activity Limitations Bend;Dressing;Lift;Stand;Stairs;Squat;Sleep;Locomotion Level    Examination-Participation Restrictions Cleaning;Driving;Laundry;Yard Work;Shop    Stability/Clinical Decision Making Evolving/Moderate complexity    Rehab Potential Good    PT Frequency 2x / week    PT Duration 12 weeks   6-12 weeks   PT Treatment/Interventions ADLs/Self Care Home Management;Cryotherapy;Barrister's clerk;Therapeutic activities;Therapeutic exercise;Neuromuscular re-education;Manual techniques;Passive range of motion;Taping;Spinal Manipulations;Joint Manipulations;Dry needling;Balance training    PT Next Visit Plan continue balance progressions and integrate more functional activities like step up from increased leg strength, step up to unbalaced surfaces, retro step up/eccentric    PT Home Exercise Plan Access Code: [email or web address], added ankle 4 way    Consulted and Agree with Plan of Care Patient           Patient will benefit from skilled therapeutic intervention in order to improve the following deficits and impairments:  Abnormal gait,Decreased activity tolerance,Decreased balance,Decreased endurance,Decreased coordination,Decreased mobility,Decreased range of motion,Decreased strength,Difficulty walking,Increased muscle spasms,Pain,Improper body mechanics  Visit Diagnosis: Bilateral low back pain with left-sided sciatica, unspecified chronicity  Muscle weakness (generalized)  Localized edema  Other abnormalities of gait and  mobility     Problem List Patient Active Problem List   Diagnosis Date Noted  . ED (erectile dysfunction) 08/09/2012  . Hypercholesterolemia   . Chronic rhinitis   .  Hyperlipidemia 02/29/2012    Glenetta Hew, SPT 01/15/2021, 8:48 AM   During this treatment session, this physical therapist was present, participating in and directing the treatment.   This note has been reviewed and this clinician agrees with the information provided.  Elsie Ra, PT, DPT 01/15/21 9:53 AM  [facility] [address] [place], [place], [address] Phone: [phone]   Fax:  [phone]  Name: Jonathan Bridges MRN: [id number] Date of Birth: 1946/07/04

## 2021-01-19 ENCOUNTER — Other Ambulatory Visit: Payer: Self-pay | Admitting: Family Medicine

## 2021-01-19 ENCOUNTER — Other Ambulatory Visit: Payer: Self-pay

## 2021-01-19 ENCOUNTER — Ambulatory Visit: Payer: Medicare PPO | Admitting: Physical Therapy

## 2021-01-19 DIAGNOSIS — M6281 Muscle weakness (generalized): Secondary | ICD-10-CM | POA: Diagnosis not present

## 2021-01-19 DIAGNOSIS — R2689 Other abnormalities of gait and mobility: Secondary | ICD-10-CM

## 2021-01-19 DIAGNOSIS — R600 Localized edema: Secondary | ICD-10-CM | POA: Diagnosis not present

## 2021-01-19 DIAGNOSIS — E782 Mixed hyperlipidemia: Secondary | ICD-10-CM

## 2021-01-19 DIAGNOSIS — M5442 Lumbago with sciatica, left side: Secondary | ICD-10-CM | POA: Diagnosis not present

## 2021-01-19 NOTE — Telephone Encounter (Signed)
Requested medication (s) are due for refill today: yes  Requested medication (s) are on the active medication list: yes  Last refill:  last refilled by Dr. Linna Darner  Future visit scheduled: no  Notes to clinic:  Please review for refill. Unable to refill per protocol. No recent lipid panel    Requested Prescriptions  Pending Prescriptions Disp Refills   pravastatin (PRAVACHOL) 40 MG tablet [Pharmacy Med Name: PRAVASTATIN 40MG  TABLETS] 90 tablet 1    Sig: TAKE 1 TABLET(40 MG) BY MOUTH DAILY      Cardiovascular:  Antilipid - Statins Failed - 01/19/2021 11:48 AM      Failed - Total Cholesterol in normal range and within 360 days    Cholesterol, Total  Date Value Ref Range Status  01/08/2020 226 (H) 100 - 199 mg/dL Final          Failed - LDL in normal range and within 360 days    LDL Chol Calc (NIH)  Date Value Ref Range Status  01/08/2020 149 (H) 0 - 99 mg/dL Final          Failed - HDL in normal range and within 360 days    HDL  Date Value Ref Range Status  01/08/2020 57 >39 mg/dL Final          Failed - Triglycerides in normal range and within 360 days    Triglycerides  Date Value Ref Range Status  01/08/2020 111 0 - 149 mg/dL Final          Passed - Patient is not pregnant      Passed - Valid encounter within last 12 months    Recent Outpatient Visits           8 months ago Iliopsoas bursitis of left hip   Primary Care at [facility], Fenton Malling, MD   1 year ago Encounter for Medicare annual wellness exam   Primary Care at [facility], Arlie Solomons, MD   1 year ago Medicare annual wellness visit, subsequent   Primary Care at [facility], Arlie Solomons, MD   1 year ago Right anterior shoulder pain   Primary Care at [facility] Arlie Solomons, MD   1 year ago Mixed hyperlipidemia   Primary Care at [facility], Arlie Solomons, MD

## 2021-01-19 NOTE — Therapy (Signed)
[place], [place], [address] Phone: [phone]   Fax:  [phone]  Physical Therapy Treatment  Patient Details  Name: Jonathan Bridges MRN: [id number] Date of Birth: [birth year] Referring Provider (PT): Judith Part, MD   Encounter Date: 01/19/2021   PT End of Session - 01/19/21 1019    Visit Number 28    Number of Visits 32    Date for PT Re-Evaluation 02/17/21    Authorization Type use KX, 12 more visits authorized until 03/13/21    Authorization - Visit Number 4    Authorization - Number of Visits 12    Progress Note Due on Visit 106    PT Start Time 0931    PT Stop Time 1015    PT Time Calculation (min) 44 min    Activity Tolerance Patient tolerated treatment well    Behavior During Therapy [facility] for tasks assessed/performed           Past Medical History:  Diagnosis Date  . Anxiety   . Basal cell carcinoma   . Chronic rhinitis   . Clotting disorder (HCC)    right leg   . Depression   . Diverticulosis   . Elevated LFTs   . History of hypertension   . Hypercholesterolemia   . Sleep apnea     Past Surgical History:  Procedure Laterality Date  . COLONOSCOPY    . WRIST SURGERY  01/24/2003   Right- ORIF: Dr. Amedeo Plenty    There were no vitals filed for this visit.   Subjective Assessment - 01/19/21 1017    Subjective Patient has minimal pain today. He did some yard work over the weekend but no increased pain, just noted some muscle twitches in his toes afterwards.    Pertinent History lumbar surgery 07/09/20    Pain Onset More than a month ago              [facility] Adult PT Treatment/Exercise - 01/19/21 0001      Exercises   Exercises Ankle      Lumbar Exercises: Aerobic   Recumbent Bike 8 min L4      Lumbar Exercises: Machines for Strengthening   Cybex Knee Extension 35 lbs 3X10 bilat push    Cybex Knee Flexion 45 lbs 3x10    Leg Press Lt leg only 3x10 75 lbs      Lumbar Exercises:  Standing   Heel Raises 20 reps    Other Standing Lumbar Exercises tandem stance 20 secx4 with UE PRN, lateral step up eccentric lowering 10x 6'' with UE, restro step up 6'' with UE, lateral step up and over with airex 10x, antirotation with red band 10x each side                    PT Short Term Goals - 10/29/20 0855      PT SHORT TERM GOAL #1   Title Pt will be I and compliant with HEP.    Time 4    Period Weeks    Status Achieved    Target Date 10/13/20      PT SHORT TERM GOAL #2   Title Pt will reduce overall back pain >25%    Baseline more numbness than pain now    Time 4    Period Weeks    Status Achieved             PT Long Term Goals - 12/25/20 1020  PT LONG TERM GOAL #1   Title Pt will reduce overall pain >50%    Baseline still about 4-5 out of 10    Time 12    Period Weeks    Status On-going      PT LONG TERM GOAL #2   Title Patient will demonstrate independent use of home exercise program progression to facilitate ability to maintain/progress functional gains from skilled physical therapy services.    Baseline progressed today    Time 12    Period Weeks    Status On-going      PT LONG TERM GOAL #3   Title Pt will improve lumbar ROM to [facility]    Baseline now met    Time 12    Period Weeks    Status Achieved      PT LONG TERM GOAL #4   Title Pt will be able to ambulate community distances without AD and return to normal ADLS    Baseline can amblate community distances now without AD but still not back to all his normal ADLs    Time 12    Period Weeks    Status On-going      PT LONG TERM GOAL #5   Title [name] 4+ throughout    Baseline still with Lt ankle weakness 3+ to 4    Time 12    Period Weeks    Status On-going                 Plan - 01/19/21 1021    Clinical Impression Statement Patient is continuing to make strides in LE strength but struggles with isolated muscle group exercises at times.  He tolerates balance exercises well, however, he can benefit from a dynamic balance and plantar flexion strength focus for continued progress.    Examination-Activity Limitations Bend;Dressing;Lift;Stand;Stairs;Squat;Sleep;Locomotion Level    Examination-Participation Restrictions Cleaning;Driving;Laundry;Yard Work;Shop    Stability/Clinical Decision Making Evolving/Moderate complexity    Rehab Potential Good    PT Frequency 2x / week    PT Duration 12 weeks   6-12 weeks   PT Treatment/Interventions ADLs/Self Care Home Management;Cryotherapy;Barrister's clerk;Therapeutic activities;Therapeutic exercise;Neuromuscular re-education;Manual techniques;Passive range of motion;Taping;Spinal Manipulations;Joint Manipulations;Dry needling;Balance training    PT Next Visit Plan step up move to 8'', isolated ankle exerises 4'' or 6'' step up to unbalaced surfaces, retro step up/eccentric,    PT Home Exercise Plan Access Code: [email or web address], added ankle 4 way    Consulted and Agree with Plan of Care Patient           Patient will benefit from skilled therapeutic intervention in order to improve the following deficits and impairments:  Abnormal gait,Decreased activity tolerance,Decreased balance,Decreased endurance,Decreased coordination,Decreased mobility,Decreased range of motion,Decreased strength,Difficulty walking,Increased muscle spasms,Pain,Improper body mechanics  Visit Diagnosis: Bilateral low back pain with left-sided sciatica, unspecified chronicity  Muscle weakness (generalized)  Localized edema  Other abnormalities of gait and mobility     Problem List Patient Active Problem List   Diagnosis Date Noted  . ED (erectile dysfunction) 08/09/2012  . Hypercholesterolemia   . Chronic rhinitis   . Hyperlipidemia 02/29/2012    Glenetta Hew, SPT 01/19/2021, 10:29 AM   During this treatment session, this physical therapist was present,  participating in and directing the treatment.   This note has been reviewed and this clinician agrees with the information provided.    Elsie Ra, PT, DPT 01/19/21 11:22 AM   [facility] [place],  [place], [address] Phone: [phone]   Fax:  [phone]  Name: Jonathan Bridges MRN: [id number] Date of Birth: 1946/10/06

## 2021-01-22 ENCOUNTER — Other Ambulatory Visit: Payer: Self-pay

## 2021-01-22 ENCOUNTER — Ambulatory Visit: Payer: Medicare PPO | Admitting: Physical Therapy

## 2021-01-22 DIAGNOSIS — M25511 Pain in right shoulder: Secondary | ICD-10-CM

## 2021-01-22 DIAGNOSIS — M25611 Stiffness of right shoulder, not elsewhere classified: Secondary | ICD-10-CM

## 2021-01-22 DIAGNOSIS — M5442 Lumbago with sciatica, left side: Secondary | ICD-10-CM | POA: Diagnosis not present

## 2021-01-22 DIAGNOSIS — M6281 Muscle weakness (generalized): Secondary | ICD-10-CM

## 2021-01-22 DIAGNOSIS — R2689 Other abnormalities of gait and mobility: Secondary | ICD-10-CM | POA: Diagnosis not present

## 2021-01-22 DIAGNOSIS — R600 Localized edema: Secondary | ICD-10-CM | POA: Diagnosis not present

## 2021-01-22 NOTE — Therapy (Signed)
[place], [place], [address] Phone: [phone]   Fax:  [phone]  Physical Therapy Treatment  Patient Details  Name: Jonathan Bridges MRN: [id number] Date of Birth: 06-Feb-1946 Referring Provider (PT): Judith Part, MD   Encounter Date: 01/22/2021   PT End of Session - 01/22/21 1107    Visit Number 29    Number of Visits 32    Date for PT Re-Evaluation 02/17/21    Authorization Type use KX, 12 more visits authorized until 03/13/21    Authorization - Visit Number 5    Authorization - Number of Visits 12    Progress Note Due on Visit 71    PT Start Time 1017    PT Stop Time 1101    PT Time Calculation (min) 44 min    Activity Tolerance Patient tolerated treatment well    Behavior During Therapy [facility] for tasks assessed/performed           Past Medical History:  Diagnosis Date  . Anxiety   . Basal cell carcinoma   . Chronic rhinitis   . Clotting disorder (HCC)    right leg   . Depression   . Diverticulosis   . Elevated LFTs   . History of hypertension   . Hypercholesterolemia   . Sleep apnea     Past Surgical History:  Procedure Laterality Date  . COLONOSCOPY    . WRIST SURGERY  01/24/2003   Right- ORIF: Dr. Amedeo Plenty    There were no vitals filed for this visit.   Subjective Assessment - 01/22/21 1033    Subjective Patient is doing well today and note still havig toe twitches. He also relays it is easier to descend stairs.    Pertinent History lumbar surgery 07/09/20    Pain Onset More than a month ago                             [facility] Adult PT Treatment/Exercise - 01/22/21 0001      Lumbar Exercises: Aerobic   Nustep L6 X 8 min UE/LE seat 12      Lumbar Exercises: Machines for Strengthening   Cybex Knee Extension 35 lbs 3X10 bilat push    Cybex Knee Flexion 45 lbs 3x10    Leg Press Lt leg only 3x10 75 lbs      Lumbar Exercises: Standing   Heel Raises 10 reps   focus on  weight shift to L for eccentric lowering   Other Standing Lumbar Exercises rocker board balance 2 mins ; 6 in lateral step up TKE green band 15x,6'' step down with UE with retro step up 15x    Other Standing Lumbar Exercises red band antirotation side stepping 5xeach side      Lumbar Exercises: Seated   Other Seated Lumbar Exercises stability ball marching with UE 15x, stability ball paloff press 12x each side red band      Ankle Exercises: Stretches   Slant Board Stretch 30 seconds;4 reps                    PT Short Term Goals - 10/29/20 0855      PT SHORT TERM GOAL #1   Title Pt will be I and compliant with HEP.    Time 4    Period Weeks    Status Achieved    Target Date 10/13/20      PT SHORT TERM  GOAL #2   Title Pt will reduce overall back pain >25%    Baseline more numbness than pain now    Time 4    Period Weeks    Status Achieved             PT Long Term Goals - 12/25/20 1020      PT LONG TERM GOAL #1   Title Pt will reduce overall pain >50%    Baseline still about 4-5 out of 10    Time 12    Period Weeks    Status On-going      PT LONG TERM GOAL #2   Title Patient will demonstrate independent use of home exercise program progression to facilitate ability to maintain/progress functional gains from skilled physical therapy services.    Baseline progressed today    Time 12    Period Weeks    Status On-going      PT LONG TERM GOAL #3   Title Pt will improve lumbar ROM to [facility]    Baseline now met    Time 12    Period Weeks    Status Achieved      PT LONG TERM GOAL #4   Title Pt will be able to ambulate community distances without AD and return to normal ADLS    Baseline can amblate community distances now without AD but still not back to all his normal ADLs    Time 12    Period Weeks    Status On-going      PT LONG TERM GOAL #5   Title [name] 4+ throughout    Baseline still with Lt ankle weakness 3+ to 4     Time 12    Period Weeks    Status On-going                 Plan - 01/22/21 1108    Clinical Impression Statement Worked well with increased Ankle eccentric work. He still exhibits weakness with single leg but is progressing in his stable and dynamic balance. Continue LE strengtheng for improved function.    Examination-Activity Limitations Bend;Dressing;Lift;Stand;Stairs;Squat;Sleep;Locomotion Level    Examination-Participation Restrictions Cleaning;Driving;Laundry;Yard Work;Shop    Stability/Clinical Decision Making Evolving/Moderate complexity    Rehab Potential Good    PT Frequency 2x / week    PT Duration 12 weeks   6-12 weeks   PT Treatment/Interventions ADLs/Self Care Home Management;Cryotherapy;Barrister's clerk;Therapeutic activities;Therapeutic exercise;Neuromuscular re-education;Manual techniques;Passive range of motion;Taping;Spinal Manipulations;Joint Manipulations;Dry needling;Balance training    PT Next Visit Plan 4'' or 6'' step up to unbalaced surfaces, retro step up/eccentric, TKE quad    PT Home Exercise Plan Access Code: [email or web address], added ankle 4 way    Consulted and Agree with Plan of Care Patient           Patient will benefit from skilled therapeutic intervention in order to improve the following deficits and impairments:  Abnormal gait,Decreased activity tolerance,Decreased balance,Decreased endurance,Decreased coordination,Decreased mobility,Decreased range of motion,Decreased strength,Difficulty walking,Increased muscle spasms,Pain,Improper body mechanics  Visit Diagnosis: Bilateral low back pain with left-sided sciatica, unspecified chronicity  Muscle weakness (generalized)  Localized edema  Other abnormalities of gait and mobility  Right shoulder pain, unspecified chronicity  Stiffness of right shoulder, not elsewhere classified     Problem List Patient Active Problem List   Diagnosis Date  Noted  . ED (erectile dysfunction) 08/09/2012  . Hypercholesterolemia   . Chronic rhinitis   . Hyperlipidemia 02/29/2012    Mendel Ryder  Donna Christen, SPT 01/22/2021, 11:10 AM  [facility] [address] [place], [place], [address] Phone: [phone]   Fax:  [phone]  Name: Jonathan Bridges MRN: [id number] Date of Birth: 07/24/1946

## 2021-01-26 ENCOUNTER — Ambulatory Visit: Payer: Medicare PPO | Admitting: Physical Therapy

## 2021-01-26 ENCOUNTER — Encounter: Payer: Medicare PPO | Admitting: Physical Therapy

## 2021-01-26 ENCOUNTER — Other Ambulatory Visit: Payer: Self-pay

## 2021-01-26 DIAGNOSIS — M5442 Lumbago with sciatica, left side: Secondary | ICD-10-CM

## 2021-01-26 DIAGNOSIS — M6281 Muscle weakness (generalized): Secondary | ICD-10-CM | POA: Diagnosis not present

## 2021-01-26 DIAGNOSIS — R2689 Other abnormalities of gait and mobility: Secondary | ICD-10-CM | POA: Diagnosis not present

## 2021-01-26 DIAGNOSIS — R600 Localized edema: Secondary | ICD-10-CM

## 2021-01-26 DIAGNOSIS — M25611 Stiffness of right shoulder, not elsewhere classified: Secondary | ICD-10-CM

## 2021-01-26 DIAGNOSIS — M25511 Pain in right shoulder: Secondary | ICD-10-CM

## 2021-01-26 NOTE — Therapy (Signed)
[place], [place], [address] Phone: [phone]   Fax:  [phone]  Physical Therapy Treatment  Patient Details  Name: Jonathan Bridges MRN: [id number] Date of Birth: 11-08-1946 Referring Provider (PT): Judith Part, MD   Encounter Date: 01/26/2021   PT End of Session - 01/26/21 1423    Visit Number 30    Number of Visits 32    Date for PT Re-Evaluation 02/17/21    Authorization Type use KX, 12 more visits authorized until 03/13/21    Authorization - Visit Number 6    Authorization - Number of Visits 12    Progress Note Due on Visit 61    PT Start Time 4627    PT Stop Time 1423    PT Time Calculation (min) 45 min    Activity Tolerance Patient tolerated treatment well    Behavior During Therapy [facility] for tasks assessed/performed           Past Medical History:  Diagnosis Date  . Anxiety   . Basal cell carcinoma   . Chronic rhinitis   . Clotting disorder (HCC)    right leg   . Depression   . Diverticulosis   . Elevated LFTs   . History of hypertension   . Hypercholesterolemia   . Sleep apnea     Past Surgical History:  Procedure Laterality Date  . COLONOSCOPY    . WRIST SURGERY  01/24/2003   Right- ORIF: Dr. Amedeo Plenty    There were no vitals filed for this visit.   Subjective Assessment - 01/26/21 1343    Subjective Patient has the usual stiffness but cannot think of anything at home or his activities that he is limited in. He still believe he is lacking in his ankle strength and stability.    Pertinent History lumbar surgery 07/09/20    Currently in Pain? No/denies    Pain Onset More than a month ago            [facility] Adult PT Treatment/Exercise - 01/26/21 0001      Lumbar Exercises: Aerobic   Recumbent Bike 8 min L5      Lumbar Exercises: Machines for Strengthening   Cybex Knee Extension 35 lbs 3X10 bilat push    Cybex Knee Flexion 45 lbs 3x10    Leg Press Lt leg only 3x10 75 lbs       Lumbar Exercises: Standing   Heel Raises 10 reps   weight shift to L side eccentric lowering   Other Standing Lumbar Exercises green band TKE 2x10, lateral step up and over 10x 6'' with UE support, retro step up 4'' with UE support    Other Standing Lumbar Exercises green band antirotation side stepping 8x each, tandem stance bilateral on airex 30 sec 4x      Ankle Exercises: Standing   Rocker Board 5 minutes   DL DF/PF 10x taps, SL Df/PF 10x taps, AP/AL balance 2x each   Other Standing Ankle Exercises tandem stance 20 sec bilat x4, airex alt cone tapping 10x each leg,                    PT Short Term Goals - 10/29/20 0855      PT SHORT TERM GOAL #1   Title Pt will be I and compliant with HEP.    Time 4    Period Weeks    Status Achieved    Target Date 10/13/20  PT SHORT TERM GOAL #2   Title Pt will reduce overall back pain >25%    Baseline more numbness than pain now    Time 4    Period Weeks    Status Achieved             PT Long Term Goals - 01/26/21 1426      PT LONG TERM GOAL #1   Title Pt will reduce overall pain >50%    Baseline patient believe this goal has been met with current pain management    Time 12    Period Weeks    Status Achieved      PT LONG TERM GOAL #2   Title Patient will demonstrate independent use of home exercise program progression to facilitate ability to maintain/progress functional gains from skilled physical therapy services.    Baseline progressed today    Time 12    Period Weeks    Status On-going      PT LONG TERM GOAL #3   Title Pt will improve lumbar ROM to [facility]    Baseline now met    Time 12    Period Weeks    Status Achieved      PT LONG TERM GOAL #4   Title Pt will be able to ambulate community distances without AD and return to normal ADLS    Baseline can amblate community distances now without AD but still not back to all his normal ADLs    Time 12    Period Weeks    Status On-going      PT LONG TERM  GOAL #5   Title [name] 4+ throughout    Baseline still with Lt ankle weakness 3+ to 4    Time 12    Period Weeks    Status On-going                 Plan - 01/26/21 1424    Clinical Impression Statement Today's session emphasized ankle stability and PF strength. Patient tolerates exercises well but fatigues easily with prolonged PF. Discussed with patient continuing skilled PT to address remaining deficits and eventually transitioning to advanced HEP for him to manage independently.    Examination-Activity Limitations Bend;Dressing;Lift;Stand;Stairs;Squat;Sleep;Locomotion Level    Examination-Participation Restrictions Cleaning;Driving;Laundry;Yard Work;Shop    Stability/Clinical Decision Making Evolving/Moderate complexity    Rehab Potential Good    PT Frequency 2x / week    PT Duration 12 weeks   6-12 weeks   PT Treatment/Interventions ADLs/Self Care Home Management;Cryotherapy;Barrister's clerk;Therapeutic activities;Therapeutic exercise;Neuromuscular re-education;Manual techniques;Passive range of motion;Taping;Spinal Manipulations;Joint Manipulations;Dry needling;Balance training    PT Next Visit Plan 4'' or 6'' step up to unbalaced surfaces, retro step up/eccentric, TKE quad work, dynamic balance beam on foam    PT Home Exercise Plan Access Code: [email or web address], added ankle 4 way    Consulted and Agree with Plan of Care Patient           Patient will benefit from skilled therapeutic intervention in order to improve the following deficits and impairments:  Abnormal gait,Decreased activity tolerance,Decreased balance,Decreased endurance,Decreased coordination,Decreased mobility,Decreased range of motion,Decreased strength,Difficulty walking,Increased muscle spasms,Pain,Improper body mechanics  Visit Diagnosis: Bilateral low back pain with left-sided sciatica, unspecified chronicity  Muscle weakness  (generalized)  Localized edema  Other abnormalities of gait and mobility  Stiffness of right shoulder, not elsewhere classified  Right shoulder pain, unspecified chronicity     Problem List Patient Active Problem List   Diagnosis Date  Noted  . ED (erectile dysfunction) 08/09/2012  . Hypercholesterolemia   . Chronic rhinitis   . Hyperlipidemia 02/29/2012    Glenetta Hew, SPT 01/26/2021, 2:27 PM   During this treatment session, this physical therapist was present, participating in and directing the treatment.   This note has been reviewed and this clinician agrees with the information provided.  Elsie Ra, PT, DPT 01/26/21 2:34 PM   [facility] [address] [place], [place], [address] Phone: [phone]   Fax:  [phone]  Name: Jonathan Bridges MRN: [id number] Date of Birth: Oct 22, 1946

## 2021-01-29 ENCOUNTER — Other Ambulatory Visit: Payer: Self-pay

## 2021-01-29 ENCOUNTER — Ambulatory Visit: Payer: Medicare PPO | Admitting: Physical Therapy

## 2021-01-29 DIAGNOSIS — M6281 Muscle weakness (generalized): Secondary | ICD-10-CM

## 2021-01-29 DIAGNOSIS — M25611 Stiffness of right shoulder, not elsewhere classified: Secondary | ICD-10-CM

## 2021-01-29 DIAGNOSIS — R600 Localized edema: Secondary | ICD-10-CM

## 2021-01-29 DIAGNOSIS — M5442 Lumbago with sciatica, left side: Secondary | ICD-10-CM | POA: Diagnosis not present

## 2021-01-29 DIAGNOSIS — R2689 Other abnormalities of gait and mobility: Secondary | ICD-10-CM | POA: Diagnosis not present

## 2021-01-29 DIAGNOSIS — M25511 Pain in right shoulder: Secondary | ICD-10-CM

## 2021-01-29 NOTE — Therapy (Signed)
[place], [place], [address] Phone: [phone]   Fax:  [phone]  Physical Therapy Treatment  Patient Details  Name: Jonathan Bridges MRN: [id number] Date of Birth: 1946/02/26 Referring Provider (PT): Judith Part, MD   Encounter Date: 01/29/2021   PT End of Session - 01/29/21 1020    Visit Number 31    Number of Visits 32    Date for PT Re-Evaluation 02/17/21    Authorization Type use KX, 12 more visits authorized until 03/13/21    Authorization - Visit Number 7    Authorization - Number of Visits 12    Progress Note Due on Visit 40    PT Start Time 1014    PT Stop Time 1101    PT Time Calculation (min) 47 min    Activity Tolerance Patient tolerated treatment well    Behavior During Therapy [facility] for tasks assessed/performed           Past Medical History:  Diagnosis Date  . Anxiety   . Basal cell carcinoma   . Chronic rhinitis   . Clotting disorder (HCC)    right leg   . Depression   . Diverticulosis   . Elevated LFTs   . History of hypertension   . Hypercholesterolemia   . Sleep apnea     Past Surgical History:  Procedure Laterality Date  . COLONOSCOPY    . WRIST SURGERY  01/24/2003   Right- ORIF: Dr. Amedeo Plenty    There were no vitals filed for this visit.   Subjective Assessment - 01/29/21 1018    Subjective Patient is having some low back soreness today.    Pertinent History lumbar surgery 07/09/20    Currently in Pain? No/denies    Pain Onset More than a month ago                             [facility] Adult PT Treatment/Exercise - 01/29/21 0001      Lumbar Exercises: Stretches   Single Knee to Chest Stretch 2 reps;20 seconds;Left;Right    Lower Trunk Rotation 5 reps;10 seconds      Lumbar Exercises: Aerobic   Recumbent Bike 8 min L5      Lumbar Exercises: Machines for Strengthening   Cybex Knee Extension 35 lbs 3X10 bilat push    Cybex Knee Flexion 45 lbs 3x10     Leg Press Lt leg only 2x10, 87 lbs 1x10      Lumbar Exercises: Standing   Heel Raises 20 reps    Other Standing Lumbar Exercises red band trunk rotation with arms extended 10x each side      Lumbar Exercises: Supine   Bridge 20 reps;2 seconds      Ankle Exercises: Standing   Rocker Board 5 minutes   A/P tapping with 5 sec DF stretch, balance with slight UE   Other Standing Ankle Exercises tandem stance 20 sec bilat x4, airexcone tapping 15x 6'' step,    Other Standing Ankle Exercises marching with green tband focus on DF 2x10      Ankle Exercises: Supine   T-Band green Tband DF/PF 15 reps each                    PT Short Term Goals - 10/29/20 0855      PT SHORT TERM GOAL #1   Title Pt will be I and compliant with HEP.  Time 4    Period Weeks    Status Achieved    Target Date 10/13/20      PT SHORT TERM GOAL #2   Title Pt will reduce overall back pain >25%    Baseline more numbness than pain now    Time 4    Period Weeks    Status Achieved             PT Long Term Goals - 01/26/21 1426      PT LONG TERM GOAL #1   Title Pt will reduce overall pain >50%    Baseline patient believe this goal has been met with current pain management    Time 12    Period Weeks    Status Achieved      PT LONG TERM GOAL #2   Title Patient will demonstrate independent use of home exercise program progression to facilitate ability to maintain/progress functional gains from skilled physical therapy services.    Baseline progressed today    Time 12    Period Weeks    Status On-going      PT LONG TERM GOAL #3   Title Pt will improve lumbar ROM to [facility]    Baseline now met    Time 12    Period Weeks    Status Achieved      PT LONG TERM GOAL #4   Title Pt will be able to ambulate community distances without AD and return to normal ADLS    Baseline can amblate community distances now without AD but still not back to all his normal ADLs    Time 12    Period Weeks     Status On-going      PT LONG TERM GOAL #5   Title [name] 4+ throughout    Baseline still with Lt ankle weakness 3+ to 4    Time 12    Period Weeks    Status On-going                 Plan - 01/29/21 1103    Clinical Impression Statement Continued LE strengthening with more focus on lumbar stretching today due to patient soreness. Advised patient monitor soreness over the weekend. Next visit plan to reassess and submit recertification for additional skilled PT in order to meet patient's goals for strength and stability.    Examination-Activity Limitations Bend;Dressing;Lift;Stand;Stairs;Squat;Sleep;Locomotion Level    Examination-Participation Restrictions Cleaning;Driving;Laundry;Yard Work;Shop    Stability/Clinical Decision Making Evolving/Moderate complexity    Rehab Potential Good    PT Frequency 2x / week    PT Duration 12 weeks   6-12 weeks   PT Treatment/Interventions ADLs/Self Care Home Management;Cryotherapy;Barrister's clerk;Therapeutic activities;Therapeutic exercise;Neuromuscular re-education;Manual techniques;Passive range of motion;Taping;Spinal Manipulations;Joint Manipulations;Dry needling;Balance training    PT Next Visit Plan recert today, FOTO score, reassess goals, functional activity    PT Home Exercise Plan Access Code: [email or web address], added ankle 4 way    Consulted and Agree with Plan of Care Patient           Patient will benefit from skilled therapeutic intervention in order to improve the following deficits and impairments:  Abnormal gait,Decreased activity tolerance,Decreased balance,Decreased endurance,Decreased coordination,Decreased mobility,Decreased range of motion,Decreased strength,Difficulty walking,Increased muscle spasms,Pain,Improper body mechanics  Visit Diagnosis: Bilateral low back pain with left-sided sciatica, unspecified chronicity  Muscle weakness  (generalized)  Localized edema  Other abnormalities of gait and mobility  Stiffness of right shoulder, not elsewhere classified  Right shoulder pain,  unspecified chronicity     Problem List Patient Active Problem List   Diagnosis Date Noted  . ED (erectile dysfunction) 08/09/2012  . Hypercholesterolemia   . Chronic rhinitis   . Hyperlipidemia 02/29/2012    Glenetta Hew, SPT 01/29/2021, 11:10 AM   During this treatment session, this physical therapist was present, participating in and directing the treatment.   This note has been reviewed and this clinician agrees with the information provided.  Elsie Ra, PT, DPT 01/29/21 1:49 PM   [facility] [address] [place], [place], [address] Phone: [phone]   Fax:  [phone]  Name: Jonathan Bridges MRN: [id number] Date of Birth: [birth year]

## 2021-02-02 ENCOUNTER — Encounter: Payer: Medicare PPO | Admitting: Physical Therapy

## 2021-02-03 ENCOUNTER — Other Ambulatory Visit: Payer: Self-pay

## 2021-02-03 ENCOUNTER — Ambulatory Visit: Payer: Medicare PPO | Admitting: Physical Therapy

## 2021-02-03 DIAGNOSIS — R2689 Other abnormalities of gait and mobility: Secondary | ICD-10-CM | POA: Diagnosis not present

## 2021-02-03 DIAGNOSIS — M6281 Muscle weakness (generalized): Secondary | ICD-10-CM | POA: Diagnosis not present

## 2021-02-03 DIAGNOSIS — M5442 Lumbago with sciatica, left side: Secondary | ICD-10-CM

## 2021-02-03 DIAGNOSIS — R600 Localized edema: Secondary | ICD-10-CM

## 2021-02-03 NOTE — Therapy (Signed)
[facility] [address] [place], [place], [address] Phone: [phone]   Fax:  [phone]  Physical Therapy Treatment/Recert/Progress note Progress Note reporting period 01/06/21 to 02/03/21  See below for objective and subjective measurements relating to patients progress with PT.   Patient Details  Name: Jonathan Bridges MRN: [id number] Date of Birth: 05/17/1946 Referring Provider (PT): Ostergard, Thomas A, MD   Encounter Date: 02/03/2021   PT End of Session - 02/03/21 1009    Visit Number 32    Number of Visits 50    Date for PT Re-Evaluation 04/12/21    Authorization Type resubmitting for more [employer] visits on 2/22    Authorization - Visit Number 8    Authorization - Number of Visits 12    Progress Note Due on Visit 42    PT Start Time 0930    PT Stop Time 1015    PT Time Calculation (min) 45 min    Activity Tolerance Patient tolerated treatment well    Behavior During Therapy [facility] for tasks assessed/performed           Past Medical History:  Diagnosis Date  . Anxiety   . Basal cell carcinoma   . Chronic rhinitis   . Clotting disorder (HCC)    right leg   . Depression   . Diverticulosis   . Elevated LFTs   . History of hypertension   . Hypercholesterolemia   . Sleep apnea     Past Surgical History:  Procedure Laterality Date  . COLONOSCOPY    . WRIST SURGERY  01/24/2003   Right- ORIF: Dr. Gramig    There were no vitals filed for this visit.   Subjective Assessment - 02/03/21 1008    Subjective Patient is having some low back soreness today after driving to and from [place] this weekend. He feels he needs to continue with PT.    Pertinent History lumbar surgery 07/09/20    Pain Onset More than a month ago              [facility] PT Assessment - 02/03/21 0001      Assessment   Medical Diagnosis lumbar radiculopathy, post op disc herniation operation    Referring Provider (PT) Ostergard, Thomas A, MD       Functional Tests   Functional tests Single leg stance      Single Leg Stance   Comments Lt leg 6 sec avg, Rt leg 30 sec avg      AROM   Lumbar Flexion WNL    Lumbar Extension 75%    Lumbar - Right Rotation 75%    Lumbar - Left Rotation 75%      Strength   Left Knee Flexion 4+/5    Left Knee Extension 4+/5    Left Ankle Dorsiflexion 4/5    Left Ankle Plantar Flexion 4+/5    Left Ankle Inversion 4-/5    Left Ankle Eversion 3+/5      Standardized Balance Assessment   Standardized Balance Assessment Five Times Sit to Stand    Five times sit to stand comments  9.5 sec            [facility] Adult PT Treatment/Exercise - 02/03/21 0001      Neuro Re-ed    Neuro Re-ed Details  sidestepping over cones, weaving through cones with hips facing anterior, SLS X 5 reps on Lt with 6 sec avg      Lumbar Exercises: Aerobic   Recumbent Bike   8 min L5      Lumbar Exercises: Machines for Strengthening   Cybex Knee Extension 35 lbs 3X10 bilat push    Cybex Knee Flexion 45 lbs 3x10    Leg Press Lt leg only 2x10, 87 lbs then bilat press at 150 lbs 2 sets of 10      Lumbar Exercises: Standing   Heel Raises Limitations heel toe raises X20 ea  holding 2-3 sec with UE suppport      Lumbar Exercises: Seated   Other Seated Lumbar Exercises in long sitting red band X 20 reps DF, INV,EV, then blue band for PF  x20 reps                    PT Short Term Goals - 10/29/20 0855      PT SHORT TERM GOAL #1   Title Pt will be I and compliant with HEP.    Time 4    Period Weeks    Status Achieved    Target Date 10/13/20      PT SHORT TERM GOAL #2   Title Pt will reduce overall back pain >25%    Baseline more numbness than pain now    Time 4    Period Weeks    Status Achieved             PT Long Term Goals - 02/03/21 1023      PT LONG TERM GOAL #1   Title Pt will reduce overall pain >50%    Baseline patient believe this goal has been met with current pain management    Time 12     Period Weeks    Status Achieved      PT LONG TERM GOAL #2   Title Patient will demonstrate independent use of home exercise program progression to facilitate ability to maintain/progress functional gains from skilled physical therapy services.    Baseline progressed today    Time 12    Period Weeks    Status On-going      PT LONG TERM GOAL #3   Title Pt will improve lumbar ROM to [facility]    Baseline now met    Time 12    Period Weeks    Status Achieved      PT LONG TERM GOAL #4   Title Pt will be able to ambulate community distances without AD and return to normal ADLS    Baseline can amblate community distances now without AD but still not back to all his normal ADLs    Time 12    Period Weeks    Status On-going      PT LONG TERM GOAL #5   Title [name] 4+ throughout    Baseline still with Lt ankle weakness 3+ to 4    Time 12    Period Weeks    Status On-going                 Plan - 02/03/21 1021    Clinical Impression Statement Recert and progress note show improvements in overall leg strength and balance and pain however still with deficits in these areas. Paticularly with Lt ankle weakness and SLS balance. PT will continue to work to improve these functional deficits and continued skilled PT is indicated.    Examination-Activity Limitations Bend;Dressing;Lift;Stand;Stairs;Squat;Sleep;Locomotion Level    Examination-Participation Restrictions Cleaning;Driving;Laundry;Yard Work;Shop    Stability/Clinical Decision Making Evolving/Moderate complexity    Rehab Potential Good  PT Frequency 2x / week    PT Duration 12 weeks   6-12 weeks   PT Treatment/Interventions ADLs/Self Care Home Management;Cryotherapy;Electrical Stimulation;Moist Heat;Traction;Ultrasound;Stair training;Therapeutic activities;Therapeutic exercise;Neuromuscular re-education;Manual techniques;Passive range of motion;Taping;Spinal Manipulations;Joint Manipulations;Dry  needling;Balance training    PT Next Visit Plan continue to progress ankle strength, leg strenght and balance    PT Home Exercise Plan Access Code: [email or web address], added ankle 4 way    Consulted and Agree with Plan of Care Patient           Patient will benefit from skilled therapeutic intervention in order to improve the following deficits and impairments:  Abnormal gait,Decreased activity tolerance,Decreased balance,Decreased endurance,Decreased coordination,Decreased mobility,Decreased range of motion,Decreased strength,Difficulty walking,Increased muscle spasms,Pain,Improper body mechanics  Visit Diagnosis: Bilateral low back pain with left-sided sciatica, unspecified chronicity  Muscle weakness (generalized)  Localized edema  Other abnormalities of gait and mobility     Problem List Patient Active Problem List   Diagnosis Date Noted  . ED (erectile dysfunction) 08/09/2012  . Hypercholesterolemia   . Chronic rhinitis   . Hyperlipidemia 02/29/2012    Brian R Nelson, PT,DPT 02/03/2021, 10:23 AM  [facility] [address] [place], [place], [address] Phone: [phone]   Fax:  [phone]  Name: Jonathan Bridges MRN: [id number] Date of Birth: 07/25/1946   

## 2021-02-05 ENCOUNTER — Ambulatory Visit: Payer: Medicare PPO | Admitting: Physical Therapy

## 2021-02-05 ENCOUNTER — Other Ambulatory Visit: Payer: Self-pay

## 2021-02-05 DIAGNOSIS — R2689 Other abnormalities of gait and mobility: Secondary | ICD-10-CM

## 2021-02-05 DIAGNOSIS — R600 Localized edema: Secondary | ICD-10-CM | POA: Diagnosis not present

## 2021-02-05 DIAGNOSIS — M5442 Lumbago with sciatica, left side: Secondary | ICD-10-CM

## 2021-02-05 DIAGNOSIS — M6281 Muscle weakness (generalized): Secondary | ICD-10-CM | POA: Diagnosis not present

## 2021-02-05 NOTE — Therapy (Signed)
[facility] [address] [place], [place], [address] Phone: [phone]   Fax:  [phone]  Physical Therapy Treatment  Patient Details  Name: Jonathan Bridges MRN: [id number] Date of Birth: Aug 03, 1946 Referring Provider (PT): Judith Part, MD   Encounter Date: 02/05/2021   PT End of Session - 02/05/21 0929    Visit Number 33    Number of Visits 30    Date for PT Re-Evaluation 04/12/21    Authorization Type resubmitting for more [employer] visits on 2/22    Authorization - Visit Number 9    Authorization - Number of Visits 12    Progress Note Due on Visit 13    PT Start Time 0843    PT Stop Time 0929    PT Time Calculation (min) 46 min    Activity Tolerance Patient tolerated treatment well    Behavior During Therapy [facility] for tasks assessed/performed           Past Medical History:  Diagnosis Date   Anxiety    Basal cell carcinoma    Chronic rhinitis    Clotting disorder ([place])    right leg    Depression    Diverticulosis    Elevated LFTs    History of hypertension    Hypercholesterolemia    Sleep apnea     Past Surgical History:  Procedure Laterality Date   COLONOSCOPY     WRIST SURGERY  01/24/2003   Right- ORIF: Dr. Amedeo Plenty    There were no vitals filed for this visit.   Subjective Assessment - 02/05/21 0847    Subjective Patient is having some soreness from last visit.    Pertinent History lumbar surgery 07/09/20    Currently in Pain? No/denies    Pain Onset More than a month ago                             [facility] Adult PT Treatment/Exercise - 02/05/21 0001      Lumbar Exercises: Aerobic   Recumbent Bike 8 min L5      Lumbar Exercises: Machines for Strengthening   Cybex Knee Extension 35 lbs 3X10 bilat push    Cybex Knee Flexion 45 lbs 3x10    Leg Press Lt leg only 2x10, 87 lbs then bilat press at 150 lbs 2 sets of 10      Lumbar Exercises: Standing   Heel Raises 15 reps   slight  UE     Lumbar Exercises: Seated   Sit to Stand 15 reps   with 2'' block under L foot     Ankle Exercises: Standing   Other Standing Ankle Exercises airex cone taps 3 directions with light UE support 10 each direction, bosu mini lunges 10x each side, bosu step over 15x with light UE    Other Standing Ankle Exercises lateral obstacle course step over 6'' and 4'' plus airex alt. tapping 4 laps      Ankle Exercises: Sidelying   Other Sidelying Ankle Exercises PF/DF with red inversion 20x, PF/DF with red eversion 20x                    PT Short Term Goals - 10/29/20 0855      PT SHORT TERM GOAL #1   Title Pt will be I and compliant with HEP.    Time 4    Period Weeks    Status Achieved    Target  Date 10/13/20      PT SHORT TERM GOAL #2   Title Pt will reduce overall back pain >25%    Baseline more numbness than pain now    Time 4    Period Weeks    Status Achieved             PT Long Term Goals - 02/03/21 1023      PT LONG TERM GOAL #1   Title Pt will reduce overall pain >50%    Baseline patient believe this goal has been met with current pain management    Time 12    Period Weeks    Status Achieved      PT LONG TERM GOAL #2   Title Patient will demonstrate independent use of home exercise program progression to facilitate ability to maintain/progress functional gains from skilled physical therapy services.    Baseline progressed today    Time 12    Period Weeks    Status On-going      PT LONG TERM GOAL #3   Title Pt will improve lumbar ROM to [facility]    Baseline now met    Time 12    Period Weeks    Status Achieved      PT LONG TERM GOAL #4   Title Pt will be able to ambulate community distances without AD and return to normal ADLS    Baseline can amblate community distances now without AD but still not back to all his normal ADLs    Time 12    Period Weeks    Status On-going      PT LONG TERM GOAL #5   Title [name] 4+  throughout    Baseline still with Lt ankle weakness 3+ to 4    Time 12    Period Weeks    Status On-going                 Plan - 02/05/21 0935    Clinical Impression Statement Patient continues to be able to tolerate dynamic balance activities but still struggles with weight shifting to L side of unstable surfaces. Continue skilled PT to improve strength within ankle range for increased stability.    Examination-Activity Limitations Bend;Dressing;Lift;Stand;Stairs;Squat;Sleep;Locomotion Level    Examination-Participation Restrictions Cleaning;Driving;Laundry;Yard Work;Shop    Stability/Clinical Decision Making Evolving/Moderate complexity    Rehab Potential Good    PT Frequency 2x / week    PT Duration 12 weeks   6-12 weeks   PT Treatment/Interventions ADLs/Self Care Home Management;Cryotherapy;Barrister's clerk;Therapeutic activities;Therapeutic exercise;Neuromuscular re-education;Manual techniques;Passive range of motion;Taping;Spinal Manipulations;Joint Manipulations;Dry needling;Balance training    PT Next Visit Plan dynamic obstacles and weight shifting, PF/DF range and strength    PT Home Exercise Plan Access Code: [email or web address], added ankle 4 way    Consulted and Agree with Plan of Care Patient           Patient will benefit from skilled therapeutic intervention in order to improve the following deficits and impairments:  Abnormal gait,Decreased activity tolerance,Decreased balance,Decreased endurance,Decreased coordination,Decreased mobility,Decreased range of motion,Decreased strength,Difficulty walking,Increased muscle spasms,Pain,Improper body mechanics  Visit Diagnosis: Bilateral low back pain with left-sided sciatica, unspecified chronicity  Muscle weakness (generalized)  Localized edema  Other abnormalities of gait and mobility     Problem List Patient Active Problem List   Diagnosis Date Noted   ED  (erectile dysfunction) 08/09/2012   Hypercholesterolemia    Chronic rhinitis    Hyperlipidemia 02/29/2012  Glenetta Hew, SPT 02/05/2021, 9:39 AM   During this treatment session, this physical therapist was present, participating in and directing the treatment.   This note has been reviewed and this clinician agrees with the information provided.  Elsie Ra, PT, DPT 02/05/21 10:14 AM  [facility] [address] [place], [place], [address] Phone: [phone]   Fax:  [phone]  Name: Jonathan Bridges MRN: [id number] Date of Birth: 05/27/1946

## 2021-02-09 ENCOUNTER — Ambulatory Visit: Payer: Medicare PPO | Admitting: Physical Therapy

## 2021-02-09 ENCOUNTER — Other Ambulatory Visit: Payer: Self-pay

## 2021-02-09 DIAGNOSIS — R600 Localized edema: Secondary | ICD-10-CM | POA: Diagnosis not present

## 2021-02-09 DIAGNOSIS — R2689 Other abnormalities of gait and mobility: Secondary | ICD-10-CM | POA: Diagnosis not present

## 2021-02-09 DIAGNOSIS — M6281 Muscle weakness (generalized): Secondary | ICD-10-CM | POA: Diagnosis not present

## 2021-02-09 DIAGNOSIS — M5442 Lumbago with sciatica, left side: Secondary | ICD-10-CM | POA: Diagnosis not present

## 2021-02-09 NOTE — Therapy (Signed)
[facility] [address] [place], [place], [address] Phone: [phone]   Fax:  [phone]  Physical Therapy Treatment  Patient Details  Name: Jonathan Bridges MRN: [id number] Date of Birth: 10-29-1946 Referring Provider (PT): Judith Part, MD   Encounter Date: 02/09/2021   PT End of Session - 02/09/21 0941    Visit Number 34    Number of Visits 8    Date for PT Re-Evaluation 04/12/21    Authorization Type resubmitting for more [employer] visits on 2/22    Authorization - Visit Number 10    Authorization - Number of Visits 12    Progress Note Due on Visit 79    PT Start Time 0846    PT Stop Time 0935    PT Time Calculation (min) 49 min    Activity Tolerance Patient tolerated treatment well    Behavior During Therapy [facility] for tasks assessed/performed           Past Medical History:  Diagnosis Date   Anxiety    Basal cell carcinoma    Chronic rhinitis    Clotting disorder ([place])    right leg    Depression    Diverticulosis    Elevated LFTs    History of hypertension    Hypercholesterolemia    Sleep apnea     Past Surgical History:  Procedure Laterality Date   COLONOSCOPY     WRIST SURGERY  01/24/2003   Right- ORIF: Dr. Amedeo Plenty    There were no vitals filed for this visit.   Subjective Assessment - 02/09/21 0940    Subjective Patient reports no changes from last visit, he notes no soreness or pain.    Pertinent History lumbar surgery 07/09/20    Currently in Pain? No/denies    Pain Onset More than a month ago              [facility] Adult PT Treatment/Exercise - 02/09/21 0001      Lumbar Exercises: Aerobic   Recumbent Bike 8 min L6      Lumbar Exercises: Machines for Strengthening   Cybex Knee Extension 35 lbs 3X10 bilat push    Cybex Knee Flexion 45 lbs 3x10    Leg Press Lt leg only 2x10, 87 lbs then bilat press at 150 lbs 3x10      Lumbar Exercises: Standing   Heel Raises 20 reps   slow eccentric  lowering, 2 fingers for balance     Ankle Exercises: Standing   Other Standing Ankle Exercises lateral stepping with green band around toes 10x, marching with green band around toes 10x, cone taps alt. standing on airex with slight UE 15x, L SLS on airex tapping 6'' step 15x    Other Standing Ankle Exercises lateral step up and over 6'' with airex 15x with slight UE, 6'' forward step up on airex with 5 sec hold 20x, bosu PF isometric 5 sec hold x10, bosu step up and over with UE 15x      Ankle Exercises: Stretches   Slant Board Stretch 30 seconds;2 reps   gastroc/soleus                   PT Short Term Goals - 10/29/20 0855      PT SHORT TERM GOAL #1   Title Pt will be I and compliant with HEP.    Time 4    Period Weeks    Status Achieved    Target Date 10/13/20  PT SHORT TERM GOAL #2   Title Pt will reduce overall back pain >25%    Baseline more numbness than pain now    Time 4    Period Weeks    Status Achieved             PT Long Term Goals - 02/03/21 1023      PT LONG TERM GOAL #1   Title Pt will reduce overall pain >50%    Baseline patient believe this goal has been met with current pain management    Time 12    Period Weeks    Status Achieved      PT LONG TERM GOAL #2   Title Patient will demonstrate independent use of home exercise program progression to facilitate ability to maintain/progress functional gains from skilled physical therapy services.    Baseline progressed today    Time 12    Period Weeks    Status On-going      PT LONG TERM GOAL #3   Title Pt will improve lumbar ROM to [facility]    Baseline now met    Time 12    Period Weeks    Status Achieved      PT LONG TERM GOAL #4   Title Pt will be able to ambulate community distances without AD and return to normal ADLS    Baseline can amblate community distances now without AD but still not back to all his normal ADLs    Time 12    Period Weeks    Status On-going      PT LONG TERM  GOAL #5   Title [name] 4+ throughout    Baseline still with Lt ankle weakness 3+ to 4    Time 12    Period Weeks    Status On-going                 Plan - 02/09/21 0943    Clinical Impression Statement Patient is improving in his ability to navigate unstable surfaces but still struggles with maintaining static SLS balance more than 3 seconds. PF strength is making slow progress but requires continued strengthening for better function of daily life.    Examination-Activity Limitations Bend;Dressing;Lift;Stand;Stairs;Squat;Sleep;Locomotion Level    Examination-Participation Restrictions Cleaning;Driving;Laundry;Yard Work;Shop    Stability/Clinical Decision Making Evolving/Moderate complexity    Rehab Potential Good    PT Frequency 2x / week    PT Duration 12 weeks   6-12 weeks   PT Treatment/Interventions ADLs/Self Care Home Management;Cryotherapy;Barrister's clerk;Therapeutic activities;Therapeutic exercise;Neuromuscular re-education;Manual techniques;Passive range of motion;Taping;Spinal Manipulations;Joint Manipulations;Dry needling;Balance training    PT Next Visit Plan static balance focus, PF eccentrics, ankle 4 way with slow lowering    PT Home Exercise Plan Access Code: [email or web address], added ankle 4 way    Consulted and Agree with Plan of Care Patient           Patient will benefit from skilled therapeutic intervention in order to improve the following deficits and impairments:  Abnormal gait,Decreased activity tolerance,Decreased balance,Decreased endurance,Decreased coordination,Decreased mobility,Decreased range of motion,Decreased strength,Difficulty walking,Increased muscle spasms,Pain,Improper body mechanics  Visit Diagnosis: Bilateral low back pain with left-sided sciatica, unspecified chronicity  Muscle weakness (generalized)  Localized edema  Other abnormalities of gait and  mobility     Problem List Patient Active Problem List   Diagnosis Date Noted   ED (erectile dysfunction) 08/09/2012   Hypercholesterolemia    Chronic rhinitis    Hyperlipidemia 02/29/2012  Glenetta Hew, SPT 02/09/2021, 9:47 AM   During this treatment session, this physical therapist was present, participating in and directing the treatment.   This note has been reviewed and this clinician agrees with the information provided.  Elsie Ra, PT, DPT 02/09/21 1:55 PM   [facility] [address] [place], [place], [address] Phone: [phone]   Fax:  [phone]  Name: JAZIR NEWEY MRN: [id number] Date of Birth: 1946-03-17

## 2021-02-12 ENCOUNTER — Ambulatory Visit: Payer: Medicare PPO | Admitting: Physical Therapy

## 2021-02-12 ENCOUNTER — Encounter: Payer: Self-pay | Admitting: Physical Therapy

## 2021-02-12 ENCOUNTER — Other Ambulatory Visit: Payer: Self-pay

## 2021-02-12 DIAGNOSIS — M5442 Lumbago with sciatica, left side: Secondary | ICD-10-CM

## 2021-02-12 DIAGNOSIS — M6281 Muscle weakness (generalized): Secondary | ICD-10-CM | POA: Diagnosis not present

## 2021-02-12 DIAGNOSIS — R2689 Other abnormalities of gait and mobility: Secondary | ICD-10-CM

## 2021-02-12 DIAGNOSIS — R600 Localized edema: Secondary | ICD-10-CM

## 2021-02-12 NOTE — Therapy (Signed)
[facility] [place], [place], [address] Phone: [phone]   Fax:  [phone]  Physical Therapy Treatment  Patient Details  Name: Jonathan Bridges MRN: [id number] Date of Birth: 1946/11/11 Referring Provider (PT): Judith Part, MD   Encounter Date: 02/12/2021   PT End of Session - 02/12/21 0852    Visit Number 35    Number of Visits 50    Date for PT Re-Evaluation 04/12/21    Authorization Type resubmitting for more [employer] visits on 2/22    Authorization - Visit Number 11    Authorization - Number of Visits 12    Progress Note Due on Visit 55    PT Start Time 0845    PT Stop Time 0933    PT Time Calculation (min) 48 min    Activity Tolerance Patient tolerated treatment well    Behavior During Therapy [facility] for tasks assessed/performed           Past Medical History:  Diagnosis Date  . Anxiety   . Basal cell carcinoma   . Chronic rhinitis   . Clotting disorder ([facility])    right leg   . Depression   . Diverticulosis   . Elevated LFTs   . History of hypertension   . Hypercholesterolemia   . Sleep apnea     Past Surgical History:  Procedure Laterality Date  . COLONOSCOPY    . WRIST SURGERY  01/24/2003   Right- ORIF: Dr. Amedeo Plenty    There were no vitals filed for this visit.   Subjective Assessment - 02/12/21 0847    Subjective Patient reports usual stiffness and took tylenol this morning.    Pertinent History lumbar surgery 07/09/20    Currently in Pain? No/denies    Pain Score 0-No pain    Pain Location Back    Pain Descriptors / Indicators Aching    Pain Type Surgical pain    Pain Onset More than a month ago            [facility] Adult PT Treatment/Exercise - 02/12/21 0001      Lumbar Exercises: Aerobic   Recumbent Bike 8 min L6      Lumbar Exercises: Machines for Strengthening   Cybex Knee Extension 35 lbs 3X10 bilat push    Cybex Knee Flexion 45 lbs 3x10    Leg Press Lt leg only 3x10, 25 lbs SL calf  raises 2x10, DL calf raises 50lbs 10x, plyometric jumping DL 50lbs 10x, 31lbs shuttle plyometric SL jumping 10x each leg with verbal cueing for correct form      Ankle Exercises: Stretches   Slant Board Stretch 30 seconds;2 reps      Ankle Exercises: Standing   Rocker Board 4 minutes   A/P taps 20x, A/P balance DL 30 secx2, SLx30 sec, CCW 10x, CW 10x   Other Standing Ankle Exercises standing on airex alt tapping cones on 6'' step 15x each, alt marching with tapping anterior and medial cones 15x each                    PT Short Term Goals - 10/29/20 0855      PT SHORT TERM GOAL #1   Title Pt will be I and compliant with HEP.    Time 4    Period Weeks    Status Achieved    Target Date 10/13/20      PT SHORT TERM GOAL #2   Title Pt will reduce overall back  pain >25%    Baseline more numbness than pain now    Time 4    Period Weeks    Status Achieved             PT Long Term Goals - 02/03/21 1023      PT LONG TERM GOAL #1   Title Pt will reduce overall pain >50%    Baseline patient believe this goal has been met with current pain management    Time 12    Period Weeks    Status Achieved      PT LONG TERM GOAL #2   Title Patient will demonstrate independent use of home exercise program progression to facilitate ability to maintain/progress functional gains from skilled physical therapy services.    Baseline progressed today    Time 12    Period Weeks    Status On-going      PT LONG TERM GOAL #3   Title Pt will improve lumbar ROM to [facility]    Baseline now met    Time 12    Period Weeks    Status Achieved      PT LONG TERM GOAL #4   Title Pt will be able to ambulate community distances without AD and return to normal ADLS    Baseline can amblate community distances now without AD but still not back to all his normal ADLs    Time 12    Period Weeks    Status On-going      PT LONG TERM GOAL #5   Title [name] 4+ throughout     Baseline still with Lt ankle weakness 3+ to 4    Time 12    Period Weeks    Status On-going                 Plan - 02/12/21 0936    Clinical Impression Statement Patient was able to try plyometric activities to work on PF power and controlled eccentric landing on leg press and tolerated well. Continue to progress his ability to challenge and build PF strength for improved and normalized gait and daily activity.    Examination-Activity Limitations Bend;Dressing;Lift;Stand;Stairs;Squat;Sleep;Locomotion Level    Examination-Participation Restrictions Cleaning;Driving;Laundry;Yard Work;Shop    Stability/Clinical Decision Making Evolving/Moderate complexity    Rehab Potential Good    PT Frequency 2x / week    PT Duration 12 weeks   6-12 weeks   PT Treatment/Interventions ADLs/Self Care Home Management;Cryotherapy;Barrister's clerk;Therapeutic activities;Therapeutic exercise;Neuromuscular re-education;Manual techniques;Passive range of motion;Taping;Spinal Manipulations;Joint Manipulations;Dry needling;Balance training    PT Next Visit Plan PF eccentrics, ankle 4 way with slow lowering, plyometric jumping on leg press    PT Home Exercise Plan Access Code: [email or web address], added ankle 4 way    Consulted and Agree with Plan of Care Patient           Patient will benefit from skilled therapeutic intervention in order to improve the following deficits and impairments:  Abnormal gait,Decreased activity tolerance,Decreased balance,Decreased endurance,Decreased coordination,Decreased mobility,Decreased range of motion,Decreased strength,Difficulty walking,Increased muscle spasms,Pain,Improper body mechanics  Visit Diagnosis: Bilateral low back pain with left-sided sciatica, unspecified chronicity  Localized edema  Muscle weakness (generalized)  Other abnormalities of gait and mobility     Problem List Patient Active Problem List    Diagnosis Date Noted  . ED (erectile dysfunction) 08/09/2012  . Hypercholesterolemia   . Chronic rhinitis   . Hyperlipidemia 02/29/2012    Glenetta Hew, SPT 02/12/2021, 9:39 AM  During this treatment session, this physical therapist was present, participating in and directing the treatment.   This note has been reviewed and this clinician agrees with the information provided.  Elsie Ra, PT, DPT 02/12/21 11:40 AM   [facility] Physical Therapy [address] [place], [place], [address] Phone: [phone]   Fax:  [phone]  Name: XYLER TERPENING MRN: [id number] Date of Birth: Jan 31, 1946

## 2021-02-17 ENCOUNTER — Encounter: Payer: Self-pay | Admitting: Physical Therapy

## 2021-02-17 ENCOUNTER — Ambulatory Visit: Payer: Medicare PPO | Admitting: Physical Therapy

## 2021-02-17 ENCOUNTER — Other Ambulatory Visit: Payer: Self-pay

## 2021-02-17 DIAGNOSIS — M5442 Lumbago with sciatica, left side: Secondary | ICD-10-CM

## 2021-02-17 DIAGNOSIS — M6281 Muscle weakness (generalized): Secondary | ICD-10-CM

## 2021-02-17 DIAGNOSIS — R600 Localized edema: Secondary | ICD-10-CM | POA: Diagnosis not present

## 2021-02-17 NOTE — Therapy (Signed)
[facility] [address] [place], [place], [address] Phone: [phone]   Fax:  [phone]  Physical Therapy Treatment Referring diagnosis? m54.42  Treatment diagnosis? (if different than referring diagnosis) same What was this (referring dx) caused by? [x]  Surgery []  Fall []  Ongoing issue []  Arthritis []  Other: ____________  Laterality: []  Rt []  Lt [x]  Both  Check all possible CPT codes:      [x]  97110 (Therapeutic Exercise)  []  92507 (SLP Treatment)  [x]  97112 (Neuro Re-ed)   []  92526 (Swallowing Treatment)   [x]  97116 (Gait Training)   []  [id number] (Cognitive Training, 1st 15 minutes) [x]  97140 (Manual Therapy)   []  97130 (Cognitive Training, each add'l 15 minutes)  [x]  97530 (Therapeutic Activities)  []  Other, List CPT Code ____________    []  97535 (Self Care)       []  All codes above (97110 - 97535)  []  97012 (Mechanical Traction)  []  97014 (E-stim Unattended)  []  97032 (E-stim manual)  []  97033 (Ionto)  []  97035 (Ultrasound)  []  97760 (Orthotic Fit) []  97750 (Physical Performance Training) []  [id number] (Aquatic Therapy) []  97034 (Contrast Bath) []  [id number] (Paraffin) []  97597 (Wound Care 1st 20 sq cm) []  97598 (Wound Care each add'l 20 sq cm) []  97016 (Vasopneumatic Device) []  [id number] (Orthotic Training) []  [id number] (Prosthetic Training)   Patient Details  Name: Jonathan Bridges MRN: [id number] Date of Birth: 05-24-1946 Referring Provider (PT): Judith Part, MD   Encounter Date: 02/17/2021   PT End of Session - 02/17/21 0948    Visit Number 36    Number of Visits 50    Date for PT Re-Evaluation 04/12/21    Authorization Type resubmitting for more [employer] visits on 3/8    Authorization - Visit Number 12    Authorization - Number of Visits 12    Progress Note Due on Visit 45    PT Start Time 0845    PT Stop Time 0925    PT Time Calculation (min) 40 min    Activity Tolerance Patient tolerated treatment well    Behavior During  Therapy [facility] for tasks assessed/performed           Past Medical History:  Diagnosis Date  . Anxiety   . Basal cell carcinoma   . Chronic rhinitis   . Clotting disorder ([facility])    right leg   . Depression   . Diverticulosis   . Elevated LFTs   . History of hypertension   . Hypercholesterolemia   . Sleep apnea     Past Surgical History:  Procedure Laterality Date  . COLONOSCOPY    . WRIST SURGERY  01/24/2003   Right- ORIF: Dr. Amedeo Plenty    There were no vitals filed for this visit.   Subjective Assessment - 02/17/21 0858    Subjective He continues to report back/leg pain and stiffness first thing in the morning but then once he gets up and takes tylenol and moves around it improves. Overall he feels he is 80% back to full function since surgery.    Pertinent History lumbar surgery 07/09/20    Pain Onset More than a month ago              [facility] PT Assessment - 02/17/21 0001      Assessment   Medical Diagnosis lumbar radiculopathy, post op disc herniation operation    Referring Provider (PT) Judith Part, MD    Onset Date/Surgical Date 07/09/20      AROM  Lumbar Flexion WNL    Lumbar Extension 75%    Lumbar - Right Rotation WNL    Lumbar - Left Rotation WNL      Strength   Left Knee Flexion 4+/5    Left Knee Extension 4+/5    Left Ankle Dorsiflexion 4/5    Left Ankle Plantar Flexion 4+/5    Left Ankle Inversion 4/5    Left Ankle Eversion 3+/5                         [facility] Adult PT Treatment/Exercise - 02/17/21 0001      Neuro Re-ed    Neuro Re-ed Details  tandem walk fwd/retro on foam beam, sidestepping on foam beam, SLS, balance on bosu      Lumbar Exercises: Aerobic   Recumbent Bike 8 min L6      Lumbar Exercises: Machines for Strengthening   Leg Press 87 lbs Lt leg 3X10, then SL calf raises 25 lbs 2X10, DL calf raises 50 lbs 2X10. plyometric jumping DL 50lbs 10x, 31lbs shuttle plyometric SL jumping 10x each leg      Lumbar  Exercises: Standing   Other Standing Lumbar Exercises TRX rows and squats 2 sets of 10    Other Standing Lumbar Exercises 8 inch step ups with opposite leg march in bars no UE support X 15 reps                    PT Short Term Goals - 02/17/21 0920      PT SHORT TERM GOAL #1   Title Pt will be I and compliant with HEP.    Time 4    Period Weeks    Status Achieved    Target Date 10/13/20      PT SHORT TERM GOAL #2   Title Pt will reduce overall back pain >25%    Baseline more numbness than pain now    Time 4    Period Weeks    Status Achieved             PT Long Term Goals - 02/17/21 0920      PT LONG TERM GOAL #1   Title Pt will reduce overall pain >50%    Baseline patient believe this goal has been met with current pain management    Time 12    Period Weeks    Status Achieved      PT LONG TERM GOAL #2   Title Patient will demonstrate independent use of home exercise program progression to facilitate ability to maintain/progress functional gains from skilled physical therapy services.    Baseline progressed today    Time 12    Period Weeks    Status Achieved      PT LONG TERM GOAL #3   Title Pt will improve lumbar ROM to [employer] [facility]    Baseline now met    Time 12    Period Weeks    Status Achieved      PT LONG TERM GOAL #4   Title Pt will be able to ambulate community distances without AD and return to normal ADLS    Baseline can amblate community distances now without AD but still not back to all his normal ADLs    Time 12    Period Weeks    Status On-going      PT LONG TERM GOAL #5   Title [name] 4+ throughout  Baseline still with Lt ankle weakness 3+ to 4    Time 12    Period Weeks    Status On-going                 Plan - 02/17/21 0949    Clinical Impression Statement He has made good overall progress since PT but he still lacks Lt leg strength and ankle strength and overall balance. PT anticipates he will  need 12 more visits to try to return to his prior level of function from back surgery    Examination-Activity Limitations Bend;Dressing;Lift;Stand;Stairs;Squat;Sleep;Locomotion Level    Examination-Participation Restrictions Cleaning;Driving;Laundry;Yard Work;Shop    Stability/Clinical Decision Making Evolving/Moderate complexity    Rehab Potential Good    PT Frequency 2x / week    PT Duration 12 weeks   6-12 weeks   PT Treatment/Interventions ADLs/Self Care Home Management;Cryotherapy;Barrister's clerk;Therapeutic activities;Therapeutic exercise;Neuromuscular re-education;Manual techniques;Passive range of motion;Taping;Spinal Manipulations;Joint Manipulations;Dry needling;Balance training    PT Next Visit Plan PF eccentrics, ankle 4 way with slow lowering, plyometric jumping on leg press    PT Home Exercise Plan Access Code: [email or web address], added ankle 4 way    Consulted and Agree with Plan of Care Patient           Patient will benefit from skilled therapeutic intervention in order to improve the following deficits and impairments:  Abnormal gait,Decreased activity tolerance,Decreased balance,Decreased endurance,Decreased coordination,Decreased mobility,Decreased range of motion,Decreased strength,Difficulty walking,Increased muscle spasms,Pain,Improper body mechanics  Visit Diagnosis: Bilateral low back pain with left-sided sciatica, unspecified chronicity  Localized edema  Muscle weakness (generalized)     Problem List Patient Active Problem List   Diagnosis Date Noted  . ED (erectile dysfunction) 08/09/2012  . Hypercholesterolemia   . Chronic rhinitis   . Hyperlipidemia 02/29/2012    [name] 02/17/2021, 9:57 AM  [facility] Therapy [address] [place], [place], [address] Phone: [phone]   Fax:  [phone]  Name: Jonathan Bridges MRN: [id number] Date of Birth: 1946/07/08

## 2021-02-19 ENCOUNTER — Ambulatory Visit: Payer: Medicare PPO | Admitting: Physical Therapy

## 2021-02-19 ENCOUNTER — Encounter: Payer: Self-pay | Admitting: Physical Therapy

## 2021-02-19 ENCOUNTER — Other Ambulatory Visit: Payer: Self-pay

## 2021-02-19 DIAGNOSIS — M5442 Lumbago with sciatica, left side: Secondary | ICD-10-CM | POA: Diagnosis not present

## 2021-02-19 DIAGNOSIS — M6281 Muscle weakness (generalized): Secondary | ICD-10-CM

## 2021-02-19 DIAGNOSIS — R2689 Other abnormalities of gait and mobility: Secondary | ICD-10-CM

## 2021-02-19 DIAGNOSIS — R600 Localized edema: Secondary | ICD-10-CM | POA: Diagnosis not present

## 2021-02-19 NOTE — Therapy (Signed)
[place], [place], [address] Phone: [phone]   Fax:  [phone]  Physical Therapy Treatment  Patient Details  Name: Jonathan Bridges MRN: [id number] Date of Birth: 01-Jul-1946 Referring Provider (PT): Judith Part, MD   Encounter Date: 02/19/2021   PT End of Session - 02/19/21 0947    Visit Number 37    Number of Visits 50    Date for PT Re-Evaluation 04/12/21    Authorization Type 12 more visits approaved until 5/8    Authorization - Visit Number 1    Authorization - Number of Visits 12    Progress Note Due on Visit 31    PT Start Time 0845    PT Stop Time 0926    PT Time Calculation (min) 41 min    Activity Tolerance Patient tolerated treatment well    Behavior During Therapy [facility] for tasks assessed/performed           Past Medical History:  Diagnosis Date  . Anxiety   . Basal cell carcinoma   . Chronic rhinitis   . Clotting disorder (HCC)    right leg   . Depression   . Diverticulosis   . Elevated LFTs   . History of hypertension   . Hypercholesterolemia   . Sleep apnea     Past Surgical History:  Procedure Laterality Date  . COLONOSCOPY    . WRIST SURGERY  01/24/2003   Right- ORIF: Dr. Amedeo Plenty    There were no vitals filed for this visit.   Subjective Assessment - 02/19/21 0911    Subjective He has mild sorneess overall in his leg today.    Pertinent History lumbar surgery 07/09/20    Pain Onset More than a month ago           [facility] Adult PT Treatment/Exercise - 02/19/21 0001      Neuro Re-ed    Neuro Re-ed Details  rocker board 15 reps A-P, lateral, cirlces, SLS balance on foam with Rt leg hip abd abd ext kicks X 15 ea      Lumbar Exercises: Machines for Strengthening   Cybex Knee Extension 45 lbs 2X10    Cybex Knee Flexion 55 lbs 2X10    Leg Press 93 lbs Lt leg 3X10, then SL calf raises 25 lbs 2X10, DL calf raises 50 lbs 2X10. plyometric jumping DL 56lbs 15x, 31lbs shuttle  plyometric SL jumping 10x each leg      Lumbar Exercises: Standing   Other Standing Lumbar Exercises TRX rows and squats 2 sets of 10             PT Short Term Goals - 02/17/21 0920      PT SHORT TERM GOAL #1   Title Pt will be I and compliant with HEP.    Time 4    Period Weeks    Status Achieved    Target Date 10/13/20      PT SHORT TERM GOAL #2   Title Pt will reduce overall back pain >25%    Baseline more numbness than pain now    Time 4    Period Weeks    Status Achieved             PT Long Term Goals - 02/17/21 0920      PT LONG TERM GOAL #1   Title Pt will reduce overall pain >50%    Baseline patient believe this goal has been met with current pain management  Time 12    Period Weeks    Status Achieved      PT LONG TERM GOAL #2   Title Patient will demonstrate independent use of home exercise program progression to facilitate ability to maintain/progress functional gains from skilled physical therapy services.    Baseline progressed today    Time 12    Period Weeks    Status Achieved      PT LONG TERM GOAL #3   Title Pt will improve lumbar ROM to [facility]    Baseline now met    Time 12    Period Weeks    Status Achieved      PT LONG TERM GOAL #4   Title Pt will be able to ambulate community distances without AD and return to normal ADLS    Baseline can amblate community distances now without AD but still not back to all his normal ADLs    Time 12    Period Weeks    Status On-going      PT LONG TERM GOAL #5   Title [name] 4+ throughout    Baseline still with Lt ankle weakness 3+ to 4    Time 12    Period Weeks    Status On-going                 Plan - 02/19/21 0953    Clinical Impression Statement he is progressing well with overall hip/knee strength but Lt ankle strength continues to be limited and balance/coordination still not at baseline level of funciton. Continued to address this with PT and he had  good overall tolerance to session He has 12 more authorized visits to continue POC.    Examination-Activity Limitations Bend;Dressing;Lift;Stand;Stairs;Squat;Sleep;Locomotion Level    Examination-Participation Restrictions Cleaning;Driving;Laundry;Yard Work;Shop    Stability/Clinical Decision Making Evolving/Moderate complexity    Rehab Potential Good    PT Frequency 2x / week    PT Duration 12 weeks   6-12 weeks   PT Treatment/Interventions ADLs/Self Care Home Management;Cryotherapy;Barrister's clerk;Therapeutic activities;Therapeutic exercise;Neuromuscular re-education;Manual techniques;Passive range of motion;Taping;Spinal Manipulations;Joint Manipulations;Dry needling;Balance training    PT Next Visit Plan PF eccentrics, ankle 4 way with slow lowering, plyometric jumping on leg press    PT Home Exercise Plan Access Code: [email or web address], added ankle 4 way    Consulted and Agree with Plan of Care Patient           Patient will benefit from skilled therapeutic intervention in order to improve the following deficits and impairments:  Abnormal gait,Decreased activity tolerance,Decreased balance,Decreased endurance,Decreased coordination,Decreased mobility,Decreased range of motion,Decreased strength,Difficulty walking,Increased muscle spasms,Pain,Improper body mechanics  Visit Diagnosis: Bilateral low back pain with left-sided sciatica, unspecified chronicity  Localized edema  Muscle weakness (generalized)  Other abnormalities of gait and mobility     Problem List Patient Active Problem List   Diagnosis Date Noted  . ED (erectile dysfunction) 08/09/2012  . Hypercholesterolemia   . Chronic rhinitis   . Hyperlipidemia 02/29/2012    [name] 02/19/2021, 10:01 AM  [facility] Physical Therapy [address] [place], [place], [address] Phone: [phone]   Fax:  [phone]  Name: Jonathan Bridges MRN:  [id number] Date of Birth: 1946-06-06

## 2021-02-24 ENCOUNTER — Other Ambulatory Visit: Payer: Self-pay

## 2021-02-24 ENCOUNTER — Encounter: Payer: Self-pay | Admitting: Physical Therapy

## 2021-02-24 ENCOUNTER — Ambulatory Visit: Payer: Medicare PPO | Admitting: Physical Therapy

## 2021-02-24 DIAGNOSIS — R600 Localized edema: Secondary | ICD-10-CM | POA: Diagnosis not present

## 2021-02-24 DIAGNOSIS — M5442 Lumbago with sciatica, left side: Secondary | ICD-10-CM

## 2021-02-24 DIAGNOSIS — M6281 Muscle weakness (generalized): Secondary | ICD-10-CM

## 2021-02-24 DIAGNOSIS — R2689 Other abnormalities of gait and mobility: Secondary | ICD-10-CM | POA: Diagnosis not present

## 2021-02-24 NOTE — Therapy (Signed)
[place], [place], [address] Phone: [phone]   Fax:  [phone]  Physical Therapy Treatment  Patient Details  Name: Jonathan Bridges MRN: [id number] Date of Birth: 1946-08-08 Referring Provider (PT): Judith Part, MD   Encounter Date: 02/24/2021   PT End of Session - 02/24/21 0927    Visit Number 38    Number of Visits 50    Date for PT Re-Evaluation 04/12/21    Authorization Type 12 more visits approaved until 5/8    Authorization - Visit Number 2    Authorization - Number of Visits 12    Progress Note Due on Visit 66    PT Start Time 0843    PT Stop Time 0929    PT Time Calculation (min) 46 min    Activity Tolerance Patient tolerated treatment well    Behavior During Therapy [facility] for tasks assessed/performed           Past Medical History:  Diagnosis Date  . Anxiety   . Basal cell carcinoma   . Chronic rhinitis   . Clotting disorder (HCC)    right leg   . Depression   . Diverticulosis   . Elevated LFTs   . History of hypertension   . Hypercholesterolemia   . Sleep apnea     Past Surgical History:  Procedure Laterality Date  . COLONOSCOPY    . WRIST SURGERY  01/24/2003   Right- ORIF: Dr. Amedeo Plenty    There were no vitals filed for this visit.   Subjective Assessment - 02/24/21 0856    Subjective He relays he worked in his yard yesterday so he is feeling more sore and stiff overall from that.    Pertinent History lumbar surgery 07/09/20    Pain Onset More than a month ago            [facility] Adult PT Treatment/Exercise - 02/24/21 0001      Neuro Re-ed    Neuro Re-ed Details  rocker board 20 reps A-P, lateral,  SLS balance on foam with Rt leg hip abd abd ext kicks X 10 ea, tandem balance on foam 30 [place] X 2 bilat      Lumbar Exercises: Stretches   Gastroc Stretch 30 seconds    Gastroc Stretch Limitations 2 reps gastroc, 2 reps soleus on slantboard      Lumbar Exercises: Aerobic    Recumbent Bike 8 min L6      Lumbar Exercises: Machines for Strengthening   Cybex Knee Extension 45 lbs 3X10    Cybex Knee Flexion 55 lbs 3X10    Leg Press 93 lbs Lt leg 3X10, then SL calf raises 25 lbs 2X10, DL calf raises 50 lbs 2X10. plyometric jumping DL 56lbs 15x      Lumbar Exercises: Standing   Other Standing Lumbar Exercises TRX rows and squats 2 sets of 10    Other Standing Lumbar Exercises heel and toe raises with ball sq X 20                    PT Short Term Goals - 02/17/21 0920      PT SHORT TERM GOAL #1   Title Pt will be I and compliant with HEP.    Time 4    Period Weeks    Status Achieved    Target Date 10/13/20      PT SHORT TERM GOAL #2   Title Pt will reduce overall back pain >  25%    Baseline more numbness than pain now    Time 4    Period Weeks    Status Achieved             PT Long Term Goals - 02/17/21 0920      PT LONG TERM GOAL #1   Title Pt will reduce overall pain >50%    Baseline patient believe this goal has been met with current pain management    Time 12    Period Weeks    Status Achieved      PT LONG TERM GOAL #2   Title Patient will demonstrate independent use of home exercise program progression to facilitate ability to maintain/progress functional gains from skilled physical therapy services.    Baseline progressed today    Time 12    Period Weeks    Status Achieved      PT LONG TERM GOAL #3   Title Pt will improve lumbar ROM to [facility]    Baseline now met    Time 12    Period Weeks    Status Achieved      PT LONG TERM GOAL #4   Title Pt will be able to ambulate community distances without AD and return to normal ADLS    Baseline can amblate community distances now without AD but still not back to all his normal ADLs    Time 12    Period Weeks    Status On-going      PT LONG TERM GOAL #5   Title [name] 4+ throughout    Baseline still with Lt ankle weakness 3+ to 4    Time 12     Period Weeks    Status On-going                 Plan - 02/24/21 0932    Clinical Impression Statement He was able to show good overall tolerance to strength progression today. We will continue to work toward his functional goals as he can tolerate.    Examination-Activity Limitations Bend;Dressing;Lift;Stand;Stairs;Squat;Sleep;Locomotion Level    Examination-Participation Restrictions Cleaning;Driving;Laundry;Yard Work;Shop    Stability/Clinical Decision Making Evolving/Moderate complexity    Rehab Potential Good    PT Frequency 2x / week    PT Duration 12 weeks   6-12 weeks   PT Treatment/Interventions ADLs/Self Care Home Management;Cryotherapy;Barrister's clerk;Therapeutic activities;Therapeutic exercise;Neuromuscular re-education;Manual techniques;Passive range of motion;Taping;Spinal Manipulations;Joint Manipulations;Dry needling;Balance training    PT Next Visit Plan PF eccentrics, ankle 4 way with slow lowering, plyometric jumping on leg press    PT Home Exercise Plan Access Code: [email or web address], added ankle 4 way    Consulted and Agree with Plan of Care Patient           Patient will benefit from skilled therapeutic intervention in order to improve the following deficits and impairments:  Abnormal gait,Decreased activity tolerance,Decreased balance,Decreased endurance,Decreased coordination,Decreased mobility,Decreased range of motion,Decreased strength,Difficulty walking,Increased muscle spasms,Pain,Improper body mechanics  Visit Diagnosis: Bilateral low back pain with left-sided sciatica, unspecified chronicity  Localized edema  Muscle weakness (generalized)  Other abnormalities of gait and mobility     Problem List Patient Active Problem List   Diagnosis Date Noted  . ED (erectile dysfunction) 08/09/2012  . Hypercholesterolemia   . Chronic rhinitis   . Hyperlipidemia 02/29/2012    Debbe Odea,  PT,DPT 02/24/2021, 9:35 AM  [facility] Physical Therapy [address] [place], [place], [address] Phone: [phone]   Fax:  [phone]  Name: Jonathan Bridges MRN: [id number] Date of Birth: 1946/03/18

## 2021-02-26 ENCOUNTER — Other Ambulatory Visit: Payer: Self-pay

## 2021-02-26 ENCOUNTER — Ambulatory Visit: Payer: Medicare PPO | Admitting: Physical Therapy

## 2021-02-26 DIAGNOSIS — M5442 Lumbago with sciatica, left side: Secondary | ICD-10-CM | POA: Diagnosis not present

## 2021-02-26 DIAGNOSIS — R600 Localized edema: Secondary | ICD-10-CM | POA: Diagnosis not present

## 2021-02-26 DIAGNOSIS — R2689 Other abnormalities of gait and mobility: Secondary | ICD-10-CM | POA: Diagnosis not present

## 2021-02-26 DIAGNOSIS — M6281 Muscle weakness (generalized): Secondary | ICD-10-CM

## 2021-02-26 NOTE — Therapy (Signed)
[place], [place], [address] Phone: [phone]   Fax:  [phone]  Physical Therapy Treatment  Patient Details  Name: Jonathan Bridges MRN: [id number] Date of Birth: 1946/04/09 Referring Provider (PT): Judith Part, MD   Encounter Date: 02/26/2021   PT End of Session - 02/26/21 0933    Visit Number 39    Number of Visits 50    Date for PT Re-Evaluation 04/12/21    Authorization Type 12 more visits approaved until 5/8    Authorization - Visit Number 3    Authorization - Number of Visits 12    Progress Note Due on Visit 36    PT Start Time 0844    PT Stop Time 0927    PT Time Calculation (min) 43 min    Activity Tolerance Patient tolerated treatment well    Behavior During Therapy [facility] for tasks assessed/performed           Past Medical History:  Diagnosis Date  . Anxiety   . Basal cell carcinoma   . Chronic rhinitis   . Clotting disorder (HCC)    right leg   . Depression   . Diverticulosis   . Elevated LFTs   . History of hypertension   . Hypercholesterolemia   . Sleep apnea     Past Surgical History:  Procedure Laterality Date  . COLONOSCOPY    . WRIST SURGERY  01/24/2003   Right- ORIF: Dr. Amedeo Plenty    There were no vitals filed for this visit.   Subjective Assessment - 02/26/21 0915    Subjective He relays more pain in the mornings about 2-3/10 then it gets better during the day.    Pertinent History lumbar surgery 07/09/20    Pain Onset More than a month ago            [facility] Adult PT Treatment/Exercise - 02/26/21 0001      Neuro Re-ed    Neuro Re-ed Details  walking with head turns, walking with head nods, retro walking, walking with eyes closed, tandem walking, stepping over obstacles step to pattern      Lumbar Exercises: Stretches   Gastroc Stretch 3 reps;30 seconds    Gastroc Stretch Limitations slantboard      Lumbar Exercises: Aerobic   Recumbent Bike 8 min L6      Lumbar  Exercises: Machines for Strengthening   Cybex Knee Extension 45 lbs 3X10    Cybex Knee Flexion 55 lbs 3X10      Lumbar Exercises: Standing   Lifting Limitations 2 sets of 10 with 15#KB deadlift floor to waist    Other Standing Lumbar Exercises suitcase carry 15# one lap ea side    Other Standing Lumbar Exercises heel and toe raises with ball sq X 20      Lumbar Exercises: Seated   Sit to Stand Limitations 2 sets of 10 with 15#      Ankle Exercises: Supine   T-Band LLE for L5 for DF/PF X 20, L2 for INV/EV X 20    Other Supine Ankle Exercises ankle alphabet 3# on left            PT Short Term Goals - 02/17/21 0920      PT SHORT TERM GOAL #1   Title Pt will be I and compliant with HEP.    Time 4    Period Weeks    Status Achieved    Target Date 10/13/20  PT SHORT TERM GOAL #2   Title Pt will reduce overall back pain >25%    Baseline more numbness than pain now    Time 4    Period Weeks    Status Achieved             PT Long Term Goals - 02/17/21 0920      PT LONG TERM GOAL #1   Title Pt will reduce overall pain >50%    Baseline patient believe this goal has been met with current pain management    Time 12    Period Weeks    Status Achieved      PT LONG TERM GOAL #2   Title Patient will demonstrate independent use of home exercise program progression to facilitate ability to maintain/progress functional gains from skilled physical therapy services.    Baseline progressed today    Time 12    Period Weeks    Status Achieved      PT LONG TERM GOAL #3   Title Pt will improve lumbar ROM to [facility]    Baseline now met    Time 12    Period Weeks    Status Achieved      PT LONG TERM GOAL #4   Title Pt will be able to ambulate community distances without AD and return to normal ADLS    Baseline can amblate community distances now without AD but still not back to all his normal ADLs    Time 12    Period Weeks    Status On-going      PT LONG TERM GOAL #5    Title [name] 4+ throughout    Baseline still with Lt ankle weakness 3+ to 4    Time 12    Period Weeks    Status On-going                 Plan - 02/26/21 0936    Clinical Impression Statement Progressed dyamic balance and he was overall supervision with this but one episode of min to mod A to regain balance trying to step over obstacles. His quad strength continues to progress but he also continues to be limited by poor Lt ankle strength so we worked on this more today also.    Examination-Activity Limitations Bend;Dressing;Lift;Stand;Stairs;Squat;Sleep;Locomotion Level    Examination-Participation Restrictions Cleaning;Driving;Laundry;Yard Work;Shop    Stability/Clinical Decision Making Evolving/Moderate complexity    Rehab Potential Good    PT Frequency 2x / week    PT Duration 12 weeks   6-12 weeks   PT Treatment/Interventions ADLs/Self Care Home Management;Cryotherapy;Barrister's clerk;Therapeutic activities;Therapeutic exercise;Neuromuscular re-education;Manual techniques;Passive range of motion;Taping;Spinal Manipulations;Joint Manipulations;Dry needling;Balance training    PT Next Visit Plan PF eccentrics, ankle 4 way with slow lowering, plyometric jumping on leg press    PT Home Exercise Plan Access Code: [email or web address], added ankle 4 way    Consulted and Agree with Plan of Care Patient           Patient will benefit from skilled therapeutic intervention in order to improve the following deficits and impairments:  Abnormal gait,Decreased activity tolerance,Decreased balance,Decreased endurance,Decreased coordination,Decreased mobility,Decreased range of motion,Decreased strength,Difficulty walking,Increased muscle spasms,Pain,Improper body mechanics  Visit Diagnosis: Bilateral low back pain with left-sided sciatica, unspecified chronicity  Localized edema  Muscle weakness (generalized)  Other  abnormalities of gait and mobility     Problem List Patient Active Problem List   Diagnosis Date Noted  . ED (erectile dysfunction) 08/09/2012  .  Hypercholesterolemia   . Chronic rhinitis   . Hyperlipidemia 02/29/2012    Debbe Odea , PT,DPT 02/26/2021, 9:38 AM  [facility] Physical Therapy [address] [place], [place], [address] Phone: [phone]   Fax:  [phone]  Name: AYVION KAVANAGH MRN: [id number] Date of Birth: Nov 19, 1946

## 2021-03-10 ENCOUNTER — Ambulatory Visit: Payer: Medicare PPO | Admitting: Physical Therapy

## 2021-03-10 ENCOUNTER — Other Ambulatory Visit: Payer: Self-pay

## 2021-03-10 DIAGNOSIS — M6281 Muscle weakness (generalized): Secondary | ICD-10-CM | POA: Diagnosis not present

## 2021-03-10 DIAGNOSIS — R2689 Other abnormalities of gait and mobility: Secondary | ICD-10-CM

## 2021-03-10 DIAGNOSIS — R600 Localized edema: Secondary | ICD-10-CM

## 2021-03-10 DIAGNOSIS — M5442 Lumbago with sciatica, left side: Secondary | ICD-10-CM | POA: Diagnosis not present

## 2021-03-10 NOTE — Therapy (Signed)
[facility] [place], [place], [address] Phone: [phone]   Fax:  [phone]  Physical Therapy Treatment  Patient Details  Name: Jonathan Bridges MRN: [id number] Date of Birth: Apr 18, 1946 Referring Provider (PT): Judith Part, MD   Encounter Date: 03/10/2021   PT End of Session - 03/10/21 0853    Visit Number 40    Number of Visits 50    Date for PT Re-Evaluation 04/12/21    Authorization Type 12 more visits approaved until 5/8    Authorization - Visit Number 4    Authorization - Number of Visits 12    Progress Note Due on Visit 67    PT Start Time 0801    PT Stop Time 0846    PT Time Calculation (min) 45 min    Activity Tolerance Patient tolerated treatment well    Behavior During Therapy [facility] for tasks assessed/performed           Past Medical History:  Diagnosis Date  . Anxiety   . Basal cell carcinoma   . Chronic rhinitis   . Clotting disorder (HCC)    right leg   . Depression   . Diverticulosis   . Elevated LFTs   . History of hypertension   . Hypercholesterolemia   . Sleep apnea     Past Surgical History:  Procedure Laterality Date  . COLONOSCOPY    . WRIST SURGERY  01/24/2003   Right- ORIF: Dr. Amedeo Plenty    There were no vitals filed for this visit.   Subjective Assessment - 03/10/21 0851    Subjective He relays now new complaints of pain other than the usual, he still feels like his balance still isnt quite there yet    Pertinent History lumbar surgery 07/09/20    Pain Onset More than a month ago              [facility] PT Assessment - 03/10/21 0001      Assessment   Medical Diagnosis lumbar radiculopathy, post op disc herniation operation    Referring Provider (PT) Judith Part, MD    Onset Date/Surgical Date 07/09/20      AROM   Lumbar Flexion WNL    Lumbar Extension 80%    Lumbar - Right Rotation WNL    Lumbar - Left Rotation WNL      Strength   Left Knee Flexion 5/5    Left  Knee Extension 5/5    Left Ankle Dorsiflexion 4/5    Left Ankle Plantar Flexion 4+/5    Left Ankle Inversion 4/5    Left Ankle Eversion 3+/5             [facility] Adult PT Treatment/Exercise - 03/10/21 0001      Neuro Re-ed    Neuro Re-ed Details  stepping over obstacles, SLS      Lumbar Exercises: Stretches   Gastroc Stretch 3 reps;30 seconds    Gastroc Stretch Limitations slantboard      Lumbar Exercises: Aerobic   Recumbent Bike 8 min L6      Lumbar Exercises: Machines for Strengthening   Cybex Knee Extension 55 lbs 3X10    Cybex Knee Flexion 55 lbs 3X10    Leg Press 100 lbs Lt leg 3X10, DL calf raises 50 lbs 2X15. plyometric jumping DL 62lbs 15x      Lumbar Exercises: Seated   Other Seated Lumbar Exercises ankle PF with 2 L5 bands 3X10, then ankle EV/INV with red X  20 ea                    PT Short Term Goals - 02/17/21 0920      PT SHORT TERM GOAL #1   Title Pt will be I and compliant with HEP.    Time 4    Period Weeks    Status Achieved    Target Date 10/13/20      PT SHORT TERM GOAL #2   Title Pt will reduce overall back pain >25%    Baseline more numbness than pain now    Time 4    Period Weeks    Status Achieved             PT Long Term Goals - 02/17/21 0920      PT LONG TERM GOAL #1   Title Pt will reduce overall pain >50%    Baseline patient believe this goal has been met with current pain management    Time 12    Period Weeks    Status Achieved      PT LONG TERM GOAL #2   Title Patient will demonstrate independent use of home exercise program progression to facilitate ability to maintain/progress functional gains from skilled physical therapy services.    Baseline progressed today    Time 12    Period Weeks    Status Achieved      PT LONG TERM GOAL #3   Title Pt will improve lumbar ROM to [facility]    Baseline now met    Time 12    Period Weeks    Status Achieved      PT LONG TERM GOAL #4   Title Pt will be able to ambulate  community distances without AD and return to normal ADLS    Baseline can amblate community distances now without AD but still not back to all his normal ADLs    Time 12    Period Weeks    Status On-going      PT LONG TERM GOAL #5   Title [name] 4+ throughout    Baseline still with Lt ankle weakness 3+ to 4    Time 12    Period Weeks    Status On-going                 Plan - 03/10/21 0854    Clinical Impression Statement Continued to work to improve overall balance, Lt leg strength and coordination. He is able to increase his weight again with resistance training. He will continue to benefit from PT to maximize funciton after lumbar surgery.    Examination-Activity Limitations Bend;Dressing;Lift;Stand;Stairs;Squat;Sleep;Locomotion Level    Examination-Participation Restrictions Cleaning;Driving;Laundry;Yard Work;Shop    Stability/Clinical Decision Making Evolving/Moderate complexity    Rehab Potential Good    PT Frequency 2x / week    PT Duration 12 weeks   6-12 weeks   PT Treatment/Interventions ADLs/Self Care Home Management;Cryotherapy;Barrister's clerk;Therapeutic activities;Therapeutic exercise;Neuromuscular re-education;Manual techniques;Passive range of motion;Taping;Spinal Manipulations;Joint Manipulations;Dry needling;Balance training    PT Next Visit Plan PF eccentrics, ankle 4 way with slow lowering, plyometric jumping on leg press    PT Home Exercise Plan Access Code: [email or web address], added ankle 4 way    Consulted and Agree with Plan of Care Patient           Patient will benefit from skilled therapeutic intervention in order to improve the following deficits and impairments:  Abnormal gait,Decreased activity tolerance,Decreased balance,Decreased endurance,Decreased  coordination,Decreased mobility,Decreased range of motion,Decreased strength,Difficulty walking,Increased muscle  spasms,Pain,Improper body mechanics  Visit Diagnosis: Bilateral low back pain with left-sided sciatica, unspecified chronicity  Localized edema  Muscle weakness (generalized)  Other abnormalities of gait and mobility     Problem List Patient Active Problem List   Diagnosis Date Noted  . ED (erectile dysfunction) 08/09/2012  . Hypercholesterolemia   . Chronic rhinitis   . Hyperlipidemia 02/29/2012    [name] 03/10/2021, 8:57 AM  [facility] Physical Therapy [address] [place], [place], [address] Phone: [phone]   Fax:  [phone]  Name: Jonathan Bridges MRN: [id number] Date of Birth: 1946-05-11

## 2021-03-12 ENCOUNTER — Other Ambulatory Visit: Payer: Self-pay

## 2021-03-12 ENCOUNTER — Ambulatory Visit: Payer: Medicare PPO | Admitting: Physical Therapy

## 2021-03-12 DIAGNOSIS — R600 Localized edema: Secondary | ICD-10-CM

## 2021-03-12 DIAGNOSIS — M5442 Lumbago with sciatica, left side: Secondary | ICD-10-CM | POA: Diagnosis not present

## 2021-03-12 DIAGNOSIS — R2689 Other abnormalities of gait and mobility: Secondary | ICD-10-CM | POA: Diagnosis not present

## 2021-03-12 DIAGNOSIS — M6281 Muscle weakness (generalized): Secondary | ICD-10-CM | POA: Diagnosis not present

## 2021-03-12 NOTE — Therapy (Signed)
[place], [place], [address] Phone: [phone]   Fax:  [phone]  Physical Therapy Treatment  Patient Details  Name: Jonathan Bridges MRN: [id number] Date of Birth: 06-21-1946 Referring Provider (PT): Judith Part, MD   Encounter Date: 03/12/2021   PT End of Session - 03/12/21 1023    Visit Number 41    Number of Visits 50    Date for PT Re-Evaluation 04/12/21    Authorization Type 12 more visits approaved until 5/8    Authorization - Visit Number 5    Authorization - Number of Visits 12    Progress Note Due on Visit 51    PT Start Time 0845    PT Stop Time 0932    PT Time Calculation (min) 47 min    Activity Tolerance Patient tolerated treatment well    Behavior During Therapy [facility] for tasks assessed/performed           Past Medical History:  Diagnosis Date  . Anxiety   . Basal cell carcinoma   . Chronic rhinitis   . Clotting disorder (HCC)    right leg   . Depression   . Diverticulosis   . Elevated LFTs   . History of hypertension   . Hypercholesterolemia   . Sleep apnea     Past Surgical History:  Procedure Laterality Date  . COLONOSCOPY    . WRIST SURGERY  01/24/2003   Right- ORIF: Dr. Amedeo Plenty    There were no vitals filed for this visit.   Subjective Assessment - 03/12/21 1020    Subjective He relays still with stiffness and numbness in his leg but the numbness in the sole of the foot may be improving slighly. He will follow up with MD next week    Pertinent History lumbar surgery 07/09/20    Pain Onset More than a month ago            [facility] Adult PT Treatment/Exercise - 03/12/21 0001      Neuro Re-ed    Neuro Re-ed Details  SLS on foam with one fingertip support 15 sec  X8 reps      Lumbar Exercises: Stretches   Gastroc Stretch 3 reps;30 seconds    Gastroc Stretch Limitations slantboard      Lumbar Exercises: Aerobic   Recumbent Bike 8 min L6      Lumbar Exercises: Machines  for Strengthening   Cybex Knee Extension 55 lbs 3X10    Cybex Knee Flexion 55 lbs 3X10    Leg Press 100 lbs Lt leg 3X10, DL calf raises 50 lbs 2X15. plyometric jumping DL 62lbs 3X10      Lumbar Exercises: Standing   Heel Raises Limitations heel and toe raises 3X10             PT Short Term Goals - 02/17/21 0920      PT SHORT TERM GOAL #1   Title Pt will be I and compliant with HEP.    Time 4    Period Weeks    Status Achieved    Target Date 10/13/20      PT SHORT TERM GOAL #2   Title Pt will reduce overall back pain >25%    Baseline more numbness than pain now    Time 4    Period Weeks    Status Achieved             PT Long Term Goals - 02/17/21 9794  PT LONG TERM GOAL #1   Title Pt will reduce overall pain >50%    Baseline patient believe this goal has been met with current pain management    Time 12    Period Weeks    Status Achieved      PT LONG TERM GOAL #2   Title Patient will demonstrate independent use of home exercise program progression to facilitate ability to maintain/progress functional gains from skilled physical therapy services.    Baseline progressed today    Time 12    Period Weeks    Status Achieved      PT LONG TERM GOAL #3   Title Pt will improve lumbar ROM to [facility]    Baseline now met    Time 12    Period Weeks    Status Achieved      PT LONG TERM GOAL #4   Title Pt will be able to ambulate community distances without AD and return to normal ADLS    Baseline can amblate community distances now without AD but still not back to all his normal ADLs    Time 12    Period Weeks    Status On-going      PT LONG TERM GOAL #5   Title [name] 4+ throughout    Baseline still with Lt ankle weakness 3+ to 4    Time 12    Period Weeks    Status On-going                 Plan - 03/12/21 1024    Clinical Impression Statement he continues to be limited with Lt leg weaknss and overall balance with SLS  however dynamic balance and stepping strategies are overall good so there is not concern for him falling. He will continue to benefit from skilled PT to address his remaining Lt leg weakness and improve overall endurance and funtion.    Examination-Activity Limitations Bend;Dressing;Lift;Stand;Stairs;Squat;Sleep;Locomotion Level    Examination-Participation Restrictions Cleaning;Driving;Laundry;Yard Work;Shop    Stability/Clinical Decision Making Evolving/Moderate complexity    Rehab Potential Good    PT Frequency 2x / week    PT Duration 12 weeks   6-12 weeks   PT Treatment/Interventions ADLs/Self Care Home Management;Cryotherapy;Barrister's clerk;Therapeutic activities;Therapeutic exercise;Neuromuscular re-education;Manual techniques;Passive range of motion;Taping;Spinal Manipulations;Joint Manipulations;Dry needling;Balance training    PT Next Visit Plan PF eccentrics, ankle 4 way with slow lowering, plyometric jumping on leg press    PT Home Exercise Plan Access Code: [email or web address], added ankle 4 way    Consulted and Agree with Plan of Care Patient           Patient will benefit from skilled therapeutic intervention in order to improve the following deficits and impairments:  Abnormal gait,Decreased activity tolerance,Decreased balance,Decreased endurance,Decreased coordination,Decreased mobility,Decreased range of motion,Decreased strength,Difficulty walking,Increased muscle spasms,Pain,Improper body mechanics  Visit Diagnosis: Bilateral low back pain with left-sided sciatica, unspecified chronicity  Localized edema  Muscle weakness (generalized)  Other abnormalities of gait and mobility     Problem List Patient Active Problem List   Diagnosis Date Noted  . ED (erectile dysfunction) 08/09/2012  . Hypercholesterolemia   . Chronic rhinitis   . Hyperlipidemia 02/29/2012    [name] 03/12/2021, 10:26 AM  [facility] Physical Therapy [address] [place], [place], [address] Phone: [phone]   Fax:  [phone]  Name: Jonathan Bridges MRN: [id number] Date of Birth: 1946-10-21

## 2021-03-17 ENCOUNTER — Encounter: Payer: Self-pay | Admitting: Physical Therapy

## 2021-03-17 ENCOUNTER — Other Ambulatory Visit: Payer: Self-pay

## 2021-03-17 ENCOUNTER — Ambulatory Visit: Payer: Medicare PPO | Admitting: Physical Therapy

## 2021-03-17 DIAGNOSIS — M6281 Muscle weakness (generalized): Secondary | ICD-10-CM | POA: Diagnosis not present

## 2021-03-17 DIAGNOSIS — M5442 Lumbago with sciatica, left side: Secondary | ICD-10-CM

## 2021-03-17 DIAGNOSIS — R2689 Other abnormalities of gait and mobility: Secondary | ICD-10-CM | POA: Diagnosis not present

## 2021-03-17 DIAGNOSIS — R600 Localized edema: Secondary | ICD-10-CM

## 2021-03-17 NOTE — Therapy (Signed)
[facility] Physical Therapy [address] [place], [place], [address] Phone: [phone]   Fax:  [phone]  Physical Therapy Treatment/Progress note Progress Note reporting period date 02/03/21 to 03/17/21  See below for objective and subjective measurements relating to patients progress with PT.  Patient Details  Name: Jonathan Bridges MRN: [id number] Date of Birth: 1946-03-03 Referring Provider (PT): Judith Part, MD   Encounter Date: 03/17/2021   PT End of Session - 03/17/21 0816    Visit Number 42    Number of Visits 50    Date for PT Re-Evaluation 04/12/21    Authorization Type 12 more visits approaved until 5/8    Authorization - Visit Number 6    Authorization - Number of Visits 12    Progress Note Due on Visit 1    PT Start Time 0800    PT Stop Time 0845    PT Time Calculation (min) 45 min    Activity Tolerance Patient tolerated treatment well    Behavior During Therapy [facility] for tasks assessed/performed           Past Medical History:  Diagnosis Date  . Anxiety   . Basal cell carcinoma   . Chronic rhinitis   . Clotting disorder (HCC)    right leg   . Depression   . Diverticulosis   . Elevated LFTs   . History of hypertension   . Hypercholesterolemia   . Sleep apnea     Past Surgical History:  Procedure Laterality Date  . COLONOSCOPY    . WRIST SURGERY  01/24/2003   Right- ORIF: Dr. Amedeo Plenty    There were no vitals filed for this visit.   Subjective Assessment - 03/17/21 0814    Subjective He relays not much pain but stiff in the mornings and still with numbness in his foot. He will see MD thursday for follow up.    Pertinent History lumbar surgery 07/09/20    Pain Onset More than a month ago              [facility] PT Assessment - 03/17/21 0001      Assessment   Medical Diagnosis lumbar radiculopathy, post op disc herniation operation    Referring Provider (PT) Judith Part, MD    Onset Date/Surgical Date 07/09/20       Single Leg Stance   Comments Lt leg 6 sec at best Rt leg 30 sec avg      AROM   Lumbar Flexion WNL    Lumbar Extension 90%    Lumbar - Right Rotation WNL    Lumbar - Left Rotation WNL      Strength   Overall Strength Comments tested in sitting    Left Hip Flexion 5/5    Left Hip ABduction 5/5    Left Knee Flexion 5/5    Left Knee Extension 5/5    Left Ankle Dorsiflexion 4/5    Left Ankle Plantar Flexion 4/5    Left Ankle Inversion 4/5    Left Ankle Eversion 4/5                         [facility] Adult PT Treatment/Exercise - 03/17/21 0001      Lumbar Exercises: Stretches   Gastroc Stretch 30 seconds;4 reps   2 gastroc, 2 soleus   Gastroc Stretch Limitations slantboard      Lumbar Exercises: Aerobic   Recumbent Bike 8 min L6  Lumbar Exercises: Standing   Heel Raises Limitations heel and toe raises X20      Lumbar Exercises: Seated   Other Seated Lumbar Exercises sit to stands 2X10 no UE support, then one more set of 10 with Rt foot further in front for more weight shift onto his Lt      Lumbar Exercises: Supine   Other Supine Lumbar Exercises Lt ankle DF green X20, INV/EV red X 20 and PF black 3X10    Other Supine Lumbar Exercises Lt ankle alphabet X1                    PT Short Term Goals - 02/17/21 0920      PT SHORT TERM GOAL #1   Title Pt will be I and compliant with HEP.    Time 4    Period Weeks    Status Achieved    Target Date 10/13/20      PT SHORT TERM GOAL #2   Title Pt will reduce overall back pain >25%    Baseline more numbness than pain now    Time 4    Period Weeks    Status Achieved             PT Long Term Goals - 03/17/21 0919      PT LONG TERM GOAL #1   Title Pt will reduce overall pain >50%    Baseline patient believe this goal has been met with current pain management    Time 12    Period Weeks    Status Achieved      PT LONG TERM GOAL #2   Title Patient will demonstrate independent use of home  exercise program progression to facilitate ability to maintain/progress functional gains from skilled physical therapy services.    Baseline progressed today    Time 12    Period Weeks    Status Achieved      PT LONG TERM GOAL #3   Title Pt will improve lumbar ROM to [facility]    Baseline now met    Time 12    Period Weeks    Status Achieved      PT LONG TERM GOAL #4   Title Pt will be able to ambulate community distances without AD and return to normal ADLS    Baseline can amblate community distances now without AD but still not back to all his normal ADLs    Time 12    Period Weeks    Status Achieved      PT LONG TERM GOAL #5   Title [name] 4+ throughout    Baseline still with Lt ankle weakness 3+ to 4    Time 12    Period Weeks    Status On-going      Additional Long Term Goals   Additional Long Term Goals Yes      PT LONG TERM GOAL #6   Title He will be able to hold SLS on Lt leg for 10 sec to show improved balance    Time 4    Period Weeks    Status New    Target Date 04/19/21                 Plan - 03/17/21 0917    Clinical Impression Statement He has overall done fairly well with PT post op lumbar surgery but does still lack Lt ankle strength, coordination, and overall balance with SLS on this  Lt. Other than that he has met his other PT goals. PT recommending to continue PT for up to 8 more visits with focus on these remaining functional deficits. He will follow up with MD on thursday so we will await any furhter recommendations from them.    Examination-Activity Limitations Bend;Dressing;Lift;Stand;Stairs;Squat;Sleep;Locomotion Level    Examination-Participation Restrictions Cleaning;Driving;Laundry;Yard Work;Shop    Stability/Clinical Decision Making Evolving/Moderate complexity    Rehab Potential Good    PT Frequency 2x / week    PT Duration 12 weeks   6-12 weeks   PT Treatment/Interventions ADLs/Self Care Home  Management;Cryotherapy;Barrister's clerk;Therapeutic activities;Therapeutic exercise;Neuromuscular re-education;Manual techniques;Passive range of motion;Taping;Spinal Manipulations;Joint Manipulations;Dry needling;Balance training    PT Next Visit Plan focus on ankle and SLS    PT Home Exercise Plan Access Code: [email or web address], added ankle 4 way    Consulted and Agree with Plan of Care Patient           Patient will benefit from skilled therapeutic intervention in order to improve the following deficits and impairments:  Abnormal gait,Decreased activity tolerance,Decreased balance,Decreased endurance,Decreased coordination,Decreased mobility,Decreased range of motion,Decreased strength,Difficulty walking,Increased muscle spasms,Pain,Improper body mechanics  Visit Diagnosis: Bilateral low back pain with left-sided sciatica, unspecified chronicity  Localized edema  Muscle weakness (generalized)  Other abnormalities of gait and mobility     Problem List Patient Active Problem List   Diagnosis Date Noted  . ED (erectile dysfunction) 08/09/2012  . Hypercholesterolemia   . Chronic rhinitis   . Hyperlipidemia 02/29/2012    [name] 03/17/2021, 12:31 PM  [facility] Physical Therapy [address] [place], [place], [address] Phone: [phone]   Fax:  [phone]  Name: Jonathan Bridges MRN: [id number] Date of Birth: [birth year]

## 2021-03-19 ENCOUNTER — Ambulatory Visit: Payer: Medicare PPO | Admitting: Physical Therapy

## 2021-03-19 ENCOUNTER — Other Ambulatory Visit: Payer: Self-pay

## 2021-03-19 DIAGNOSIS — M6281 Muscle weakness (generalized): Secondary | ICD-10-CM | POA: Diagnosis not present

## 2021-03-19 DIAGNOSIS — M5442 Lumbago with sciatica, left side: Secondary | ICD-10-CM

## 2021-03-19 DIAGNOSIS — R2689 Other abnormalities of gait and mobility: Secondary | ICD-10-CM

## 2021-03-19 DIAGNOSIS — R600 Localized edema: Secondary | ICD-10-CM

## 2021-03-19 NOTE — Therapy (Signed)
[place], [place], [address] Phone: [phone]   Fax:  [phone]  Physical Therapy Treatment  Patient Details  Name: Jonathan Bridges MRN: [id number] Date of Birth: February 07, 1946 Referring Provider (PT): Judith Part, MD   Encounter Date: 03/19/2021   PT End of Session - 03/19/21 0854    Visit Number 43    Number of Visits 50    Date for PT Re-Evaluation 04/12/21    Authorization Type 12 more visits approaved until 5/8    Authorization - Visit Number 7    Authorization - Number of Visits 12    Progress Note Due on Visit 86    PT Start Time 0800    PT Stop Time 2025    PT Time Calculation (min) 55 min    Activity Tolerance Patient tolerated treatment well    Behavior During Therapy [facility] for tasks assessed/performed           Past Medical History:  Diagnosis Date  . Anxiety   . Basal cell carcinoma   . Chronic rhinitis   . Clotting disorder (HCC)    right leg   . Depression   . Diverticulosis   . Elevated LFTs   . History of hypertension   . Hypercholesterolemia   . Sleep apnea     Past Surgical History:  Procedure Laterality Date  . COLONOSCOPY    . WRIST SURGERY  01/24/2003   Right- ORIF: Dr. Amedeo Plenty    There were no vitals filed for this visit.   Subjective Assessment - 03/19/21 0815    Subjective He relays doing overall pretty well today in terms of back/leg pain.  He will see MD todayfor follow up.    Pertinent History lumbar surgery 07/09/20    Pain Onset More than a month ago             [facility] Adult PT Treatment/Exercise - 03/19/21 0001      Neuro Re-ed    Neuro Re-ed Details  in bars: SLS, SLS on foam with cone taps at 8 inch box X10 bilat, kicking soccer ball mix of bilat leg kicks bosu balance 60 sec then bosu squats with bilat thumb support 2X10, tandem walk on foam 4 trips, lateral walk on foam 4 trips      Lumbar Exercises: Stretches   Gastroc Stretch 30 seconds;4 reps     Gastroc Stretch Limitations slantboard      Lumbar Exercises: Aerobic   Recumbent Bike 8 min L4      Lumbar Exercises: Standing   Heel Raises Limitations heel and toe raises 2X15    Other Standing Lumbar Exercises TKE green band(but can progress to blue next time) 3 positions bilat X15 ea      Lumbar Exercises: Supine   Other Supine Lumbar Exercises Lt ankle DF green X20, INV/EV red X 20 and PF black 3X10    Other Supine Lumbar Exercises Lt ankle alphabet X1             PT Short Term Goals - 02/17/21 0920      PT SHORT TERM GOAL #1   Title Pt will be I and compliant with HEP.    Time 4    Period Weeks    Status Achieved    Target Date 10/13/20      PT SHORT TERM GOAL #2   Title Pt will reduce overall back pain >25%    Baseline more numbness than pain now  Time 4    Period Weeks    Status Achieved             PT Long Term Goals - 03/17/21 0919      PT LONG TERM GOAL #1   Title Pt will reduce overall pain >50%    Baseline patient believe this goal has been met with current pain management    Time 12    Period Weeks    Status Achieved      PT LONG TERM GOAL #2   Title Patient will demonstrate independent use of home exercise program progression to facilitate ability to maintain/progress functional gains from skilled physical therapy services.    Baseline progressed today    Time 12    Period Weeks    Status Achieved      PT LONG TERM GOAL #3   Title Pt will improve lumbar ROM to [facility]    Baseline now met    Time 12    Period Weeks    Status Achieved      PT LONG TERM GOAL #4   Title Pt will be able to ambulate community distances without AD and return to normal ADLS    Baseline can amblate community distances now without AD but still not back to all his normal ADLs    Time 12    Period Weeks    Status Achieved      PT LONG TERM GOAL #5   Title [name] 4+ throughout    Baseline still with Lt ankle weakness 3+ to 4    Time  12    Period Weeks    Status On-going      Additional Long Term Goals   Additional Long Term Goals Yes      PT LONG TERM GOAL #6   Title He will be able to hold SLS on Lt leg for 10 sec to show improved balance    Time 4    Period Weeks    Status New    Target Date 04/19/21                 Plan - 03/19/21 0855    Clinical Impression Statement Continued with SLS stability training, balance, and quad stregth today. Added TKE today for home to help with weight acceptance at initial contact and to try to reduce genu recurvatum in left knee.    Examination-Activity Limitations Bend;Dressing;Lift;Stand;Stairs;Squat;Sleep;Locomotion Level    Examination-Participation Restrictions Cleaning;Driving;Laundry;Yard Work;Shop    Stability/Clinical Decision Making Evolving/Moderate complexity    Rehab Potential Good    PT Frequency 2x / week    PT Duration 12 weeks   6-12 weeks   PT Treatment/Interventions ADLs/Self Care Home Management;Cryotherapy;Barrister's clerk;Therapeutic activities;Therapeutic exercise;Neuromuscular re-education;Manual techniques;Passive range of motion;Taping;Spinal Manipulations;Joint Manipulations;Dry needling;Balance training    PT Next Visit Plan what did MD say? focus on ankle and SLS and left knee TKE    PT Home Exercise Plan Access Code: [email or web address], added ankle 4 way, ankle alphabet and TKE 3 way    Consulted and Agree with Plan of Care Patient           Patient will benefit from skilled therapeutic intervention in order to improve the following deficits and impairments:  Abnormal gait,Decreased activity tolerance,Decreased balance,Decreased endurance,Decreased coordination,Decreased mobility,Decreased range of motion,Decreased strength,Difficulty walking,Increased muscle spasms,Pain,Improper body mechanics  Visit Diagnosis: Bilateral low back pain with left-sided sciatica, unspecified chronicity  Localized  edema  Muscle weakness (generalized)  Other abnormalities of gait and mobility     Problem List Patient Active Problem List   Diagnosis Date Noted  . ED (erectile dysfunction) 08/09/2012  . Hypercholesterolemia   . Chronic rhinitis   . Hyperlipidemia 02/29/2012    [name] 03/19/2021, 9:02 AM  [facility] Physical Therapy [address] [place], [place], [address] Phone: [phone]   Fax:  [phone]  Name: SERGEY ISHLER MRN: [id number] Date of Birth: March 07, 1946

## 2021-03-24 ENCOUNTER — Ambulatory Visit: Payer: Medicare PPO | Admitting: Physical Therapy

## 2021-03-24 ENCOUNTER — Other Ambulatory Visit: Payer: Self-pay

## 2021-03-24 DIAGNOSIS — M6281 Muscle weakness (generalized): Secondary | ICD-10-CM | POA: Diagnosis not present

## 2021-03-24 DIAGNOSIS — R600 Localized edema: Secondary | ICD-10-CM

## 2021-03-24 DIAGNOSIS — M5442 Lumbago with sciatica, left side: Secondary | ICD-10-CM | POA: Diagnosis not present

## 2021-03-24 DIAGNOSIS — R2689 Other abnormalities of gait and mobility: Secondary | ICD-10-CM

## 2021-03-24 NOTE — Therapy (Signed)
[place], [place], [address] Phone: [phone]   Fax:  [phone]  Physical Therapy Treatment  Patient Details  Name: Jonathan Bridges MRN: [id number] Date of Birth: 08-Feb-1946 Referring Provider (PT): Judith Part, MD   Encounter Date: 03/24/2021   PT End of Session - 03/24/21 0919    Visit Number 60    Number of Visits 50    Date for PT Re-Evaluation 04/12/21    Authorization Type 12 more visits approaved until 5/8    Authorization - Visit Number 8    Authorization - Number of Visits 12    Progress Note Due on Visit 31    PT Start Time 0801    PT Stop Time 0845    PT Time Calculation (min) 44 min    Activity Tolerance Patient tolerated treatment well    Behavior During Therapy [facility] for tasks assessed/performed           Past Medical History:  Diagnosis Date  . Anxiety   . Basal cell carcinoma   . Chronic rhinitis   . Clotting disorder (HCC)    right leg   . Depression   . Diverticulosis   . Elevated LFTs   . History of hypertension   . Hypercholesterolemia   . Sleep apnea     Past Surgical History:  Procedure Laterality Date  . COLONOSCOPY    . WRIST SURGERY  01/24/2003   Right- ORIF: Dr. Amedeo Plenty    There were no vitals filed for this visit.   Subjective Assessment - 03/24/21 0857    Subjective He says MD says he is within the bell curve for his recovery S/P back surgery, but does want him to have NCV test due to continued leg weakness    Pertinent History lumbar surgery 07/09/20    Pain Onset More than a month ago                             [facility] Adult PT Treatment/Exercise - 03/24/21 0001      Neuro Re-ed    Neuro Re-ed Details  tandem walk fwd/back on foam, bosu step up and over with UE support up with Lt and down with Rt X 20, standing on foam bilat hip abd/ext X15 no UE support      Lumbar Exercises: Aerobic   Recumbent Bike 8 min L4      Ankle Exercises:  Sidelying   Ankle Eversion Left   2X15 with 2#     Ankle Exercises: Supine   T-Band LLE for L5 for DF/PF X 20, L2 for INV/EV X 20                    PT Short Term Goals - 02/17/21 0920      PT SHORT TERM GOAL #1   Title Pt will be I and compliant with HEP.    Time 4    Period Weeks    Status Achieved    Target Date 10/13/20      PT SHORT TERM GOAL #2   Title Pt will reduce overall back pain >25%    Baseline more numbness than pain now    Time 4    Period Weeks    Status Achieved             PT Long Term Goals - 03/17/21 0919      PT LONG TERM GOAL #  1   Title Pt will reduce overall pain >50%    Baseline patient believe this goal has been met with current pain management    Time 12    Period Weeks    Status Achieved      PT LONG TERM GOAL #2   Title Patient will demonstrate independent use of home exercise program progression to facilitate ability to maintain/progress functional gains from skilled physical therapy services.    Baseline progressed today    Time 12    Period Weeks    Status Achieved      PT LONG TERM GOAL #3   Title Pt will improve lumbar ROM to [facility]    Baseline now met    Time 12    Period Weeks    Status Achieved      PT LONG TERM GOAL #4   Title Pt will be able to ambulate community distances without AD and return to normal ADLS    Baseline can amblate community distances now without AD but still not back to all his normal ADLs    Time 12    Period Weeks    Status Achieved      PT LONG TERM GOAL #5   Title [name] 4+ throughout    Baseline still with Lt ankle weakness 3+ to 4    Time 12    Period Weeks    Status On-going      Additional Long Term Goals   Additional Long Term Goals Yes      PT LONG TERM GOAL #6   Title He will be able to hold SLS on Lt leg for 10 sec to show improved balance    Time 4    Period Weeks    Status New    Target Date 04/19/21                 Plan -  03/24/21 1275    Clinical Impression Statement He met with MD who is ordering NVC test that will be next monday. Continued to focus on his ankle strenght and stability as this is his biggest deficit. He did however show some improvments in contraction of his peroneals today with improved EV ROM against resistance.    Examination-Activity Limitations Bend;Dressing;Lift;Stand;Stairs;Squat;Sleep;Locomotion Level    Examination-Participation Restrictions Cleaning;Driving;Laundry;Yard Work;Shop    Stability/Clinical Decision Making Evolving/Moderate complexity    Rehab Potential Good    PT Frequency 2x / week    PT Duration 12 weeks   6-12 weeks   PT Treatment/Interventions ADLs/Self Care Home Management;Cryotherapy;Barrister's clerk;Therapeutic activities;Therapeutic exercise;Neuromuscular re-education;Manual techniques;Passive range of motion;Taping;Spinal Manipulations;Joint Manipulations;Dry needling;Balance training    PT Next Visit Plan focus on ankle and SLS and left knee TKE    PT Home Exercise Plan Access Code: [email or web address], added ankle 4 way, ankle alphabet and TKE 3 way    Consulted and Agree with Plan of Care Patient           Patient will benefit from skilled therapeutic intervention in order to improve the following deficits and impairments:  Abnormal gait,Decreased activity tolerance,Decreased balance,Decreased endurance,Decreased coordination,Decreased mobility,Decreased range of motion,Decreased strength,Difficulty walking,Increased muscle spasms,Pain,Improper body mechanics  Visit Diagnosis: Bilateral low back pain with left-sided sciatica, unspecified chronicity  Localized edema  Muscle weakness (generalized)  Other abnormalities of gait and mobility     Problem List Patient Active Problem List   Diagnosis Date Noted  . ED (erectile dysfunction) 08/09/2012  . Hypercholesterolemia   .  Chronic rhinitis   . Hyperlipidemia  02/29/2012    [name] 03/24/2021, 9:23 AM  [facility] Physical Therapy [address] [place], [place], [address] Phone: [phone]   Fax:  [phone]  Name: Jonathan Bridges MRN: [id number] Date of Birth: 30-Mar-1946

## 2021-03-26 ENCOUNTER — Other Ambulatory Visit: Payer: Self-pay

## 2021-03-26 ENCOUNTER — Ambulatory Visit: Payer: Medicare PPO | Admitting: Physical Therapy

## 2021-03-26 DIAGNOSIS — M6281 Muscle weakness (generalized): Secondary | ICD-10-CM

## 2021-03-26 DIAGNOSIS — R2689 Other abnormalities of gait and mobility: Secondary | ICD-10-CM

## 2021-03-26 DIAGNOSIS — R600 Localized edema: Secondary | ICD-10-CM | POA: Diagnosis not present

## 2021-03-26 DIAGNOSIS — M25511 Pain in right shoulder: Secondary | ICD-10-CM

## 2021-03-26 DIAGNOSIS — M5442 Lumbago with sciatica, left side: Secondary | ICD-10-CM

## 2021-03-26 DIAGNOSIS — M25611 Stiffness of right shoulder, not elsewhere classified: Secondary | ICD-10-CM

## 2021-03-26 NOTE — Therapy (Signed)
[place], [place], [address] Phone: [phone]   Fax:  [phone]  Physical Therapy Treatment  Patient Details  Name: Jonathan Bridges MRN: [id number] Date of Birth: Sep 23, 1946 Referring Provider (PT): Judith Part, MD   Encounter Date: 03/26/2021   PT End of Session - 03/26/21 0919    Visit Number 45    Number of Visits 50    Date for PT Re-Evaluation 04/12/21    Authorization Type 12 more visits approaved until 5/8    Authorization - Visit Number 9    Authorization - Number of Visits 12    Progress Note Due on Visit 65    PT Start Time 0800    PT Stop Time 0845    PT Time Calculation (min) 45 min    Activity Tolerance Patient tolerated treatment well    Behavior During Therapy [facility] for tasks assessed/performed           Past Medical History:  Diagnosis Date  . Anxiety   . Basal cell carcinoma   . Chronic rhinitis   . Clotting disorder (HCC)    right leg   . Depression   . Diverticulosis   . Elevated LFTs   . History of hypertension   . Hypercholesterolemia   . Sleep apnea     Past Surgical History:  Procedure Laterality Date  . COLONOSCOPY    . WRIST SURGERY  01/24/2003   Right- ORIF: Dr. Amedeo Plenty    There were no vitals filed for this visit.   Subjective Assessment - 03/26/21 0849    Subjective He will have NCV test on monday. Feels about the same overall pain and stiffness still worse in the morning but then it gets better as he gets moving    Pertinent History lumbar surgery 07/09/20    Pain Onset More than a month ago                    [facility] Adult PT Treatment/Exercise - 03/26/21 0001      Neuro Re-ed    Neuro Re-ed Details  SLS, tandem walk      Lumbar Exercises: Stretches   Gastroc Stretch 30 seconds;4 reps    Gastroc Stretch Limitations slantboard      Lumbar Exercises: Aerobic   Recumbent Bike 8 min L4      Lumbar Exercises: Standing   Heel Raises Limitations  heel and toe raises 3X10    Other Standing Lumbar Exercises spanish squats with green resistance unilat X10 performed on both sides with chair touch      Ankle Exercises: Standing   Other Standing Ankle Exercises PF with L5 2x20, INV and EV with red 2X15, DF with red 2X15                    PT Short Term Goals - 02/17/21 0920      PT SHORT TERM GOAL #1   Title Pt will be I and compliant with HEP.    Time 4    Period Weeks    Status Achieved    Target Date 10/13/20      PT SHORT TERM GOAL #2   Title Pt will reduce overall back pain >25%    Baseline more numbness than pain now    Time 4    Period Weeks    Status Achieved             PT Long Term Goals -  03/17/21 0919      PT LONG TERM GOAL #1   Title Pt will reduce overall pain >50%    Baseline patient believe this goal has been met with current pain management    Time 12    Period Weeks    Status Achieved      PT LONG TERM GOAL #2   Title Patient will demonstrate independent use of home exercise program progression to facilitate ability to maintain/progress functional gains from skilled physical therapy services.    Baseline progressed today    Time 12    Period Weeks    Status Achieved      PT LONG TERM GOAL #3   Title Pt will improve lumbar ROM to [facility]    Baseline now met    Time 12    Period Weeks    Status Achieved      PT LONG TERM GOAL #4   Title Pt will be able to ambulate community distances without AD and return to normal ADLS    Baseline can amblate community distances now without AD but still not back to all his normal ADLs    Time 12    Period Weeks    Status Achieved      PT LONG TERM GOAL #5   Title [name] 4+ throughout    Baseline still with Lt ankle weakness 3+ to 4    Time 12    Period Weeks    Status On-going      Additional Long Term Goals   Additional Long Term Goals Yes      PT LONG TERM GOAL #6   Title He will be able to hold SLS on Lt leg  for 10 sec to show improved balance    Time 4    Period Weeks    Status New    Target Date 04/19/21                 Plan - 03/26/21 0920    Clinical Impression Statement Session focused on HEP review where we prioritized his ankle exercises to 6 times per week and his other lumbar exercises to 2-3 times per week. This is so that he is not overwhelmed by so many exercises and will focus more on his Lt ankle deficits as this continues to be his biggest overall deficit. We will await NCV test results next week.    Examination-Activity Limitations Bend;Dressing;Lift;Stand;Stairs;Squat;Sleep;Locomotion Level    Examination-Participation Restrictions Cleaning;Driving;Laundry;Yard Work;Shop    Stability/Clinical Decision Making Evolving/Moderate complexity    Rehab Potential Good    PT Frequency 2x / week    PT Duration 12 weeks   6-12 weeks   PT Treatment/Interventions ADLs/Self Care Home Management;Cryotherapy;Barrister's clerk;Therapeutic activities;Therapeutic exercise;Neuromuscular re-education;Manual techniques;Passive range of motion;Taping;Spinal Manipulations;Joint Manipulations;Dry needling;Balance training    PT Next Visit Plan focus on ankle and SLS and left knee TKE    PT Home Exercise Plan Access Code: [email or web address], added ankle 4 way, ankle alphabet and TKE 3 way    Consulted and Agree with Plan of Care Patient           Patient will benefit from skilled therapeutic intervention in order to improve the following deficits and impairments:  Abnormal gait,Decreased activity tolerance,Decreased balance,Decreased endurance,Decreased coordination,Decreased mobility,Decreased range of motion,Decreased strength,Difficulty walking,Increased muscle spasms,Pain,Improper body mechanics  Visit Diagnosis: Bilateral low back pain with left-sided sciatica, unspecified chronicity  Localized edema  Muscle weakness (generalized)  Other  abnormalities  of gait and mobility  Stiffness of right shoulder, not elsewhere classified  Right shoulder pain, unspecified chronicity     Problem List Patient Active Problem List   Diagnosis Date Noted  . ED (erectile dysfunction) 08/09/2012  . Hypercholesterolemia   . Chronic rhinitis   . Hyperlipidemia 02/29/2012    [name] 03/26/2021, 9:22 AM  [facility] Physical Therapy [address] [place], [place], [address] Phone: [phone]   Fax:  [phone]  Name: Jonathan Bridges MRN: [id number] Date of Birth: 1946-07-27

## 2021-03-26 NOTE — Patient Instructions (Signed)
Access Code: [email or web address] URL: [email or web address] Date: 03/26/2021 Prepared by: Elsie Ra  Exercises Single Leg Stance - 2 x daily - 6 x weekly - 1 sets - 3-5 reps - 10-20 sec or as long as you can hold Heel Toe Raises with Counter Support - 2 x daily - 6 x weekly - 10 reps - 2 sets Seated Ankle Plantarflexion with Resistance - 2 x daily - 6 x weekly - 3 sets - 10 reps Seated Ankle Eversion with Resistance - 2 x daily - 6 x weekly - 3 sets - 10 reps Seated Ankle Inversion with Resistance - 2 x daily - 6 x weekly - 3 sets - 10 reps Standing Lumbar Extension at Wall - Forearms - 2 x daily - 2-3 x weekly - 2-3 sets - 10 reps - 5 hold Hooklying Single Knee to Chest Stretch - 2 x daily - 2-3 x weekly - 2 reps - 1 sets - 20 hold Supine Bridge - 2 x daily - 2-3 x weekly - 10 reps - 1 sets - 5 hold Supine Active Straight Leg Raise - 2 x daily - 2-3 x weekly - 10 reps - 1-2 sets Seated Figure 4 Piriformis Stretch - 2 x daily - 2-3 x weekly - 1 sets - 3 reps - 30 hold Standing Terminal Knee Extension with Resistance - 2 x daily - 2-3 x weekly - 3 sets - 10-20 reps - 5 hold Squat - 2 x daily - 2-3 x weekly - 1-2 sets - 10 reps

## 2021-03-31 ENCOUNTER — Ambulatory Visit: Payer: Medicare PPO | Admitting: Physical Therapy

## 2021-03-31 ENCOUNTER — Other Ambulatory Visit: Payer: Self-pay

## 2021-03-31 DIAGNOSIS — M6281 Muscle weakness (generalized): Secondary | ICD-10-CM

## 2021-03-31 DIAGNOSIS — R600 Localized edema: Secondary | ICD-10-CM

## 2021-03-31 DIAGNOSIS — M5442 Lumbago with sciatica, left side: Secondary | ICD-10-CM

## 2021-03-31 DIAGNOSIS — R2689 Other abnormalities of gait and mobility: Secondary | ICD-10-CM | POA: Diagnosis not present

## 2021-03-31 NOTE — Therapy (Signed)
[place], [place], [address] Phone: [phone]   Fax:  [phone]  Physical Therapy Treatment  Patient Details  Name: Jonathan Bridges MRN: [id number] Date of Birth: [birth year] Referring Provider (PT): Judith Part, MD   Encounter Date: 03/31/2021   PT End of Session - 03/31/21 0843    Visit Number 17    Number of Visits 50    Date for PT Re-Evaluation 04/12/21    Authorization Type 12 more visits approaved until 5/8    Authorization - Visit Number 10    Authorization - Number of Visits 12    Progress Note Due on Visit 78    PT Start Time 0800    PT Stop Time 0845    PT Time Calculation (min) 45 min    Activity Tolerance Patient tolerated treatment well    Behavior During Therapy [name] for tasks assessed/performed           Past Medical History:  Diagnosis Date  . Anxiety   . Basal cell carcinoma   . Chronic rhinitis   . Clotting disorder (HCC)    right leg   . Depression   . Diverticulosis   . Elevated LFTs   . History of hypertension   . Hypercholesterolemia   . Sleep apnea     Past Surgical History:  Procedure Laterality Date  . COLONOSCOPY    . WRIST SURGERY  01/24/2003   Right- ORIF: Dr. Amedeo Plenty    There were no vitals filed for this visit.   Subjective Assessment - 03/31/21 0816    Subjective He had NVC test on monday but does not have the results yet. He still wakes up with pain but after tylenol and moving around he then feels pretty good    Pertinent History lumbar surgery 07/09/20    Pain Onset More than a month ago                  [facility] Adult PT Treatment/Exercise - 03/31/21 0001      Neuro Re-ed    Neuro Re-ed Details  Lt SLS 20-30 sec X3, tandem walk and fwd walk on foam X4, Bosu step overs X20      Lumbar Exercises: Stretches   Gastroc Stretch 3 reps;30 seconds    Gastroc Stretch Limitations slantboard      Lumbar Exercises: Aerobic   Recumbent Bike 8 min L4       Lumbar Exercises: Standing   Heel Raises Limitations heel and toe raises 3X10    Other Standing Lumbar Exercises spanish squats with green resistance unilat X10 performed on both sides with chair touch      Ankle Exercises: Supine   T-Band LLE for L5 for PF 2X 20, L3 for DF/INV/EV X 20    Other Supine Ankle Exercises ankle alphabet 3# X1                    PT Short Term Goals - 02/17/21 0920      PT SHORT TERM GOAL #1   Title Pt will be I and compliant with HEP.    Time 4    Period Weeks    Status Achieved    Target Date 10/13/20      PT SHORT TERM GOAL #2   Title Pt will reduce overall back pain >25%    Baseline more numbness than pain now    Time 4    Period Weeks  Status Achieved             PT Long Term Goals - 03/17/21 0919      PT LONG TERM GOAL #1   Title Pt will reduce overall pain >50%    Baseline patient believe this goal has been met with current pain management    Time 12    Period Weeks    Status Achieved      PT LONG TERM GOAL #2   Title Patient will demonstrate independent use of home exercise program progression to facilitate ability to maintain/progress functional gains from skilled physical therapy services.    Baseline progressed today    Time 12    Period Weeks    Status Achieved      PT LONG TERM GOAL #3   Title Pt will improve lumbar ROM to [facility]    Baseline now met    Time 12    Period Weeks    Status Achieved      PT LONG TERM GOAL #4   Title Pt will be able to ambulate community distances without AD and return to normal ADLS    Baseline can amblate community distances now without AD but still not back to all his normal ADLs    Time 12    Period Weeks    Status Achieved      PT LONG TERM GOAL #5   Title [name] 4+ throughout    Baseline still with Lt ankle weakness 3+ to 4    Time 12    Period Weeks    Status On-going      Additional Long Term Goals   Additional Long Term Goals Yes       PT LONG TERM GOAL #6   Title He will be able to hold SLS on Lt leg for 10 sec to show improved balance    Time 4    Period Weeks    Status New    Target Date 04/19/21                 Plan - 03/31/21 0844    Clinical Impression Statement We still dont have the results of NCV test so hopefully will get them by next session. Regardless we contineud to work to improve his deficits in Lt leg weakness paticularly ankle weakness and his overall balance and single leg stability on Lt. He will continue to benefit from PT.    Examination-Activity Limitations Bend;Dressing;Lift;Stand;Stairs;Squat;Sleep;Locomotion Level    Examination-Participation Restrictions Cleaning;Driving;Laundry;Yard Work;Shop    Stability/Clinical Decision Making Evolving/Moderate complexity    Rehab Potential Good    PT Frequency 2x / week    PT Duration 12 weeks   6-12 weeks   PT Treatment/Interventions ADLs/Self Care Home Management;Cryotherapy;Barrister's clerk;Therapeutic activities;Therapeutic exercise;Neuromuscular re-education;Manual techniques;Passive range of motion;Taping;Spinal Manipulations;Joint Manipulations;Dry needling;Balance training    PT Next Visit Plan focus on ankle and SLS and left knee TKE    PT Home Exercise Plan Access Code: [email or web address], added ankle 4 way, ankle alphabet and TKE 3 way    Consulted and Agree with Plan of Care Patient           Patient will benefit from skilled therapeutic intervention in order to improve the following deficits and impairments:  Abnormal gait,Decreased activity tolerance,Decreased balance,Decreased endurance,Decreased coordination,Decreased mobility,Decreased range of motion,Decreased strength,Difficulty walking,Increased muscle spasms,Pain,Improper body mechanics  Visit Diagnosis: Bilateral low back pain with left-sided sciatica, unspecified chronicity  Localized edema  Muscle weakness  (  generalized)  Other abnormalities of gait and mobility     Problem List Patient Active Problem List   Diagnosis Date Noted  . ED (erectile dysfunction) 08/09/2012  . Hypercholesterolemia   . Chronic rhinitis   . Hyperlipidemia 02/29/2012    [name] 03/31/2021, 8:46 AM  [facility] Physical Therapy [address] [place], [place], [address] Phone: [phone]   Fax:  [phone]  Name: Jonathan Bridges MRN: [id number] Date of Birth: 06-Sep-1946

## 2021-04-02 ENCOUNTER — Other Ambulatory Visit: Payer: Self-pay

## 2021-04-02 ENCOUNTER — Ambulatory Visit: Payer: Medicare PPO | Admitting: Physical Therapy

## 2021-04-02 DIAGNOSIS — R600 Localized edema: Secondary | ICD-10-CM | POA: Diagnosis not present

## 2021-04-02 DIAGNOSIS — M6281 Muscle weakness (generalized): Secondary | ICD-10-CM | POA: Diagnosis not present

## 2021-04-02 DIAGNOSIS — R2689 Other abnormalities of gait and mobility: Secondary | ICD-10-CM | POA: Diagnosis not present

## 2021-04-02 DIAGNOSIS — M5442 Lumbago with sciatica, left side: Secondary | ICD-10-CM

## 2021-04-02 NOTE — Therapy (Signed)
[place], [place], [address] Phone: [phone]   Fax:  [phone]  Physical Therapy Treatment  Patient Details  Name: Jonathan Bridges MRN: [id number] Date of Birth: July 25, 1946 Referring Provider (PT): Jonathan Part, MD   Encounter Date: 04/02/2021   PT End of Session - 04/02/21 0844    Visit Number 57    Number of Visits 50    Date for PT Re-Evaluation 04/12/21    Authorization Type 12 more visits approaved until 5/8    Authorization - Visit Number 11    Authorization - Number of Visits 12    Progress Note Due on Visit 40    PT Start Time 0800    PT Stop Time 9528    PT Time Calculation (min) 44 min    Activity Tolerance Patient tolerated treatment well    Behavior During Therapy [facility] for tasks assessed/performed           Past Medical History:  Diagnosis Date  . Anxiety   . Basal cell carcinoma   . Chronic rhinitis   . Clotting disorder (HCC)    right leg   . Depression   . Diverticulosis   . Elevated LFTs   . History of hypertension   . Hypercholesterolemia   . Sleep apnea     Past Surgical History:  Procedure Laterality Date  . COLONOSCOPY    . WRIST SURGERY  01/24/2003   Right- ORIF: Dr. Amedeo Plenty    There were no vitals filed for this visit.   Subjective Assessment - 04/02/21 0810    Subjective He still does not have NCV results yet and is frustrated with this. He feels about the same, has pain and stifness in the morning that gets better as he gets moving.    Pertinent History lumbar surgery 07/09/20    Pain Onset More than a month ago                             [facility] Adult PT Treatment/Exercise - 04/02/21 0001      Neuro Re-ed    Neuro Re-ed Details  Lt SLS 20-30 sec X3, tandem walk and fwd walk on foam X4, Bosu step overs X20      Lumbar Exercises: Stretches   Press photographer Left;3 reps;30 seconds    Gastroc Stretch Limitations standing      Lumbar Exercises:  Aerobic   Recumbent Bike 8 min L4      Lumbar Exercises: Standing   Heel Raises Limitations heel and toe raises 3X10    Other Standing Lumbar Exercises spanish squats with blue resistance unilat X10 performed on both sides with chair touch. standing lunges with one UE support X10 bilat    Other Standing Lumbar Exercises SL slider 5 directions X5 ea      Ankle Exercises: Supine   T-Band LLE for L5 for PF 2X 15, L3 for DF/INV/EV X 20    Other Supine Ankle Exercises ankle alphabet 3# X2                    PT Short Term Goals - 02/17/21 0920      PT SHORT TERM GOAL #1   Title Pt will be I and compliant with HEP.    Time 4    Period Weeks    Status Achieved    Target Date 10/13/20      PT SHORT TERM  GOAL #2   Title Pt will reduce overall back pain >25%    Baseline more numbness than pain now    Time 4    Period Weeks    Status Achieved             PT Long Term Goals - 03/17/21 0919      PT LONG TERM GOAL #1   Title Pt will reduce overall pain >50%    Baseline patient believe this goal has been met with current pain management    Time 12    Period Weeks    Status Achieved      PT LONG TERM GOAL #2   Title Patient will demonstrate independent use of home exercise program progression to facilitate ability to maintain/progress functional gains from skilled physical therapy services.    Baseline progressed today    Time 12    Period Weeks    Status Achieved      PT LONG TERM GOAL #3   Title Pt will improve lumbar ROM to [facility]    Baseline now met    Time 12    Period Weeks    Status Achieved      PT LONG TERM GOAL #4   Title Pt will be able to ambulate community distances without AD and return to normal ADLS    Baseline can amblate community distances now without AD but still not back to all his normal ADLs    Time 12    Period Weeks    Status Achieved      PT LONG TERM GOAL #5   Title [name] 4+ throughout    Baseline still  with Lt ankle weakness 3+ to 4    Time 12    Period Weeks    Status On-going      Additional Long Term Goals   Additional Long Term Goals Yes      PT LONG TERM GOAL #6   Title He will be able to hold SLS on Lt leg for 10 sec to show improved balance    Time 4    Period Weeks    Status New    Target Date 04/19/21                 Plan - 04/02/21 0845    Clinical Impression Statement Continued to work on his ankle strength, baalance and quad control. He does feel he is improving but very slow. Hopefully we will have NVC test results next week. He will continue to benefit from skilled PT to address these functional deficits.   Examination-Activity Limitations Bend;Dressing;Lift;Stand;Stairs;Squat;Sleep;Locomotion Level    Examination-Participation Restrictions Cleaning;Driving;Laundry;Yard Work;Shop    Stability/Clinical Decision Making Evolving/Moderate complexity    Rehab Potential Good    PT Frequency 2x / week    PT Duration 12 weeks   6-12 weeks   PT Treatment/Interventions ADLs/Self Care Home Management;Cryotherapy;Barrister's clerk;Therapeutic activities;Therapeutic exercise;Neuromuscular re-education;Manual techniques;Passive range of motion;Taping;Spinal Manipulations;Joint Manipulations;Dry needling;Balance training    PT Next Visit Plan focus on ankle and SLS and left knee TKE    PT Home Exercise Plan Access Code: [email or web address], added ankle 4 way, ankle alphabet and TKE 3 way    Consulted and Agree with Plan of Care Patient           Patient will benefit from skilled therapeutic intervention in order to improve the following deficits and impairments:  Abnormal gait,Decreased activity tolerance,Decreased balance,Decreased endurance,Decreased coordination,Decreased mobility,Decreased range of  motion,Decreased strength,Difficulty walking,Increased muscle spasms,Pain,Improper body mechanics  Visit Diagnosis: Bilateral low  back pain with left-sided sciatica, unspecified chronicity  Localized edema  Muscle weakness (generalized)  Other abnormalities of gait and mobility     Problem List Patient Active Problem List   Diagnosis Date Noted  . ED (erectile dysfunction) 08/09/2012  . Hypercholesterolemia   . Chronic rhinitis   . Hyperlipidemia 02/29/2012    [name] 04/02/2021, 9:13 AM  [facility] Physical Therapy [address] [place], [place], [address] Phone: [phone]   Fax:  [phone]  Name: Jonathan Bridges MRN: [id number] Date of Birth: 23-Nov-1946

## 2021-04-07 ENCOUNTER — Encounter: Payer: Self-pay | Admitting: Physical Therapy

## 2021-04-07 ENCOUNTER — Ambulatory Visit (INDEPENDENT_AMBULATORY_CARE_PROVIDER_SITE_OTHER): Payer: Medicare PPO | Admitting: Physical Therapy

## 2021-04-07 ENCOUNTER — Other Ambulatory Visit: Payer: Self-pay

## 2021-04-07 DIAGNOSIS — R2689 Other abnormalities of gait and mobility: Secondary | ICD-10-CM | POA: Diagnosis not present

## 2021-04-07 DIAGNOSIS — R600 Localized edema: Secondary | ICD-10-CM | POA: Diagnosis not present

## 2021-04-07 DIAGNOSIS — M6281 Muscle weakness (generalized): Secondary | ICD-10-CM | POA: Diagnosis not present

## 2021-04-07 DIAGNOSIS — M5442 Lumbago with sciatica, left side: Secondary | ICD-10-CM | POA: Diagnosis not present

## 2021-04-07 NOTE — Therapy (Signed)
Jonathan Bridges [place], [place], [address] Phone: [phone]   Fax:  [phone]  Physical Therapy Treatment/Progress note/Recert for more [employer] authorized visits  Referring diagnosis? M54.42 Treatment diagnosis? (if different than referring diagnosis) same What was this (referring dx) caused by? [x]  Surgery []  Fall []  Ongoing issue []  Arthritis []  Other: ____________  Laterality: []  Rt [x]  Lt []  Both  Check all possible CPT codes:      [x]  97110 (Therapeutic Exercise)  []  92507 (SLP Treatment)  [x]  95188 (Neuro Re-ed)   []  92526 (Swallowing Treatment)   [x]  97116 (Gait Training)   []  [id number] (Cognitive Training, 1st 15 minutes) [x]  97140 (Manual Therapy)   []  97130 (Cognitive Training, each add'l 15 minutes)  [x]  97530 (Therapeutic Activities)  []  Other, List CPT Code ____________    []  97535 (Self Care)       []  All codes above (97110 - 97535)  [x]  97012 (Mechanical Traction)  [x]  97014 (E-stim Unattended)  []  97032 (E-stim manual)  []  97033 (Ionto)  []  97035 (Ultrasound)  []  97760 (Orthotic Fit) []  97750 (Physical Performance Training) []  [id number] (Aquatic Therapy) []  97034 (Contrast Bath) []  [id number] (Paraffin) []  97597 (Wound Care 1st 20 sq cm) []  97598 (Wound Care each add'l 20 sq cm) []  97016 (Vasopneumatic Device) []  41660 (Orthotic Training) []  [id number] (Prosthetic Training)  Progress Note reporting period date 03/17/21 to 04/07/21  See below for objective and subjective measurements relating to patients progress with PT.   Patient Details  Name: Jonathan Bridges MRN: [id number] Date of Birth: 26-Aug-1946 Referring Provider (PT): Judith Part, MD   Encounter Date: 04/07/2021   PT End of Session - 04/07/21 0843    Visit Number 36    Number of Visits 60    Date for PT Re-Evaluation 05/19/21    Authorization Type 12 more visits approaved until 5/8    Authorization - Visit Number 12    Authorization - Number of  Visits 12    Progress Note Due on Visit 80    PT Start Time 0801    PT Stop Time 0845    PT Time Calculation (min) 44 min    Activity Tolerance Patient tolerated treatment well    Behavior During Therapy [facility] for tasks assessed/performed           Past Medical History:  Diagnosis Date  . Anxiety   . Basal cell carcinoma   . Chronic rhinitis   . Clotting disorder (HCC)    right leg   . Depression   . Diverticulosis   . Elevated LFTs   . History of hypertension   . Hypercholesterolemia   . Sleep apnea     Past Surgical History:  Procedure Laterality Date  . COLONOSCOPY    . WRIST SURGERY  01/24/2003   Right- ORIF: Dr. Amedeo Plenty    There were no vitals filed for this visit.   Subjective Assessment - 04/07/21 0824    Subjective He relays NCV test results were inconclusive and were complicated with swelling in his legs. His MD has recommended MRI and we has to call to get that scheduled. He still feels he needs PT as he still feels spastic, has decreasesd balance and coordination and weakness in his Lt ankle.    Pertinent History lumbar surgery 07/09/20    Pain Onset More than a month ago              [facility] PT Assessment - 04/07/21 0001  Assessment   Medical Diagnosis lumbar radiculopathy, post op disc herniation operation    Referring Provider (PT) Judith Part, MD    Onset Date/Surgical Date 07/09/20      AROM   Overall AROM Comments lumbar ROM WNL      Strength   Left Ankle Dorsiflexion 4/5    Left Ankle Plantar Flexion 4/5   but in standing 2/5 as can not yet perform SL calf raise   Left Ankle Inversion 4/5    Left Ankle Eversion 4/5            [facility] Adult PT Treatment/Exercise - 04/07/21 0001      Lumbar Exercises: Stretches   Double Knee to Chest Stretch 3 reps;20 seconds      Lumbar Exercises: Aerobic   Recumbent Bike 8 min L4      Lumbar Exercises: Standing   Heel Raises Limitations heel and toe raises 3X10    Other Standing Lumbar  Exercises spanish squats with blue resistance unilat Lt 2X10, Rt one set of 10 performed on both sides with chair touch.      Lumbar Exercises: Seated   Sit to Stand Limitations SL form 26 inchs. 2X10 on Lt, one set of 10 on Rt      Ankle Exercises: Supine   T-Band LLE for L5 for PF 2X 15, L3 for DF/INV/EV X 20    Other Supine Ankle Exercises ankle alphabet 3# X2                    PT Short Term Goals - 02/17/21 0920      PT SHORT TERM GOAL #1   Title Pt will be I and compliant with HEP.    Time 4    Period Weeks    Status Achieved    Target Date 10/13/20      PT SHORT TERM GOAL #2   Title Pt will reduce overall back pain >25%    Baseline more numbness than pain now    Time 4    Period Weeks    Status Achieved             PT Long Term Goals - 04/07/21 0945      PT LONG TERM GOAL #1   Title Pt will reduce overall pain >50%    Baseline patient believe this goal has been met with current pain management    Time 12    Period Weeks    Status Achieved      PT LONG TERM GOAL #2   Title Patient will demonstrate independent use of home exercise program progression to facilitate ability to maintain/progress functional gains from skilled physical therapy services.    Baseline progressed today    Time 12    Period Weeks    Status Achieved      PT LONG TERM GOAL #3   Title Pt will improve lumbar ROM to [facility]    Baseline now met    Time 12    Period Weeks    Status Achieved      PT LONG TERM GOAL #4   Title Pt will be able to ambulate community distances without AD and return to normal ADLS    Baseline can amblate community distances now without AD but still not back to all his normal ADLs    Time 12    Period Weeks    Status On-going      PT LONG TERM GOAL #5  Title [name] 4+ throughout    Baseline still with Lt ankle weakness 3+ to 4    Time 12    Period Weeks    Status On-going      PT LONG TERM GOAL #6   Title He will  be able to hold SLS on Lt leg for 10 sec to show improved balance    Baseline now about 6 secoonds on Lt    Time 4    Period Weeks    Status On-going                 Plan - 04/07/21 0941    Clinical Impression Statement Progress note and Recert today as he is at the end of his current PT plan of care. He continues to make slow but some progress with his Lt ankle strength, and overall balance and SL stability of his his Lt lower extremity however he does continue to have deficits in these areas. He has met 3/6 long term PT goals. He did have NCV test but results were inconclusive and MD is ordering MRI. PT recommending to continue with PT while we continue to address his functional deficits while we await further MRI results.    Examination-Activity Limitations Bend;Dressing;Lift;Stand;Stairs;Squat;Sleep;Locomotion Level    Examination-Participation Restrictions Cleaning;Driving;Laundry;Yard Work;Shop    Stability/Clinical Decision Making Evolving/Moderate complexity    Rehab Potential Good    PT Frequency 2x / week    PT Duration 12 weeks   6-12 weeks   PT Treatment/Interventions ADLs/Self Care Home Management;Cryotherapy;Barrister's clerk;Therapeutic activities;Therapeutic exercise;Neuromuscular re-education;Manual techniques;Passive range of motion;Taping;Spinal Manipulations;Joint Manipulations;Dry needling;Balance training    PT Next Visit Plan focus on ankle and SLS and left knee TKE    PT Home Exercise Plan Access Code: [email or web address], added ankle 4 way, ankle alphabet and TKE 3 way    Consulted and Agree with Plan of Care Patient           Patient will benefit from skilled therapeutic intervention in order to improve the following deficits and impairments:  Abnormal gait,Decreased activity tolerance,Decreased balance,Decreased endurance,Decreased coordination,Decreased mobility,Decreased range of motion,Decreased strength,Difficulty  walking,Increased muscle spasms,Pain,Improper body mechanics  Visit Diagnosis: Bilateral low back pain with left-sided sciatica, unspecified chronicity  Localized edema  Muscle weakness (generalized)  Other abnormalities of gait and mobility     Problem List Patient Active Problem List   Diagnosis Date Noted  . ED (erectile dysfunction) 08/09/2012  . Hypercholesterolemia   . Chronic rhinitis   . Hyperlipidemia 02/29/2012    Debbe Odea ,PT,DPT 04/07/2021, 9:48 AM  [facility] Physical Therapy [address] [place], [place], [address] Phone: [phone]   Fax:  [phone]  Name: Jonathan Bridges MRN: [id number] Date of Birth: 1946-09-21

## 2021-04-09 ENCOUNTER — Ambulatory Visit: Payer: Medicare PPO | Admitting: Physical Therapy

## 2021-04-09 ENCOUNTER — Other Ambulatory Visit: Payer: Self-pay

## 2021-04-09 DIAGNOSIS — R600 Localized edema: Secondary | ICD-10-CM | POA: Diagnosis not present

## 2021-04-09 DIAGNOSIS — M6281 Muscle weakness (generalized): Secondary | ICD-10-CM

## 2021-04-09 DIAGNOSIS — R2689 Other abnormalities of gait and mobility: Secondary | ICD-10-CM

## 2021-04-09 DIAGNOSIS — M5442 Lumbago with sciatica, left side: Secondary | ICD-10-CM | POA: Diagnosis not present

## 2021-04-09 NOTE — Therapy (Signed)
[place], [place], [address] Phone: [phone]   Fax:  [phone]  Physical Therapy Treatment  Patient Details  Name: Jonathan Bridges MRN: [id number] Date of Birth: July 30, 1946 Referring Provider (PT): Judith Part, MD   Encounter Date: 04/09/2021   PT End of Session - 04/09/21 0843    Visit Number 21    Number of Visits 60    Date for PT Re-Evaluation 05/19/21    Authorization Type 12 more visits approaved until 5/8    Authorization - Visit Number 1    Authorization - Number of Visits 12    Progress Note Due on Visit 68    PT Start Time 0802    PT Stop Time 0842    PT Time Calculation (min) 40 min    Activity Tolerance Patient tolerated treatment well    Behavior During Therapy [facility] for tasks assessed/performed           Past Medical History:  Diagnosis Date  . Anxiety   . Basal cell carcinoma   . Chronic rhinitis   . Clotting disorder (HCC)    right leg   . Depression   . Diverticulosis   . Elevated LFTs   . History of hypertension   . Hypercholesterolemia   . Sleep apnea     Past Surgical History:  Procedure Laterality Date  . COLONOSCOPY    . WRIST SURGERY  01/24/2003   Right- ORIF: Dr. Amedeo Plenty    There were no vitals filed for this visit.   Subjective Assessment - 04/09/21 0839    Subjective He will have MRI 5/10, then will meet with surgeon 2 days later to go over results.    Pertinent History lumbar surgery 07/09/20    Pain Onset More than a month ago                             [facility] Adult PT Treatment/Exercise - 04/09/21 0001      Neuro Re-ed    Neuro Re-ed Details  SLS Lt 5X15 sec      Lumbar Exercises: Aerobic   Recumbent Bike 8 min L4      Lumbar Exercises: Machines for Strengthening   Leg Press SL 75 lbs 3X10 on Lt, 2X10 on Rt      Lumbar Exercises: Standing   Heel Raises Limitations heel and toe raises 3X10      Lumbar Exercises: Seated   Sit to  Stand Limitations SL from 25.5 inch. 3X10 on Lt, 2 set of 10 on Rt      Ankle Exercises: Supine   T-Band LLE for L5 for PF 2X15, L3 for DF/INV/EV X 20    Other Supine Ankle Exercises ankle alphabet 3# X2                    PT Short Term Goals - 02/17/21 0920      PT SHORT TERM GOAL #1   Title Pt will be I and compliant with HEP.    Time 4    Period Weeks    Status Achieved    Target Date 10/13/20      PT SHORT TERM GOAL #2   Title Pt will reduce overall back pain >25%    Baseline more numbness than pain now    Time 4    Period Weeks    Status Achieved  PT Long Term Goals - 04/07/21 0945      PT LONG TERM GOAL #1   Title Pt will reduce overall pain >50%    Baseline patient believe this goal has been met with current pain management    Time 12    Period Weeks    Status Achieved      PT LONG TERM GOAL #2   Title Patient will demonstrate independent use of home exercise program progression to facilitate ability to maintain/progress functional gains from skilled physical therapy services.    Baseline progressed today    Time 12    Period Weeks    Status Achieved      PT LONG TERM GOAL #3   Title Pt will improve lumbar ROM to [facility]    Baseline now met    Time 12    Period Weeks    Status Achieved      PT LONG TERM GOAL #4   Title Pt will be able to ambulate community distances without AD and return to normal ADLS    Baseline can amblate community distances now without AD but still not back to all his normal ADLs    Time 12    Period Weeks    Status On-going      PT LONG TERM GOAL #5   Title [name] 4+ throughout    Baseline still with Lt ankle weakness 3+ to 4    Time 12    Period Weeks    Status On-going      PT LONG TERM GOAL #6   Title He will be able to hold SLS on Lt leg for 10 sec to show improved balance    Baseline now about 6 secoonds on Lt    Time 4    Period Weeks    Status On-going                  Plan - 04/09/21 0844    Clinical Impression Statement His overall strenght continues to slowly improve with his Lt leg but still with signs of decreased knee extension control and overall decreased ankle strength which we continued to focus on with PT.    Examination-Activity Limitations Bend;Dressing;Lift;Stand;Stairs;Squat;Sleep;Locomotion Level    Examination-Participation Restrictions Cleaning;Driving;Laundry;Yard Work;Shop    Stability/Clinical Decision Making Evolving/Moderate complexity    Rehab Potential Good    PT Frequency 2x / week    PT Duration 12 weeks   6-12 weeks   PT Treatment/Interventions ADLs/Self Care Home Management;Cryotherapy;Barrister's clerk;Therapeutic activities;Therapeutic exercise;Neuromuscular re-education;Manual techniques;Passive range of motion;Taping;Spinal Manipulations;Joint Manipulations;Dry needling;Balance training    PT Next Visit Plan focus on ankle and SLS and left knee TKE    PT Home Exercise Plan Access Code: [email or web address], added ankle 4 way, ankle alphabet and TKE 3 way    Consulted and Agree with Plan of Care Patient           Patient will benefit from skilled therapeutic intervention in order to improve the following deficits and impairments:  Abnormal gait,Decreased activity tolerance,Decreased balance,Decreased endurance,Decreased coordination,Decreased mobility,Decreased range of motion,Decreased strength,Difficulty walking,Increased muscle spasms,Pain,Improper body mechanics  Visit Diagnosis: Bilateral low back pain with left-sided sciatica, unspecified chronicity  Localized edema  Muscle weakness (generalized)  Other abnormalities of gait and mobility     Problem List Patient Active Problem List   Diagnosis Date Noted  . ED (erectile dysfunction) 08/09/2012  . Hypercholesterolemia   . Chronic rhinitis   . Hyperlipidemia 02/29/2012  [name] 04/09/2021, 8:45 AM  [facility] Physical Therapy [address] [place], [place], [address] Phone: [phone]   Fax:  [phone]  Name: Jonathan Bridges MRN: [id number] Date of Birth: [birth year]

## 2021-04-13 ENCOUNTER — Ambulatory Visit: Payer: Medicare PPO | Admitting: Physical Therapy

## 2021-04-13 ENCOUNTER — Other Ambulatory Visit: Payer: Self-pay

## 2021-04-13 DIAGNOSIS — M6281 Muscle weakness (generalized): Secondary | ICD-10-CM

## 2021-04-13 DIAGNOSIS — R2689 Other abnormalities of gait and mobility: Secondary | ICD-10-CM

## 2021-04-13 DIAGNOSIS — R600 Localized edema: Secondary | ICD-10-CM | POA: Diagnosis not present

## 2021-04-13 DIAGNOSIS — M5442 Lumbago with sciatica, left side: Secondary | ICD-10-CM | POA: Diagnosis not present

## 2021-04-13 NOTE — Therapy (Signed)
Jonathan Bridges, [place], [address] Phone: [phone]   Fax:  [phone]  Physical Therapy Treatment  Patient Details  Name: Jonathan Bridges MRN: [id number] Date of Birth: 1946-01-23 Referring Provider (PT): Jonathan Part, MD   Encounter Date: 04/13/2021   PT End of Session - 04/13/21 1011    Visit Number 50    Number of Visits 60    Date for PT Re-Evaluation 05/19/21    Authorization Type 12 more visits approaved until 5/8    Authorization - Visit Number 2    Authorization - Number of Visits 12    Progress Note Due on Visit 28    PT Start Time 0930    PT Stop Time 1011    PT Time Calculation (min) 41 min    Activity Tolerance Patient tolerated treatment well    Behavior During Therapy [facility] for tasks assessed/performed           Past Medical History:  Diagnosis Date  . Anxiety   . Basal cell carcinoma   . Chronic rhinitis   . Clotting disorder (HCC)    right leg   . Depression   . Diverticulosis   . Elevated LFTs   . History of hypertension   . Hypercholesterolemia   . Sleep apnea     Past Surgical History:  Procedure Laterality Date  . COLONOSCOPY    . WRIST SURGERY  01/24/2003   Right- ORIF: Dr. Amedeo Bridges    There were no vitals filed for this visit.   Subjective Assessment - 04/13/21 0946    Subjective He relays he walks slower than he would like and still limps at time.    Pertinent History lumbar surgery 07/09/20    Pain Onset More than a month ago           [facility] Adult PT Treatment/Exercise - 04/13/21 0001      Neuro Re-ed    Neuro Re-ed Details  SLS Lt 5X15 sec      Lumbar Exercises: Aerobic   Tread Mill 8 min 1.5 to 2 mph with UE support      Lumbar Exercises: Machines for Strengthening   Leg Press SL 81 lbs 3X10 on Lt,then 3X10 Rt      Lumbar Exercises: Standing   Heel Raises Limitations heel and toe raises 2X15    Other Standing Lumbar Exercises 6 inch step ups with contralateral  march 2X10 no UE support      Lumbar Exercises: Seated   Sit to Stand Limitations SL from 25.5 inch. 3X10 on Lt, 2 set of 10 on Rt      Ankle Exercises: Supine   T-Band LLE for L5 for PF 2X15, L3 for DF/INV/EV X 20    Other Supine Ankle Exercises ankle alphabet 3# X2                    PT Short Term Goals - 02/17/21 0920      PT SHORT TERM GOAL #1   Title Pt will be I and compliant with HEP.    Time 4    Period Weeks    Status Achieved    Target Date 10/13/20      PT SHORT TERM GOAL #2   Title Pt will reduce overall back pain >25%    Baseline more numbness than pain now    Time 4    Period Weeks    Status Achieved  PT Long Term Goals - 04/07/21 0945      PT LONG TERM GOAL #1   Title Pt will reduce overall pain >50%    Baseline patient believe this goal has been met with current pain management    Time 12    Period Weeks    Status Achieved      PT LONG TERM GOAL #2   Title Patient will demonstrate independent use of home exercise program progression to facilitate ability to maintain/progress functional gains from skilled physical therapy services.    Baseline progressed today    Time 12    Period Weeks    Status Achieved      PT LONG TERM GOAL #3   Title Pt will improve lumbar ROM to [facility]    Baseline now met    Time 12    Period Weeks    Status Achieved      PT LONG TERM GOAL #4   Title Pt will be able to ambulate community distances without AD and return to normal ADLS    Baseline can amblate community distances now without AD but still not back to all his normal ADLs    Time 12    Period Weeks    Status On-going      PT LONG TERM GOAL #5   Title [name] 4+ throughout    Baseline still with Lt ankle weakness 3+ to 4    Time 12    Period Weeks    Status On-going      PT LONG TERM GOAL #6   Title He will be able to hold SLS on Lt leg for 10 sec to show improved balance    Baseline now about 6 secoonds  on Lt    Time 4    Period Weeks    Status On-going                 Plan - 04/13/21 1011    Clinical Impression Statement Continued to work to improve Lt ankle strength and quad control and he had good overall tolerance to this without pain. He does still get discouraged at time with his deficits but he continues to show great effort with PT and was reminded about the progress he has made. He will continue to benefit from PT to maximize his funciton.    Examination-Activity Limitations Bend;Dressing;Lift;Stand;Stairs;Squat;Sleep;Locomotion Level    Examination-Participation Restrictions Cleaning;Driving;Laundry;Yard Work;Shop    Stability/Clinical Decision Making Evolving/Moderate complexity    Rehab Potential Good    PT Frequency 2x / week    PT Duration 12 weeks   6-12 weeks   PT Treatment/Interventions ADLs/Self Care Home Management;Cryotherapy;Barrister's clerk;Therapeutic activities;Therapeutic exercise;Neuromuscular re-education;Manual techniques;Passive range of motion;Taping;Spinal Manipulations;Joint Manipulations;Dry needling;Balance training    PT Next Visit Plan focus on ankle and SLS and left knee TKE    PT Home Exercise Plan Access Code: [email or web address], added ankle 4 way, ankle alphabet and TKE 3 way    Consulted and Agree with Plan of Care Patient           Patient will benefit from skilled therapeutic intervention in order to improve the following deficits and impairments:  Abnormal gait,Decreased activity tolerance,Decreased balance,Decreased endurance,Decreased coordination,Decreased mobility,Decreased range of motion,Decreased strength,Difficulty walking,Increased muscle spasms,Pain,Improper body mechanics  Visit Diagnosis: Bilateral low back pain with left-sided sciatica, unspecified chronicity  Localized edema  Muscle weakness (generalized)  Other abnormalities of gait and mobility     Problem List Patient  Active Problem List   Diagnosis Date Noted  . ED (erectile dysfunction) 08/09/2012  . Hypercholesterolemia   . Chronic rhinitis   . Hyperlipidemia 02/29/2012    Jonathan Bridges 04/13/2021, 10:13 AM  [facility] [address] [place], [place], [address] Phone: [phone]   Fax:  [phone]  Name: Jonathan Bridges MRN: [id number] Date of Birth: 01/23/1946

## 2021-04-15 ENCOUNTER — Encounter: Payer: Medicare PPO | Admitting: Physical Therapy

## 2021-04-20 ENCOUNTER — Encounter: Payer: Self-pay | Admitting: Physical Therapy

## 2021-04-20 ENCOUNTER — Other Ambulatory Visit: Payer: Self-pay

## 2021-04-20 ENCOUNTER — Ambulatory Visit: Payer: Medicare PPO | Admitting: Physical Therapy

## 2021-04-20 DIAGNOSIS — M6281 Muscle weakness (generalized): Secondary | ICD-10-CM

## 2021-04-20 DIAGNOSIS — M5442 Lumbago with sciatica, left side: Secondary | ICD-10-CM

## 2021-04-20 DIAGNOSIS — R600 Localized edema: Secondary | ICD-10-CM | POA: Diagnosis not present

## 2021-04-20 DIAGNOSIS — R2689 Other abnormalities of gait and mobility: Secondary | ICD-10-CM

## 2021-04-20 NOTE — Therapy (Signed)
[place], [place], [address] Phone: [phone]   Fax:  [phone]  Physical Therapy Treatment  Patient Details  Name: Jonathan Bridges MRN: [id number] Date of Birth: 16-Aug-1946 Referring Provider (PT): Judith Part, MD   Encounter Date: 04/20/2021   PT End of Session - 04/20/21 0938    Visit Number 51    Number of Visits 60    Date for PT Re-Evaluation 05/19/21    Authorization Type 12 more visits approaved until 6/20    Authorization - Visit Number 3    Authorization - Number of Visits 12    Progress Note Due on Visit 44    PT Start Time 0845    PT Stop Time 0931    PT Time Calculation (min) 46 min    Activity Tolerance Patient tolerated treatment well    Behavior During Therapy [facility] for tasks assessed/performed           Past Medical History:  Diagnosis Date  . Anxiety   . Basal cell carcinoma   . Chronic rhinitis   . Clotting disorder (HCC)    right leg   . Depression   . Diverticulosis   . Elevated LFTs   . History of hypertension   . Hypercholesterolemia   . Sleep apnea     Past Surgical History:  Procedure Laterality Date  . COLONOSCOPY    . WRIST SURGERY  01/24/2003   Right- ORIF: Dr. Amedeo Plenty    There were no vitals filed for this visit.   Subjective Assessment - 04/20/21 0925    Subjective He relays he will have MRI tommorow then meet with surgeon thursday to go over results    Pertinent History lumbar surgery 07/09/20    Pain Onset More than a month ago              [facility] PT Assessment - 04/20/21 0001      Strength   Left Ankle Dorsiflexion 4+/5    Left Ankle Plantar Flexion 4/5   but in standing 2/5 as can not yet perform SL calf raise   Left Ankle Inversion 4/5    Left Ankle Eversion 4/5            [facility] Adult PT Treatment/Exercise - 04/20/21 0001      Lumbar Exercises: Aerobic   Recumbent Bike 8 min L4      Lumbar Exercises: Standing   Heel Raises Limitations  heel and toe raises 2X15    Other Standing Lumbar Exercises 6 inch step ups with contralateral march 2X10 no UE support      Lumbar Exercises: Seated   Sit to Stand Limitations SL from 25.5 inch. 2X10 on Lt, 1 set of 10 on Rt      Ankle Exercises: Stretches   Soleus Stretch 2 reps;30 seconds    Gastroc Stretch 2 reps;30 seconds      Ankle Exercises: Supine   T-Band LLE for L5 for PF 2X15, L3 for DF/INV/EV X 20    Other Supine Ankle Exercises ankle alphabet 3# X2                    PT Short Term Goals - 02/17/21 0920      PT SHORT TERM GOAL #1   Title Pt will be I and compliant with HEP.    Time 4    Period Weeks    Status Achieved    Target Date 10/13/20  PT SHORT TERM GOAL #2   Title Pt will reduce overall back pain >25%    Baseline more numbness than pain now    Time 4    Period Weeks    Status Achieved             PT Long Term Goals - 04/07/21 0945      PT LONG TERM GOAL #1   Title Pt will reduce overall pain >50%    Baseline patient believe this goal has been met with current pain management    Time 12    Period Weeks    Status Achieved      PT LONG TERM GOAL #2   Title Patient will demonstrate independent use of home exercise program progression to facilitate ability to maintain/progress functional gains from skilled physical therapy services.    Baseline progressed today    Time 12    Period Weeks    Status Achieved      PT LONG TERM GOAL #3   Title Pt will improve lumbar ROM to [facility]    Baseline now met    Time 12    Period Weeks    Status Achieved      PT LONG TERM GOAL #4   Title Pt will be able to ambulate community distances without AD and return to normal ADLS    Baseline can amblate community distances now without AD but still not back to all his normal ADLs    Time 12    Period Weeks    Status On-going      PT LONG TERM GOAL #5   Title [name] 4+ throughout    Baseline still with Lt ankle  weakness 3+ to 4    Time 12    Period Weeks    Status On-going      PT LONG TERM GOAL #6   Title He will be able to hold SLS on Lt leg for 10 sec to show improved balance    Baseline now about 6 secoonds on Lt    Time 4    Period Weeks    Status On-going                 Plan - 04/20/21 0939    Clinical Impression Statement He will have new lumbar MRI tommorow so we will await results for this. He did show some improvment in ankle DF strength but continues to be limited by ankle EV, INV, and PF strength. Continue POC    Examination-Activity Limitations Bend;Dressing;Lift;Stand;Stairs;Squat;Sleep;Locomotion Level    Examination-Participation Restrictions Cleaning;Driving;Laundry;Yard Work;Shop    Stability/Clinical Decision Making Evolving/Moderate complexity    Rehab Potential Good    PT Frequency 2x / week    PT Duration 12 weeks   6-12 weeks   PT Treatment/Interventions ADLs/Self Care Home Management;Cryotherapy;Barrister's clerk;Therapeutic activities;Therapeutic exercise;Neuromuscular re-education;Manual techniques;Passive range of motion;Taping;Spinal Manipulations;Joint Manipulations;Dry needling;Balance training    PT Next Visit Plan focus on ankle and SLS and left knee TKE    PT Home Exercise Plan Access Code: [email or web address], added ankle 4 way, ankle alphabet and TKE 3 way    Consulted and Agree with Plan of Care Patient           Patient will benefit from skilled therapeutic intervention in order to improve the following deficits and impairments:  Abnormal gait,Decreased activity tolerance,Decreased balance,Decreased endurance,Decreased coordination,Decreased mobility,Decreased range of motion,Decreased strength,Difficulty walking,Increased muscle spasms,Pain,Improper body mechanics  Visit Diagnosis: Bilateral low back  pain with left-sided sciatica, unspecified chronicity  Localized edema  Muscle weakness  (generalized)  Other abnormalities of gait and mobility     Problem List Patient Active Problem List   Diagnosis Date Noted  . ED (erectile dysfunction) 08/09/2012  . Hypercholesterolemia   . Chronic rhinitis   . Hyperlipidemia 02/29/2012    [name] 04/20/2021, 9:41 AM  [facility] Physical Therapy [address] [place], [place], [address] Phone: [phone]   Fax:  [phone]  Name: FAVIAN KITTLESON MRN: [id number] Date of Birth: [birth year]

## 2021-04-22 ENCOUNTER — Other Ambulatory Visit: Payer: Self-pay

## 2021-04-22 ENCOUNTER — Ambulatory Visit: Payer: Medicare PPO | Admitting: Physical Therapy

## 2021-04-22 ENCOUNTER — Encounter: Payer: Self-pay | Admitting: Physical Therapy

## 2021-04-22 DIAGNOSIS — R2689 Other abnormalities of gait and mobility: Secondary | ICD-10-CM

## 2021-04-22 DIAGNOSIS — M25511 Pain in right shoulder: Secondary | ICD-10-CM

## 2021-04-22 DIAGNOSIS — M25611 Stiffness of right shoulder, not elsewhere classified: Secondary | ICD-10-CM

## 2021-04-22 DIAGNOSIS — M6281 Muscle weakness (generalized): Secondary | ICD-10-CM

## 2021-04-22 DIAGNOSIS — R600 Localized edema: Secondary | ICD-10-CM | POA: Diagnosis not present

## 2021-04-22 DIAGNOSIS — M5442 Lumbago with sciatica, left side: Secondary | ICD-10-CM

## 2021-04-22 NOTE — Therapy (Signed)
[place], [place], [address] Phone: [phone]   Fax:  [phone]  Physical Therapy Treatment  Patient Details  Name: Jonathan Bridges MRN: [id number] Date of Birth: 1946-01-16 Referring Provider (PT): Jonathan Part, MD   Encounter Date: 04/22/2021   PT End of Session - 04/22/21 0908    Visit Number 57    Number of Visits 60    Date for PT Re-Evaluation 05/19/21    Authorization Type 12 more visits approaved until 6/20    Authorization - Visit Number 4    Authorization - Number of Visits 12    Progress Note Due on Visit 27    PT Start Time 0845    PT Stop Time 0930    PT Time Calculation (min) 45 min    Activity Tolerance Patient tolerated treatment well    Behavior During Therapy [facility] for tasks assessed/performed           Past Medical History:  Diagnosis Date  . Anxiety   . Basal cell carcinoma   . Chronic rhinitis   . Clotting disorder (HCC)    right leg   . Depression   . Diverticulosis   . Elevated LFTs   . History of hypertension   . Hypercholesterolemia   . Sleep apnea     Past Surgical History:  Procedure Laterality Date  . COLONOSCOPY    . WRIST SURGERY  01/24/2003   Right- ORIF: Dr. Amedeo Bridges    There were no vitals filed for this visit.   Subjective Assessment - 04/22/21 0905    Subjective He had MRI today then will meet with surgeon thursday to go over results. He relays still about the same with his morning back pain and stiffnesss and still does not have the strength in his Lt ankle that he would like.    Pertinent History lumbar surgery 07/09/20    Pain Onset More than a month ago            [facility] Adult PT Treatment/Exercise - 04/22/21 0001      Neuro Re-ed    Neuro Re-ed Details  SLS Lt 5X15 sec, tandem balance on foam with Lt behind 30 sec X3      Lumbar Exercises: Stretches   Press photographer Limitations 2 reps gastroc, 2 reps soleus on slantboard 30 sec X3      Lumbar  Exercises: Aerobic   Recumbent Bike 8 min L4      Lumbar Exercises: Machines for Strengthening   Leg Press SL 87 lbs 3X10 on Lt,then 3X10 Rt (increased weight next time)      Lumbar Exercises: Standing   Heel Raises Limitations heel and toe raises 2X15 with 3 sec hold at top of heel raise    Other Standing Lumbar Exercises 6 inch step ups with contralateral march 2X10 no UE support      Lumbar Exercises: Seated   Sit to Stand Limitations SL from 25.5 inch. 2X10 on Lt, 1 set of 10 on Rt      Ankle Exercises: Supine   T-Band LLE for L5 for PF 2X15, L3 for DF/INV/EV X 20    Other Supine Ankle Exercises ankle alphabet 3# X2                    PT Short Term Goals - 02/17/21 0920      PT SHORT TERM GOAL #1   Title Pt will be I and compliant with HEP.  Time 4    Period Weeks    Status Achieved    Target Date 10/13/20      PT SHORT TERM GOAL #2   Title Pt will reduce overall back pain >25%    Baseline more numbness than pain now    Time 4    Period Weeks    Status Achieved             PT Long Term Goals - 04/07/21 0945      PT LONG TERM GOAL #1   Title Pt will reduce overall pain >50%    Baseline patient believe this goal has been met with current pain management    Time 12    Period Weeks    Status Achieved      PT LONG TERM GOAL #2   Title Patient will demonstrate independent use of home exercise program progression to facilitate ability to maintain/progress functional gains from skilled physical therapy services.    Baseline progressed today    Time 12    Period Weeks    Status Achieved      PT LONG TERM GOAL #3   Title Pt will improve lumbar ROM to [facility]    Baseline now met    Time 12    Period Weeks    Status Achieved      PT LONG TERM GOAL #4   Title Pt will be able to ambulate community distances without AD and return to normal ADLS    Baseline can amblate community distances now without AD but still not back to all his normal ADLs    Time 12     Period Weeks    Status On-going      PT LONG TERM GOAL #5   Title [name] 4+ throughout    Baseline still with Lt ankle weakness 3+ to 4    Time 12    Period Weeks    Status On-going      PT LONG TERM GOAL #6   Title He will be able to hold SLS on Lt leg for 10 sec to show improved balance    Baseline now about 6 secoonds on Lt    Time 4    Period Weeks    Status On-going                 Plan - 04/22/21 4196    Clinical Impression Statement He had MRI but will not meet with surgeon until tommorow to get results. Contineud to work to improve his deficits and he contineus to be most limited with Lt ankle and knee strength and SL balance.    Examination-Activity Limitations Bend;Dressing;Lift;Stand;Stairs;Squat;Sleep;Locomotion Level    Examination-Participation Restrictions Cleaning;Driving;Laundry;Yard Work;Shop    Stability/Clinical Decision Making Evolving/Moderate complexity    Rehab Potential Good    PT Frequency 2x / week    PT Duration 12 weeks   6-12 weeks   PT Treatment/Interventions ADLs/Self Care Home Management;Cryotherapy;Barrister's clerk;Therapeutic activities;Therapeutic exercise;Neuromuscular re-education;Manual techniques;Passive range of motion;Taping;Spinal Manipulations;Joint Manipulations;Dry needling;Balance training    PT Next Visit Plan focus on ankle and SLS and left knee TKE    PT Home Exercise Plan Access Code: [email or web address], added ankle 4 way, ankle alphabet and TKE 3 way    Consulted and Agree with Plan of Care Patient           Patient will benefit from skilled therapeutic intervention in order to improve the following deficits and impairments:  Abnormal gait,Decreased activity tolerance,Decreased balance,Decreased endurance,Decreased coordination,Decreased mobility,Decreased range of motion,Decreased strength,Difficulty walking,Increased muscle spasms,Pain,Improper  body mechanics  Visit Diagnosis: Bilateral low back pain with left-sided sciatica, unspecified chronicity  Localized edema  Muscle weakness (generalized)  Other abnormalities of gait and mobility  Stiffness of right shoulder, not elsewhere classified  Right shoulder pain, unspecified chronicity     Problem List Patient Active Problem List   Diagnosis Date Noted  . ED (erectile dysfunction) 08/09/2012  . Hypercholesterolemia   . Chronic rhinitis   . Hyperlipidemia 02/29/2012    [name] 04/22/2021, 9:28 AM  [facility] Physical Therapy [address] [place], [place], [address] Phone: [phone]   Fax:  [phone]  Name: Jonathan Bridges MRN: [id number] Date of Birth: Nov 24, 1946

## 2021-04-27 ENCOUNTER — Other Ambulatory Visit: Payer: Self-pay

## 2021-04-27 ENCOUNTER — Ambulatory Visit: Payer: Medicare PPO | Admitting: Physical Therapy

## 2021-04-27 ENCOUNTER — Encounter: Payer: Self-pay | Admitting: Physical Therapy

## 2021-04-27 DIAGNOSIS — R2689 Other abnormalities of gait and mobility: Secondary | ICD-10-CM

## 2021-04-27 DIAGNOSIS — M5442 Lumbago with sciatica, left side: Secondary | ICD-10-CM | POA: Diagnosis not present

## 2021-04-27 DIAGNOSIS — R600 Localized edema: Secondary | ICD-10-CM | POA: Diagnosis not present

## 2021-04-27 DIAGNOSIS — M6281 Muscle weakness (generalized): Secondary | ICD-10-CM

## 2021-04-27 NOTE — Therapy (Signed)
[place], [place], [address] Phone: [phone]   Fax:  [phone]  Physical Therapy Treatment  Patient Details  Name: Jonathan Bridges MRN: [id number] Date of Birth: 12-26-1945 Referring Provider (PT): Judith Part, MD   Encounter Date: 04/27/2021   PT End of Session - 04/27/21 0930    Visit Number 64    Number of Visits 60    Date for PT Re-Evaluation 05/19/21    Authorization Type 12 more visits approaved until 6/20    Authorization - Visit Number 5    Authorization - Number of Visits 12    Progress Note Due on Visit 68    PT Start Time 0845    PT Stop Time 0931    PT Time Calculation (min) 46 min    Activity Tolerance Patient tolerated treatment well    Behavior During Therapy [facility] for tasks assessed/performed           Past Medical History:  Diagnosis Date  . Anxiety   . Basal cell carcinoma   . Chronic rhinitis   . Clotting disorder (HCC)    right leg   . Depression   . Diverticulosis   . Elevated LFTs   . History of hypertension   . Hypercholesterolemia   . Sleep apnea     Past Surgical History:  Procedure Laterality Date  . COLONOSCOPY    . WRIST SURGERY  01/24/2003   Right- ORIF: Dr. Amedeo Plenty    There were no vitals filed for this visit.   Subjective Assessment - 04/27/21 0902    Subjective He met with MD to review new MRI results and patient reports that MD said there was no nerve impingment and nothing found on MRI that was of concern, that MD thinks it will just take more time for his nerve to improve the strenght of his Lt leg and ankle.    Pertinent History lumbar surgery 07/09/20    Currently in Pain? Yes    Pain Score 3     Pain Location Back   and left leg   Pain Orientation Left    Pain Descriptors / Indicators Aching;Tightness    Pain Type Chronic pain;Surgical pain    Pain Onset More than a month ago            [facility] Adult PT Treatment/Exercise - 04/27/21 0001       Neuro Re-ed    Neuro Re-ed Details  SLS Lt 5X15 sec, tandem balance on foam with Lt behind 30 sec X3      Lumbar Exercises: Stretches   Press photographer Limitations 2 reps gastroc, 2 reps soleus on slantboard 30 sec X3      Lumbar Exercises: Aerobic   Recumbent Bike 8 min L4      Lumbar Exercises: Machines for Strengthening   Leg Press SL 93 lbs 3X10 on Lt,then 3X10 Rt      Lumbar Exercises: Standing   Heel Raises Limitations heel and toe raises 3X10 with 3 sec hold at top of heel raise      Lumbar Exercises: Seated   Sit to Stand Limitations SL from 25.5 inch. 3X10 on Lt, 2 set of 10 on Rt      Ankle Exercises: Supine   T-Band LLE for L5 for PF 2X15, L3 for DF/INV/EV X 20    Other Supine Ankle Exercises ankle alphabet 3# X2  PT Short Term Goals - 02/17/21 0920      PT SHORT TERM GOAL #1   Title Pt will be I and compliant with HEP.    Time 4    Period Weeks    Status Achieved    Target Date 10/13/20      PT SHORT TERM GOAL #2   Title Pt will reduce overall back pain >25%    Baseline more numbness than pain now    Time 4    Period Weeks    Status Achieved             PT Long Term Goals - 04/07/21 0945      PT LONG TERM GOAL #1   Title Pt will reduce overall pain >50%    Baseline patient believe this goal has been met with current pain management    Time 12    Period Weeks    Status Achieved      PT LONG TERM GOAL #2   Title Patient will demonstrate independent use of home exercise program progression to facilitate ability to maintain/progress functional gains from skilled physical therapy services.    Baseline progressed today    Time 12    Period Weeks    Status Achieved      PT LONG TERM GOAL #3   Title Pt will improve lumbar ROM to [facility]    Baseline now met    Time 12    Period Weeks    Status Achieved      PT LONG TERM GOAL #4   Title Pt will be able to ambulate community distances without AD and return to normal ADLS     Baseline can amblate community distances now without AD but still not back to all his normal ADLs    Time 12    Period Weeks    Status On-going      PT LONG TERM GOAL #5   Title [name] 4+ throughout    Baseline still with Lt ankle weakness 3+ to 4    Time 12    Period Weeks    Status On-going      PT LONG TERM GOAL #6   Title He will be able to hold SLS on Lt leg for 10 sec to show improved balance    Baseline now about 6 secoonds on Lt    Time 4    Period Weeks    Status On-going                 Plan - 04/27/21 0930    Clinical Impression Statement It appears nothing alarming on recent MRI that does not show neural impingment. MD seems to think his overall progress can continue to improve with more time. We continued to focus on SLS, and left quad and ankle strength without any new complaints and good overall activity tolerance.    Examination-Activity Limitations Bend;Dressing;Lift;Stand;Stairs;Squat;Sleep;Locomotion Level    Examination-Participation Restrictions Cleaning;Driving;Laundry;Yard Work;Shop    Stability/Clinical Decision Making Evolving/Moderate complexity    Rehab Potential Good    PT Frequency 2x / week    PT Duration 12 weeks   6-12 weeks   PT Treatment/Interventions ADLs/Self Care Home Management;Cryotherapy;Barrister's clerk;Therapeutic activities;Therapeutic exercise;Neuromuscular re-education;Manual techniques;Passive range of motion;Taping;Spinal Manipulations;Joint Manipulations;Dry needling;Balance training    PT Next Visit Plan focus on ankle and SLS and left knee TKE    PT Home Exercise Plan Access Code: [email or web address], added ankle 4 way, ankle alphabet  and TKE 3 way    Consulted and Agree with Plan of Care Patient           Patient will benefit from skilled therapeutic intervention in order to improve the following deficits and impairments:  Abnormal gait,Decreased  activity tolerance,Decreased balance,Decreased endurance,Decreased coordination,Decreased mobility,Decreased range of motion,Decreased strength,Difficulty walking,Increased muscle spasms,Pain,Improper body mechanics  Visit Diagnosis: Bilateral low back pain with left-sided sciatica, unspecified chronicity  Localized edema  Muscle weakness (generalized)  Other abnormalities of gait and mobility     Problem List Patient Active Problem List   Diagnosis Date Noted  . ED (erectile dysfunction) 08/09/2012  . Hypercholesterolemia   . Chronic rhinitis   . Hyperlipidemia 02/29/2012    [name] 04/27/2021, 9:46 AM  [facility] Physical Therapy [address] [place], [place], [address] Phone: [phone]   Fax:  [phone]  Name: Jonathan Bridges MRN: [id number] Date of Birth: November 11, 1946

## 2021-04-29 ENCOUNTER — Encounter: Payer: Medicare PPO | Admitting: Physical Therapy

## 2021-05-07 ENCOUNTER — Other Ambulatory Visit: Payer: Self-pay

## 2021-05-07 ENCOUNTER — Encounter: Payer: Self-pay | Admitting: Physical Therapy

## 2021-05-07 ENCOUNTER — Ambulatory Visit: Payer: Medicare PPO | Admitting: Physical Therapy

## 2021-05-07 DIAGNOSIS — R600 Localized edema: Secondary | ICD-10-CM

## 2021-05-07 DIAGNOSIS — M5442 Lumbago with sciatica, left side: Secondary | ICD-10-CM | POA: Diagnosis not present

## 2021-05-07 DIAGNOSIS — M6281 Muscle weakness (generalized): Secondary | ICD-10-CM

## 2021-05-07 NOTE — Therapy (Signed)
[place], [place], [address] Phone: [phone]   Fax:  [phone]  Physical Therapy Treatment  Patient Details  Name: Jonathan Bridges MRN: [id number] Date of Birth: 04/02/1946 Referring Provider (PT): Judith Part, MD   Encounter Date: 05/07/2021   PT End of Session - 05/07/21 0842    Visit Number 85    Number of Visits 60    Date for PT Re-Evaluation 05/19/21    Authorization Type 12 more visits approaved until 6/20    Authorization - Visit Number 6    Authorization - Number of Visits 12    Progress Note Due on Visit 47    PT Start Time 0800    PT Stop Time 0845    PT Time Calculation (min) 45 min    Activity Tolerance Patient tolerated treatment well    Behavior During Therapy [facility] for tasks assessed/performed           Past Medical History:  Diagnosis Date  . Anxiety   . Basal cell carcinoma   . Chronic rhinitis   . Clotting disorder (HCC)    right leg   . Depression   . Diverticulosis   . Elevated LFTs   . History of hypertension   . Hypercholesterolemia   . Sleep apnea     Past Surgical History:  Procedure Laterality Date  . COLONOSCOPY    . WRIST SURGERY  01/24/2003   Right- ORIF: Dr. Amedeo Plenty    There were no vitals filed for this visit.   Subjective Assessment - 05/07/21 0822    Subjective He returns after vacation and relays he was able to walk a lot up to 8 miles a day but he can feel soreness and was not able to walk as fast as his wife. He will hear back from MD after he meets with his MD collegues to discuss his overall prognosis and recommendations.    Pertinent History lumbar surgery 07/09/20    Pain Onset More than a month ago                             [facility] Adult PT Treatment/Exercise - 05/07/21 0001      Neuro Re-ed    Neuro Re-ed Details  SLS Lt 5X15 sec, tandem balance on foam with Lt behind 30 sec X3      Lumbar Exercises: Stretches   Development worker, community Limitations 2 reps gastroc, 2 reps soleus on slantboard 30 sec X3      Lumbar Exercises: Aerobic   Recumbent Bike 8 min L4      Lumbar Exercises: Machines for Strengthening   Leg Jonathan 100 lbs 3X10 SL on both      Lumbar Exercises: Standing   Heel Raises Limitations eccentric heel raises 2X10, then heel and toe raises 20 reps    Other Standing Lumbar Exercises 6 inch step ups with contralateral march 2X10 no UE support      Lumbar Exercises: Seated   Sit to Stand Limitations SL from 25.5 inch. 2X10 on both      Ankle Exercises: Supine   T-Band LLE for L5 for PF 2X15, L3 for DF/INV/EV X 20    Other Supine Ankle Exercises ankle alphabet 3# X1, cirlcles X15                    PT Short Term Goals - 02/17/21 9093  PT SHORT TERM GOAL #1   Title Pt will be I and compliant with HEP.    Time 4    Period Weeks    Status Achieved    Target Date 10/13/20      PT SHORT TERM GOAL #2   Title Pt will reduce overall back pain >25%    Baseline more numbness than pain now    Time 4    Period Weeks    Status Achieved             PT Long Term Goals - 04/07/21 0945      PT LONG TERM GOAL #1   Title Pt will reduce overall pain >50%    Baseline patient believe this goal has been met with current pain management    Time 12    Period Weeks    Status Achieved      PT LONG TERM GOAL #2   Title Patient will demonstrate independent use of home exercise program progression to facilitate ability to maintain/progress functional gains from skilled physical therapy services.    Baseline progressed today    Time 12    Period Weeks    Status Achieved      PT LONG TERM GOAL #3   Title Pt will improve lumbar ROM to [facility]    Baseline now met    Time 12    Period Weeks    Status Achieved      PT LONG TERM GOAL #4   Title Pt will be able to ambulate community distances without AD and return to normal ADLS    Baseline can amblate community distances now without AD but  still not back to all his normal ADLs    Time 12    Period Weeks    Status On-going      PT LONG TERM GOAL #5   Title [name] 4+ throughout    Baseline still with Lt ankle weakness 3+ to 4    Time 12    Period Weeks    Status On-going      PT LONG TERM GOAL #6   Title He will be able to hold SLS on Lt leg for 10 sec to show improved balance    Baseline now about 6 secoonds on Lt    Time 4    Period Weeks    Status On-going                 Plan - 05/07/21 0843    Clinical Impression Statement He returns after vacation and I feel that he did overall pretty well being able to walk several miles [place] and [place] however he does still lack ankle strength, single leg balance, and overall coordination in his Left leg which was the focus of the session today with his exercises. Continue POC as PT remains medically necessary until he finishes up with the remaining authorized visits he has then we will transition to long term independent rehab at home.    Examination-Activity Limitations Bend;Dressing;Lift;Stand;Stairs;Squat;Sleep;Locomotion Level    Examination-Participation Restrictions Cleaning;Driving;Laundry;Yard Work;Shop    Stability/Clinical Decision Making Evolving/Moderate complexity    Rehab Potential Good    PT Frequency 2x / week    PT Duration 12 weeks   6-12 weeks   PT Treatment/Interventions ADLs/Self Care Home Management;Cryotherapy;Barrister's clerk;Therapeutic activities;Therapeutic exercise;Neuromuscular re-education;Manual techniques;Passive range of motion;Taping;Spinal Manipulations;Joint Manipulations;Dry needling;Balance training    PT Next Visit Plan focus on ankle and SLS  and left knee TKE    PT Home Exercise Plan Access Code: [email or web address], added ankle 4 way, ankle alphabet and TKE 3 way    Consulted and Agree with Plan of Care Patient           Patient will benefit from  skilled therapeutic intervention in order to improve the following deficits and impairments:  Abnormal gait,Decreased activity tolerance,Decreased balance,Decreased endurance,Decreased coordination,Decreased mobility,Decreased range of motion,Decreased strength,Difficulty walking,Increased muscle spasms,Pain,Improper body mechanics  Visit Diagnosis: Bilateral low back pain with left-sided sciatica, unspecified chronicity  Localized edema  Muscle weakness (generalized)     Problem List Patient Active Problem List   Diagnosis Date Noted  . ED (erectile dysfunction) 08/09/2012  . Hypercholesterolemia   . Chronic rhinitis   . Hyperlipidemia 02/29/2012    Jonathan Bridges 05/07/2021, 8:49 AM  [facility] Physical Therapy [address] [place], [place], [address] Phone: [phone]   Fax:  [phone]  Name: Jonathan Bridges MRN: [id number] Date of Birth: 1946-01-09

## 2021-05-08 ENCOUNTER — Ambulatory Visit: Payer: Medicare PPO | Admitting: Physical Therapy

## 2021-05-08 DIAGNOSIS — M6281 Muscle weakness (generalized): Secondary | ICD-10-CM | POA: Diagnosis not present

## 2021-05-08 DIAGNOSIS — R2689 Other abnormalities of gait and mobility: Secondary | ICD-10-CM | POA: Diagnosis not present

## 2021-05-08 DIAGNOSIS — R600 Localized edema: Secondary | ICD-10-CM

## 2021-05-08 DIAGNOSIS — M5442 Lumbago with sciatica, left side: Secondary | ICD-10-CM | POA: Diagnosis not present

## 2021-05-08 NOTE — Therapy (Signed)
[name], [place], [address] Phone: [phone]   Fax:  [phone]  Physical Therapy Treatment  Patient Details  Name: Jonathan Bridges MRN: [id number] Date of Birth: [birth year] Referring Provider (PT): Jonathan Part, MD   Encounter Date: 05/08/2021   PT End of Session - 05/08/21 0918    Visit Number 30    Number of Visits 60    Date for PT Re-Evaluation 05/19/21    Authorization Type 12 more visits approaved until 6/20    Authorization - Visit Number 7    Authorization - Number of Visits 12    Progress Note Due on Visit 53    PT Start Time 0845    PT Stop Time 0929    PT Time Calculation (min) 44 min    Activity Tolerance Patient tolerated treatment well    Behavior During Therapy [facility] for tasks assessed/performed           Past Medical History:  Diagnosis Date  . Anxiety   . Basal cell carcinoma   . Chronic rhinitis   . Clotting disorder (HCC)    right leg   . Depression   . Diverticulosis   . Elevated LFTs   . History of hypertension   . Hypercholesterolemia   . Sleep apnea     Past Surgical History:  Procedure Laterality Date  . COLONOSCOPY    . WRIST SURGERY  01/24/2003   Right- ORIF: Dr. Amedeo Bridges    There were no vitals filed for this visit.   Subjective Assessment - 05/08/21 0918    Subjective He releays feeling pretty good today, denies soreness from yesterdays session.    Pertinent History lumbar surgery 07/09/20    Pain Onset More than a month ago                             [facility] Adult PT Treatment/Exercise - 05/08/21 0001      Neuro Re-ed    Neuro Re-ed Details  SLS Lt 5X15 sec, tandem balance on foam with Lt behind 30 sec X3      Lumbar Exercises: Stretches   Press photographer Limitations 2 reps gastroc, 2 reps soleus on slantboard 30 sec X3      Lumbar Exercises: Aerobic   Recumbent Bike 8 min L4      Lumbar Exercises: Machines for Strengthening   Cybex  Knee Extension 55 lbs 3X10 eccentrics for Lt    Cybex Knee Flexion 55 lbs 3X10    Leg Press 106 lbs 3X10 SL on both      Lumbar Exercises: Standing   Heel Raises Limitations eccentric heel raises X10, then heel and toe raises 20 reps    Other Standing Lumbar Exercises 6 inch step ups with contralateral march 2X10 no UE support      Ankle Exercises: Supine   T-Band LLE for L5 for PF 2X15, L3 for DF/INV/EV X 20      Ankle Exercises: Sidelying   Ankle Eversion Left   2X15                   PT Short Term Goals - 02/17/21 0920      PT SHORT TERM GOAL #1   Title Pt will be I and compliant with HEP.    Time 4    Period Weeks    Status Achieved    Target Date 10/13/20  PT SHORT TERM GOAL #2   Title Pt will reduce overall back pain >25%    Baseline more numbness than pain now    Time 4    Period Weeks    Status Achieved             PT Long Term Goals - 04/07/21 0945      PT LONG TERM GOAL #1   Title Pt will reduce overall pain >50%    Baseline patient believe this goal has been met with current pain management    Time 12    Period Weeks    Status Achieved      PT LONG TERM GOAL #2   Title Patient will demonstrate independent use of home exercise program progression to facilitate ability to maintain/progress functional gains from skilled physical therapy services.    Baseline progressed today    Time 12    Period Weeks    Status Achieved      PT LONG TERM GOAL #3   Title Pt will improve lumbar ROM to [facility]    Baseline now met    Time 12    Period Weeks    Status Achieved      PT LONG TERM GOAL #4   Title Pt will be able to ambulate community distances without AD and return to normal ADLS    Baseline can amblate community distances now without AD but still not back to all his normal ADLs    Time 12    Period Weeks    Status On-going      PT LONG TERM GOAL #5   Title [name] 4+ throughout    Baseline still with Lt ankle  weakness 3+ to 4    Time 12    Period Weeks    Status On-going      PT LONG TERM GOAL #6   Title He will be able to hold SLS on Lt leg for 10 sec to show improved balance    Baseline now about 6 secoonds on Lt    Time 4    Period Weeks    Status On-going                 Plan - 05/08/21 0930    Clinical Impression Statement He showed improved balance today with both SLS and tandem balance on foam, he was able to progress resistance today with leg strength as well and this was his best session yet in terms of overall progress.    Examination-Activity Limitations Bend;Dressing;Lift;Stand;Stairs;Squat;Sleep;Locomotion Level    Examination-Participation Restrictions Cleaning;Driving;Laundry;Yard Work;Shop    Stability/Clinical Decision Making Evolving/Moderate complexity    Rehab Potential Good    PT Frequency 2x / week    PT Duration 12 weeks   6-12 weeks   PT Treatment/Interventions ADLs/Self Care Home Management;Cryotherapy;Barrister's clerk;Therapeutic activities;Therapeutic exercise;Neuromuscular re-education;Manual techniques;Passive range of motion;Taping;Spinal Manipulations;Joint Manipulations;Dry needling;Balance training    PT Next Visit Plan focus on ankle and SLS and left knee TKE    PT Home Exercise Plan Access Code: [email or web address], added ankle 4 way, ankle alphabet and TKE 3 way    Consulted and Agree with Plan of Care Patient           Patient will benefit from skilled therapeutic intervention in order to improve the following deficits and impairments:  Abnormal gait,Decreased activity tolerance,Decreased balance,Decreased endurance,Decreased coordination,Decreased mobility,Decreased range of motion,Decreased strength,Difficulty walking,Increased muscle spasms,Pain,Improper body mechanics  Visit Diagnosis: Bilateral low back  pain with left-sided sciatica, unspecified chronicity  Localized edema  Muscle weakness  (generalized)  Other abnormalities of gait and mobility     Problem List Patient Active Problem List   Diagnosis Date Noted  . ED (erectile dysfunction) 08/09/2012  . Hypercholesterolemia   . Chronic rhinitis   . Hyperlipidemia 02/29/2012    Jonathan Bridges 05/08/2021, 9:38 AM  [facility] Physical Therapy [address] [place], [place], [address] Phone: [phone]   Fax:  [phone]  Name: Jonathan Bridges MRN: [id number] Date of Birth: Nov 12, 1946

## 2021-05-12 ENCOUNTER — Ambulatory Visit: Payer: Medicare PPO | Admitting: Physical Therapy

## 2021-05-12 ENCOUNTER — Other Ambulatory Visit: Payer: Self-pay

## 2021-05-12 DIAGNOSIS — M5442 Lumbago with sciatica, left side: Secondary | ICD-10-CM | POA: Diagnosis not present

## 2021-05-12 DIAGNOSIS — R2689 Other abnormalities of gait and mobility: Secondary | ICD-10-CM | POA: Diagnosis not present

## 2021-05-12 DIAGNOSIS — M6281 Muscle weakness (generalized): Secondary | ICD-10-CM | POA: Diagnosis not present

## 2021-05-12 DIAGNOSIS — R600 Localized edema: Secondary | ICD-10-CM

## 2021-05-12 NOTE — Therapy (Signed)
[place], [place], [address] Phone: [phone]   Fax:  [phone]  Physical Therapy Treatment  Patient Details  Name: Jonathan Bridges MRN: [id number] Date of Birth: Dec 26, 1945 Referring Provider (PT): Judith Part, MD   Encounter Date: 05/12/2021   PT End of Session - 05/12/21 0848    Visit Number 53    Number of Visits 60    Date for PT Re-Evaluation 05/19/21    Authorization Type 12 more visits approaved until 6/20    Authorization - Visit Number 8    Authorization - Number of Visits 12    Progress Note Due on Visit 29    PT Start Time 0800    PT Stop Time 0843    PT Time Calculation (min) 43 min    Activity Tolerance Patient tolerated treatment well    Behavior During Therapy [facility] for tasks assessed/performed           Past Medical History:  Diagnosis Date  . Anxiety   . Basal cell carcinoma   . Chronic rhinitis   . Clotting disorder (HCC)    right leg   . Depression   . Diverticulosis   . Elevated LFTs   . History of hypertension   . Hypercholesterolemia   . Sleep apnea     Past Surgical History:  Procedure Laterality Date  . COLONOSCOPY    . WRIST SURGERY  01/24/2003   Right- ORIF: Dr. Amedeo Plenty    There were no vitals filed for this visit.   Subjective Assessment - 05/12/21 0826    Subjective He relays more stiffness than pain now    Pertinent History lumbar surgery 07/09/20    Pain Onset More than a month ago              [facility] PT Assessment - 05/12/21 0001      Assessment   Medical Diagnosis lumbar radiculopathy, post op disc herniation operation    Referring Provider (PT) Judith Part, MD      Single Leg Stance   Comments Lt leg 10 second avg, 15 sec max            [facility] Adult PT Treatment/Exercise - 05/12/21 0001      Neuro Re-ed    Neuro Re-ed Details  SLS Lt 5X15 sec,balance on bosu 1 min X2      Lumbar Exercises: Stretches   Gastroc Stretch Limitations 2  reps gastroc, 2 reps soleus on slantboard 30 sec X3      Lumbar Exercises: Aerobic   Nustep 8 min L7   he prefers recumbant bike but unavailable     Lumbar Exercises: Machines for Strengthening   Cybex Knee Extension 55 lbs 3X10 eccentrics for Lt    Cybex Knee Flexion 55 lbs 3X10    Leg Press 106 lbs 3X10 SL on both      Lumbar Exercises: Standing   Heel Raises Limitations eccentric heel raises 2X10, then heel and toe raises 20 reps    Other Standing Lumbar Exercises bosu step overs up with Lt and down with Rt X20      Lumbar Exercises: Seated   Sit to Stand Limitations SL from 25.5 inch. 2X10 on both      Ankle Exercises: Supine   T-Band LLE for L5 for PF 2X20, L3 for DF/INV/EV X 20    Other Supine Ankle Exercises ankle alphabet 3# X2, sidelying EV 3# X20      Ankle  Exercises: Sidelying   Ankle Eversion --                    PT Short Term Goals - 02/17/21 0920      PT SHORT TERM GOAL #1   Title Pt will be I and compliant with HEP.    Time 4    Period Weeks    Status Achieved    Target Date 10/13/20      PT SHORT TERM GOAL #2   Title Pt will reduce overall back pain >25%    Baseline more numbness than pain now    Time 4    Period Weeks    Status Achieved             PT Long Term Goals - 04/07/21 0945      PT LONG TERM GOAL #1   Title Pt will reduce overall pain >50%    Baseline patient believe this goal has been met with current pain management    Time 12    Period Weeks    Status Achieved      PT LONG TERM GOAL #2   Title Patient will demonstrate independent use of home exercise program progression to facilitate ability to maintain/progress functional gains from skilled physical therapy services.    Baseline progressed today    Time 12    Period Weeks    Status Achieved      PT LONG TERM GOAL #3   Title Pt will improve lumbar ROM to [facility]    Baseline now met    Time 12    Period Weeks    Status Achieved      PT LONG TERM GOAL #4   Title  Pt will be able to ambulate community distances without AD and return to normal ADLS    Baseline can amblate community distances now without AD but still not back to all his normal ADLs    Time 12    Period Weeks    Status On-going      PT LONG TERM GOAL #5   Title [name] 4+ throughout    Baseline still with Lt ankle weakness 3+ to 4    Time 12    Period Weeks    Status On-going      PT LONG TERM GOAL #6   Title He will be able to hold SLS on Lt leg for 10 sec to show improved balance    Baseline now about 6 secoonds on Lt    Time 4    Period Weeks    Status On-going                 Plan - 05/12/21 0857    Clinical Impression Statement He is showing overall progress with SLS ability on his Lt leg. He does still lack ankle stability and coordination in his Lt ankle/knee and will continue to benefit from skillled PT.    Examination-Activity Limitations Bend;Dressing;Lift;Stand;Stairs;Squat;Sleep;Locomotion Level    Examination-Participation Restrictions Cleaning;Driving;Laundry;Yard Work;Shop    Stability/Clinical Decision Making Evolving/Moderate complexity    Rehab Potential Good    PT Frequency 2x / week    PT Duration 12 weeks   6-12 weeks   PT Treatment/Interventions ADLs/Self Care Home Management;Cryotherapy;Barrister's clerk;Therapeutic activities;Therapeutic exercise;Neuromuscular re-education;Manual techniques;Passive range of motion;Taping;Spinal Manipulations;Joint Manipulations;Dry needling;Balance training    PT Next Visit Plan focus on ankle and SLS and left knee TKE    PT Home Exercise  Plan Access Code: [email or web address], added ankle 4 way, ankle alphabet and TKE 3 way    Consulted and Agree with Plan of Care Patient           Patient will benefit from skilled therapeutic intervention in order to improve the following deficits and impairments:  Abnormal gait,Decreased activity  tolerance,Decreased balance,Decreased endurance,Decreased coordination,Decreased mobility,Decreased range of motion,Decreased strength,Difficulty walking,Increased muscle spasms,Pain,Improper body mechanics  Visit Diagnosis: Bilateral low back pain with left-sided sciatica, unspecified chronicity  Localized edema  Muscle weakness (generalized)  Other abnormalities of gait and mobility     Problem List Patient Active Problem List   Diagnosis Date Noted  . ED (erectile dysfunction) 08/09/2012  . Hypercholesterolemia   . Chronic rhinitis   . Hyperlipidemia 02/29/2012    [name] 05/12/2021, 8:58 AM  [facility] Physical Therapy [address] [place], [place], [address] Phone: [phone]   Fax:  [phone]  Name: MANDY FITZWATER MRN: [id number] Date of Birth: [birth year]

## 2021-05-14 ENCOUNTER — Ambulatory Visit: Payer: Medicare PPO | Admitting: Physical Therapy

## 2021-05-14 ENCOUNTER — Other Ambulatory Visit: Payer: Self-pay

## 2021-05-14 DIAGNOSIS — M6281 Muscle weakness (generalized): Secondary | ICD-10-CM

## 2021-05-14 DIAGNOSIS — M5442 Lumbago with sciatica, left side: Secondary | ICD-10-CM | POA: Diagnosis not present

## 2021-05-14 DIAGNOSIS — R600 Localized edema: Secondary | ICD-10-CM | POA: Diagnosis not present

## 2021-05-14 DIAGNOSIS — R2689 Other abnormalities of gait and mobility: Secondary | ICD-10-CM | POA: Diagnosis not present

## 2021-05-14 NOTE — Therapy (Signed)
[place], [place], [address] Phone: [phone]   Fax:  [phone]  Physical Therapy Treatment  Patient Details  Name: Jonathan Bridges MRN: [id number] Date of Birth: 04/26/1946 Referring Provider (PT): Judith Part, MD   Encounter Date: 05/14/2021   PT End of Session - 05/14/21 0824    Visit Number 34    Number of Visits 60    Date for PT Re-Evaluation 05/19/21    Authorization Type 12 more visits approaved until 6/20    Authorization - Visit Number 9    Authorization - Number of Visits 12    Progress Note Due on Visit 50    PT Start Time 0803    PT Stop Time 0845    PT Time Calculation (min) 42 min    Activity Tolerance Patient tolerated treatment well    Behavior During Therapy [facility] for tasks assessed/performed           Past Medical History:  Diagnosis Date  . Anxiety   . Basal cell carcinoma   . Chronic rhinitis   . Clotting disorder (HCC)    right leg   . Depression   . Diverticulosis   . Elevated LFTs   . History of hypertension   . Hypercholesterolemia   . Sleep apnea     Past Surgical History:  Procedure Laterality Date  . COLONOSCOPY    . WRIST SURGERY  01/24/2003   Right- ORIF: Dr. Amedeo Plenty    There were no vitals filed for this visit.   Subjective Assessment - 05/14/21 2620    Subjective He relays he is suppossed to have phone call with his surgeon next week after they discusss his prognosis with this collegues. He does relay some overall improvments but he remains frustrated with his stiffness and Lt leg weakness.    Pertinent History lumbar surgery 07/09/20    Pain Onset More than a month ago             [facility] Adult PT Treatment/Exercise - 05/14/21 0001      Neuro Re-ed    Neuro Re-ed Details  SLS Lt 5X15 sec, tandem walk 3 round trips      Lumbar Exercises: Stretches   Gastroc Stretch Limitations 2 reps gastroc, 2 reps soleus on slantboard 30 sec X3      Lumbar Exercises:  Aerobic   Recumbent Bike 8 min L4      Lumbar Exercises: Machines for Strengthening   Leg Press 112 lbs 3X10 SL on both      Lumbar Exercises: Standing   Heel Raises Limitations eccentric heel raises 2X10, then heel and toe raises 20 reps    Other Standing Lumbar Exercises step ups with alt leg march on 8 inch step 2X10 for Lt      Lumbar Exercises: Seated   Sit to Stand Limitations SL from 25.5 inch. 2X10 on both      Ankle Exercises: Supine   T-Band LLE for L5 for PF 2X20, L3 for DF/INV/EV X 20    Other Supine Ankle Exercises ankle alphabet 3# X2, sidelying EV 3# X20                    PT Short Term Goals - 02/17/21 0920      PT SHORT TERM GOAL #1   Title Pt will be I and compliant with HEP.    Time 4    Period Weeks    Status Achieved  Target Date 10/13/20      PT SHORT TERM GOAL #2   Title Pt will reduce overall back pain >25%    Baseline more numbness than pain now    Time 4    Period Weeks    Status Achieved             PT Long Term Goals - 04/07/21 0945      PT LONG TERM GOAL #1   Title Pt will reduce overall pain >50%    Baseline patient believe this goal has been met with current pain management    Time 12    Period Weeks    Status Achieved      PT LONG TERM GOAL #2   Title Patient will demonstrate independent use of home exercise program progression to facilitate ability to maintain/progress functional gains from skilled physical therapy services.    Baseline progressed today    Time 12    Period Weeks    Status Achieved      PT LONG TERM GOAL #3   Title Pt will improve lumbar ROM to [facility]    Baseline now met    Time 12    Period Weeks    Status Achieved      PT LONG TERM GOAL #4   Title Pt will be able to ambulate community distances without AD and return to normal ADLS    Baseline can amblate community distances now without AD but still not back to all his normal ADLs    Time 12    Period Weeks    Status On-going      PT LONG  TERM GOAL #5   Title [name] 4+ throughout    Baseline still with Lt ankle weakness 3+ to 4    Time 12    Period Weeks    Status On-going      PT LONG TERM GOAL #6   Title He will be able to hold SLS on Lt leg for 10 sec to show improved balance    Baseline now about 6 secoonds on Lt    Time 4    Period Weeks    Status On-going                 Plan - 05/14/21 0825    Clinical Impression Statement We continued to focus on his SLS ability but this does still remain inconsistent. Overall though he does continue to make small and slow progress overall on his functionaal activities he does still lack Lt quad control and ankle weakness/stability that we will continue to try to progress as able.    Examination-Activity Limitations Bend;Dressing;Lift;Stand;Stairs;Squat;Sleep;Locomotion Level    Examination-Participation Restrictions Cleaning;Driving;Laundry;Yard Work;Shop    Stability/Clinical Decision Making Evolving/Moderate complexity    Rehab Potential Good    PT Frequency 2x / week    PT Duration 12 weeks   6-12 weeks   PT Treatment/Interventions ADLs/Self Care Home Management;Cryotherapy;Barrister's clerk;Therapeutic activities;Therapeutic exercise;Neuromuscular re-education;Manual techniques;Passive range of motion;Taping;Spinal Manipulations;Joint Manipulations;Dry needling;Balance training    PT Next Visit Plan focus on ankle and SLS and left knee TKE    PT Home Exercise Plan Access Code: [email or web address], added ankle 4 way, ankle alphabet and TKE 3 way    Consulted and Agree with Plan of Care Patient           Patient will benefit from skilled therapeutic intervention in order to improve the following deficits and impairments:  Abnormal gait,Decreased  activity tolerance,Decreased balance,Decreased endurance,Decreased coordination,Decreased mobility,Decreased range of motion,Decreased strength,Difficulty  walking,Increased muscle spasms,Pain,Improper body mechanics  Visit Diagnosis: Bilateral low back pain with left-sided sciatica, unspecified chronicity  Localized edema  Muscle weakness (generalized)  Other abnormalities of gait and mobility     Problem List Patient Active Problem List   Diagnosis Date Noted  . ED (erectile dysfunction) 08/09/2012  . Hypercholesterolemia   . Chronic rhinitis   . Hyperlipidemia 02/29/2012    [name] 05/14/2021, 8:56 AM  [facility] Physical Therapy [address] [place], [place], [address] Phone: [phone]   Fax:  [phone]  Name: Jonathan Bridges MRN: [id number] Date of Birth: 1946-01-31

## 2021-05-18 ENCOUNTER — Ambulatory Visit: Payer: Medicare PPO | Admitting: Physical Therapy

## 2021-05-18 ENCOUNTER — Encounter: Payer: Self-pay | Admitting: Physical Therapy

## 2021-05-18 ENCOUNTER — Other Ambulatory Visit: Payer: Self-pay

## 2021-05-18 DIAGNOSIS — M5442 Lumbago with sciatica, left side: Secondary | ICD-10-CM | POA: Diagnosis not present

## 2021-05-18 DIAGNOSIS — R2689 Other abnormalities of gait and mobility: Secondary | ICD-10-CM

## 2021-05-18 DIAGNOSIS — R600 Localized edema: Secondary | ICD-10-CM | POA: Diagnosis not present

## 2021-05-18 DIAGNOSIS — M6281 Muscle weakness (generalized): Secondary | ICD-10-CM

## 2021-05-18 NOTE — Therapy (Signed)
[facility] Physical Therapy [address] [place], [place], [address] Phone: [phone]   Fax:  [phone]  Physical Therapy Treatment/Progress note Progress Note reporting period date 04/07/21 to 05/18/21  See below for objective and subjective measurements relating to patients progress with PT.   Patient Details  Name: Jonathan Bridges MRN: [id number] Date of Birth: 03-16-1946 Referring Provider (PT): Judith Part, MD   Encounter Date: 05/18/2021   PT End of Session - 05/18/21 0832    Visit Number 32    Number of Visits 60    Date for PT Re-Evaluation 05/19/21    Authorization Type 12 more visits approaved until 6/20    Authorization - Visit Number 10    Authorization - Number of Visits 12    Progress Note Due on Visit 3    PT Start Time 0803    PT Stop Time 0845    PT Time Calculation (min) 42 min    Activity Tolerance Patient tolerated treatment well    Behavior During Therapy [facility] for tasks assessed/performed           Past Medical History:  Diagnosis Date  . Anxiety   . Basal cell carcinoma   . Chronic rhinitis   . Clotting disorder (HCC)    right leg   . Depression   . Diverticulosis   . Elevated LFTs   . History of hypertension   . Hypercholesterolemia   . Sleep apnea     Past Surgical History:  Procedure Laterality Date  . COLONOSCOPY    . WRIST SURGERY  01/24/2003   Right- ORIF: Dr. Amedeo Plenty    There were no vitals filed for this visit.   Subjective Assessment - 05/18/21 0831    Subjective He relays about the same since last visit, pain and stiffness first thing in the morning but then gets better as he moves around.    Pertinent History lumbar surgery 07/09/20    Pain Onset More than a month ago              [facility] PT Assessment - 05/18/21 0001      Assessment   Medical Diagnosis lumbar radiculopathy, post op disc herniation operation    Referring Provider (PT) Judith Part, MD      Single Leg Stance    Comments 12 second max on Lt      AROM   Overall AROM Comments lumbar ROM WNL      Strength   Right Knee Flexion 5/5    Right Knee Extension 5/5    Left Knee Flexion 5/5    Left Knee Extension 5/5    Left Ankle Dorsiflexion 4+/5    Left Ankle Plantar Flexion 4/5   2/5 in standing   Left Ankle Inversion 4/5    Left Ankle Eversion 4/5                         [facility] Adult PT Treatment/Exercise - 05/18/21 0001      Lumbar Exercises: Stretches   Gastroc Stretch Limitations 2 reps gastroc, 2 reps soleus on slantboard 30 sec X3      Lumbar Exercises: Aerobic   Recumbent Bike 8 min L4      Lumbar Exercises: Machines for Strengthening   Cybex Knee Extension 55 lbs 3X10 eccentrics for Lt    Cybex Knee Flexion 55 lbs 3X10    Leg Press 112 lbs 3X10 SL on both  Lumbar Exercises: Standing   Heel Raises Limitations eccentric heel raises X15, then heel and toe raises 20 reps holding 3 sec    Other Standing Lumbar Exercises step ups with alt leg march on 8 inch step 2X10 for both    Other Standing Lumbar Exercises SLS 12 sec best X 5 attempts      Ankle Exercises: Supine   T-Band LLE for L5 and L3 together for PF 2X20, L3 for DF/INV/EV X 20    Other Supine Ankle Exercises ankle alphabet 4# X2,                    PT Short Term Goals - 02/17/21 0920      PT SHORT TERM GOAL #1   Title Pt will be I and compliant with HEP.    Time 4    Period Weeks    Status Achieved    Target Date 10/13/20      PT SHORT TERM GOAL #2   Title Pt will reduce overall back pain >25%    Baseline more numbness than pain now    Time 4    Period Weeks    Status Achieved             PT Long Term Goals - 04/07/21 0945      PT LONG TERM GOAL #1   Title Pt will reduce overall pain >50%    Baseline patient believe this goal has been met with current pain management    Time 12    Period Weeks    Status Achieved      PT LONG TERM GOAL #2   Title Patient will demonstrate  independent use of home exercise program progression to facilitate ability to maintain/progress functional gains from skilled physical therapy services.    Baseline progressed today    Time 12    Period Weeks    Status Achieved      PT LONG TERM GOAL #3   Title Pt will improve lumbar ROM to [facility]    Baseline now met    Time 12    Period Weeks    Status Achieved      PT LONG TERM GOAL #4   Title Pt will be able to ambulate community distances without AD and return to normal ADLS    Baseline can amblate community distances now without AD but still not back to all his normal ADLs    Time 12    Period Weeks    Status On-going      PT LONG TERM GOAL #5   Title [name] 4+ throughout    Baseline still with Lt ankle weakness 3+ to 4    Time 12    Period Weeks    Status On-going      PT LONG TERM GOAL #6   Title He will be able to hold SLS on Lt leg for 10 sec to show improved balance    Baseline now about 6 secoonds on Lt    Time 4    Period Weeks    Status On-going                 Plan - 05/18/21 0901    Clinical Impression Statement Progress has been slow and inconsistent overall but he does have some small improvments in quad muscle control and SLS. He has 2 more visits left in his current PT plan of care and we will work to  transisiton him to independent rehab at home to continue to work on his remaining deficits in ankle strength, quad control and balace. His ROM is doing well and pain levels are also doing well.    Examination-Activity Limitations Bend;Dressing;Lift;Stand;Stairs;Squat;Sleep;Locomotion Level    Examination-Participation Restrictions Cleaning;Driving;Laundry;Yard Work;Shop    Stability/Clinical Decision Making Evolving/Moderate complexity    Rehab Potential Good    PT Frequency 2x / week    PT Duration 12 weeks   6-12 weeks   PT Treatment/Interventions ADLs/Self Care Home Management;Cryotherapy;Chief Technology Officer;Therapeutic activities;Therapeutic exercise;Neuromuscular re-education;Manual techniques;Passive range of motion;Taping;Spinal Manipulations;Joint Manipulations;Dry needling;Balance training    PT Next Visit Plan work to independent program over next visits.    PT Home Exercise Plan Access Code: [email or web address], added ankle 4 way, ankle alphabet and TKE 3 way    Consulted and Agree with Plan of Care Patient           Patient will benefit from skilled therapeutic intervention in order to improve the following deficits and impairments:  Abnormal gait,Decreased activity tolerance,Decreased balance,Decreased endurance,Decreased coordination,Decreased mobility,Decreased range of motion,Decreased strength,Difficulty walking,Increased muscle spasms,Pain,Improper body mechanics  Visit Diagnosis: Bilateral low back pain with left-sided sciatica, unspecified chronicity  Localized edema  Muscle weakness (generalized)  Other abnormalities of gait and mobility     Problem List Patient Active Problem List   Diagnosis Date Noted  . ED (erectile dysfunction) 08/09/2012  . Hypercholesterolemia   . Chronic rhinitis   . Hyperlipidemia 02/29/2012    Debbe Odea, PT,DPT 05/18/2021, 9:04 AM  [facility] Physical Therapy [address] [place], [place], [address] Phone: [phone]   Fax:  [phone]  Name: DAIMON KEAN MRN: [id number] Date of Birth: November 23, 1946

## 2021-05-20 ENCOUNTER — Encounter: Payer: Self-pay | Admitting: Physical Therapy

## 2021-05-20 ENCOUNTER — Other Ambulatory Visit: Payer: Self-pay

## 2021-05-20 ENCOUNTER — Ambulatory Visit (INDEPENDENT_AMBULATORY_CARE_PROVIDER_SITE_OTHER): Payer: Medicare PPO | Admitting: Physical Therapy

## 2021-05-20 DIAGNOSIS — M5442 Lumbago with sciatica, left side: Secondary | ICD-10-CM | POA: Diagnosis not present

## 2021-05-20 DIAGNOSIS — R2689 Other abnormalities of gait and mobility: Secondary | ICD-10-CM

## 2021-05-20 DIAGNOSIS — R600 Localized edema: Secondary | ICD-10-CM | POA: Diagnosis not present

## 2021-05-20 DIAGNOSIS — M6281 Muscle weakness (generalized): Secondary | ICD-10-CM

## 2021-05-20 NOTE — Therapy (Signed)
[place], [place], [address] Phone: [phone]   Fax:  [phone]  Physical Therapy Treatment  Patient Details  Name: Jonathan Bridges MRN: [id number] Date of Birth: 01-27-1946 Referring Provider (PT): Judith Part, MD   Encounter Date: 05/20/2021   PT End of Session - 05/20/21 0958    Visit Number 59    Number of Visits 60    Date for PT Re-Evaluation 05/19/21    Authorization Type 12 more visits approaved until 6/20    Authorization - Visit Number 11    Authorization - Number of Visits 12    Progress Note Due on Visit 57    PT Start Time 0802    PT Stop Time 9030    PT Time Calculation (min) 45 min    Activity Tolerance Patient tolerated treatment well    Behavior During Therapy [facility] for tasks assessed/performed           Past Medical History:  Diagnosis Date  . Anxiety   . Basal cell carcinoma   . Chronic rhinitis   . Clotting disorder (HCC)    right leg   . Depression   . Diverticulosis   . Elevated LFTs   . History of hypertension   . Hypercholesterolemia   . Sleep apnea     Past Surgical History:  Procedure Laterality Date  . COLONOSCOPY    . WRIST SURGERY  01/24/2003   Right- ORIF: Dr. Amedeo Plenty    There were no vitals filed for this visit.   Subjective Assessment - 05/20/21 0953    Subjective He plans to join the [facility] and go 3 times a week when he finishes up with PT next visit    Pertinent History lumbar surgery 07/09/20    Pain Onset More than a month ago            [facility] Adult PT Treatment/Exercise - 05/20/21 0001      Neuro Re-ed    Neuro Re-ed Details  SLS Lt 5X15 sec      Lumbar Exercises: Stretches   Gastroc Stretch Limitations 2 reps gastroc, 2 reps soleus on slantboard 30 sec X3      Lumbar Exercises: Aerobic   Tread Mill 8 min 2 mph with UE support 1-3% incline      Lumbar Exercises: Machines for Strengthening   Cybex Knee Extension 55 lbs 3X10 eccentrics for Lt     Cybex Knee Flexion 55 lbs 3X10    Leg Press 112 lbs 3X10 SL on both      Lumbar Exercises: Standing   Heel Raises Limitations eccentric heel raises X15, then heel and toe raises 20 reps holding 3 sec    Other Standing Lumbar Exercises step ups with alt leg march on 8 inch step 2X10 for both      Lumbar Exercises: Seated   Sit to Stand Limitations SL from 25.5 inch. 2X10 on both      Ankle Exercises: Supine   T-Band LLE for L5 and L3 together for PF 2X20, L3 for DF/INV/EV X 20    Other Supine Ankle Exercises ankle alphabet 4# X2,                    PT Short Term Goals - 02/17/21 0920      PT SHORT TERM GOAL #1   Title Pt will be I and compliant with HEP.    Time 4    Period Weeks  Status Achieved    Target Date 10/13/20      PT SHORT TERM GOAL #2   Title Pt will reduce overall back pain >25%    Baseline more numbness than pain now    Time 4    Period Weeks    Status Achieved             PT Long Term Goals - 04/07/21 0945      PT LONG TERM GOAL #1   Title Pt will reduce overall pain >50%    Baseline patient believe this goal has been met with current pain management    Time 12    Period Weeks    Status Achieved      PT LONG TERM GOAL #2   Title Patient will demonstrate independent use of home exercise program progression to facilitate ability to maintain/progress functional gains from skilled physical therapy services.    Baseline progressed today    Time 12    Period Weeks    Status Achieved      PT LONG TERM GOAL #3   Title Pt will improve lumbar ROM to [facility]    Baseline now met    Time 12    Period Weeks    Status Achieved      PT LONG TERM GOAL #4   Title Pt will be able to ambulate community distances without AD and return to normal ADLS    Baseline can amblate community distances now without AD but still not back to all his normal ADLs    Time 12    Period Weeks    Status On-going      PT LONG TERM GOAL #5   Title Patient will  demonstrate Ltt LE MMT 4+ throughout    Baseline still with Lt ankle weakness 3+ to 4    Time 12    Period Weeks    Status On-going      PT LONG TERM GOAL #6   Title He will be able to hold SLS on Lt leg for 10 sec to show improved balance    Baseline now about 6 secoonds on Lt    Time 4    Period Weeks    Status On-going                 Plan - 05/20/21 0958    Clinical Impression Statement One remaining visit left and then he will transition to [facility] so we will finalize his HEP and gym program next visit.    Examination-Activity Limitations Bend;Dressing;Lift;Stand;Stairs;Squat;Sleep;Locomotion Level    Examination-Participation Restrictions Cleaning;Driving;Laundry;Yard Work;Shop    Stability/Clinical Decision Making Evolving/Moderate complexity    Rehab Potential Good    PT Frequency 2x / week    PT Duration 12 weeks   6-12 weeks   PT Treatment/Interventions ADLs/Self Care Home Management;Cryotherapy;Barrister's clerk;Therapeutic activities;Therapeutic exercise;Neuromuscular re-education;Manual techniques;Passive range of motion;Taping;Spinal Manipulations;Joint Manipulations;Dry needling;Balance training    PT Next Visit Plan work to independent program over next visit    PT Home Exercise Plan Access Code: [email or web address], added ankle 4 way, ankle alphabet and TKE 3 way    Consulted and Agree with Plan of Care Patient           Patient will benefit from skilled therapeutic intervention in order to improve the following deficits and impairments:  Abnormal gait,Decreased activity tolerance,Decreased balance,Decreased endurance,Decreased coordination,Decreased mobility,Decreased range of motion,Decreased strength,Difficulty walking,Increased muscle spasms,Pain,Improper body mechanics  Visit Diagnosis: Bilateral low back pain with left-sided sciatica,  unspecified chronicity  Localized edema  Muscle weakness  (generalized)  Other abnormalities of gait and mobility     Problem List Patient Active Problem List   Diagnosis Date Noted  . ED (erectile dysfunction) 08/09/2012  . Hypercholesterolemia   . Chronic rhinitis   . Hyperlipidemia 02/29/2012    [name] 05/20/2021, 9:59 AM  [facility] Physical Therapy [address] [place], [place], [address] Phone: [phone]   Fax:  [phone]  Name: Jonathan Bridges MRN: [id number] Date of Birth: 11-19-1946

## 2021-05-25 ENCOUNTER — Ambulatory Visit: Payer: Medicare PPO | Admitting: Physical Therapy

## 2021-05-25 ENCOUNTER — Other Ambulatory Visit: Payer: Self-pay

## 2021-05-25 DIAGNOSIS — R2689 Other abnormalities of gait and mobility: Secondary | ICD-10-CM

## 2021-05-25 DIAGNOSIS — M5442 Lumbago with sciatica, left side: Secondary | ICD-10-CM | POA: Diagnosis not present

## 2021-05-25 DIAGNOSIS — R600 Localized edema: Secondary | ICD-10-CM | POA: Diagnosis not present

## 2021-05-25 DIAGNOSIS — M6281 Muscle weakness (generalized): Secondary | ICD-10-CM | POA: Diagnosis not present

## 2021-05-25 NOTE — Therapy (Signed)
[facility] Physical Therapy [address] [place], [place], [address] Phone: [phone]   Fax:  [phone]  Physical Therapy Treatment/Discharge summary PHYSICAL THERAPY DISCHARGE SUMMARY  Visits from Start of Care: 60  Current functional level related to goals / functional outcomes: See below   Remaining deficits: See below   Education / Equipment: HEP Plan: Patient agrees to discharge.  Patient goals were almost all met Patient is being discharged due to meeting the stated rehab goals and feels confident he can continue to progress on his own at home.     Patient Details  Name: Jonathan Bridges MRN: [id number] Date of Birth: 1946/07/27 Referring Provider (PT): Judith Part, MD   Encounter Date: 05/25/2021   PT End of Session - 05/25/21 0851     Visit Number 63    Number of Visits 60    Authorization Type 12 more visits approaved until 6/20    Authorization - Visit Number 12    Authorization - Number of Visits 12    Progress Note Due on Visit 26    PT Start Time 0800    PT Stop Time 0843    PT Time Calculation (min) 43 min    Activity Tolerance Patient tolerated treatment well    Behavior During Therapy [facility] for tasks assessed/performed             Past Medical History:  Diagnosis Date   Anxiety    Basal cell carcinoma    Chronic rhinitis    Clotting disorder ([place])    right leg    Depression    Diverticulosis    Elevated LFTs    History of hypertension    Hypercholesterolemia    Sleep apnea     Past Surgical History:  Procedure Laterality Date   COLONOSCOPY     WRIST SURGERY  01/24/2003   Right- ORIF: Dr. Amedeo Plenty    There were no vitals filed for this visit.   Subjective Assessment - 05/25/21 0850     Subjective He relays he is ready for discharge today.    Pertinent History lumbar surgery 07/09/20    Pain Onset More than a month ago                [facility] PT Assessment - 05/25/21 0001       Assessment    Medical Diagnosis lumbar radiculopathy, post op disc herniation operation    Referring Provider (PT) Judith Part, MD      Single Leg Stance   Comments 16 second max, times of 11, 16, 15 sec trials      AROM   Overall AROM Comments lumbar ROM WNL      Strength   Right Knee Flexion 5/5    Right Knee Extension 5/5    Left Knee Flexion 5/5    Left Knee Extension 5/5    Left Ankle Dorsiflexion 5/5    Left Ankle Plantar Flexion 4/5   2/5 in standing   Left Ankle Inversion 4+/5    Left Ankle Eversion 4+/5                           [facility] Adult PT Treatment/Exercise - 05/25/21 0001       Neuro Re-ed    Neuro Re-ed Details  SLS 3 times max time, tandem walk 3 round trips      Lumbar Exercises: Stretches   Gastroc Stretch Limitations 2 reps gastroc, 2 reps  soleus on slantboard 30 sec X3      Lumbar Exercises: Aerobic   Recumbent Bike 10 min L4      Lumbar Exercises: Standing   Heel Raises Limitations eccentric heel raises X15, then heel and toe raises 20 reps holding 3 sec    Other Standing Lumbar Exercises step ups with alt leg march on 8 inch step 2X10 for both      Lumbar Exercises: Seated   Sit to Stand Limitations SL from 25.5 inch. 2X10 on both      Ankle Exercises: Supine   T-Band LLE for L5 and L3 together for PF 2X20, L3 for DF/INV/EV X 20                         PT Long Term Goals - 05/25/21 0854       PT LONG TERM GOAL #1   Title Pt will reduce overall pain >50%    Baseline patient believe this goal has been met with current pain management    Time 12    Period Weeks    Status Achieved      PT LONG TERM GOAL #2   Title Patient will demonstrate independent use of home exercise program progression to facilitate ability to maintain/progress functional gains from skilled physical therapy services.    Baseline progressed today    Time 12    Period Weeks    Status Achieved      PT LONG TERM GOAL #3   Title Pt will improve  lumbar ROM to [facility]    Baseline now met    Time 12    Period Weeks    Status Achieved      PT LONG TERM GOAL #4   Title Pt will be able to ambulate community distances without AD and return to normal ADLS    Baseline can amblate community distances now without AD and perform most of ususal activity    Time 12    Period Weeks    Status Achieved      PT LONG TERM GOAL #5   Title [name] 4+ throughout    Baseline met in sitting but still has PF weakness in standing    Time 12    Period Weeks    Status Partially Met      PT LONG TERM GOAL #6   Title He will be able to hold SLS on Lt leg for 10 sec to show improved balance    Baseline met today 12 sec avg    Time 4    Period Weeks    Status Achieved                   Plan - 05/25/21 9833     Clinical Impression Statement He has made overall good progress with PT. He will discharge today to HEP which was condensed with priority exercises and this was reviewed with him and he shows good understanding. He does still have mild deficits in left ankle strength, eccentric quad control, and overall single leg balance and hopefully these will continue to improve with more time and work at home. He had no further questions or concerns with being discharged today.    Examination-Activity Limitations Bend;Dressing;Lift;Stand;Stairs;Squat;Sleep;Locomotion Level    Examination-Participation Restrictions Cleaning;Driving;Laundry;Yard Work;Shop    Stability/Clinical Decision Making Evolving/Moderate complexity    Rehab Potential Good    PT Frequency 2x / week  PT Duration 12 weeks   6-12 weeks   PT Treatment/Interventions ADLs/Self Care Home Management;Cryotherapy;Barrister's clerk;Therapeutic activities;Therapeutic exercise;Neuromuscular re-education;Manual techniques;Passive range of motion;Taping;Spinal Manipulations;Joint Manipulations;Dry needling;Balance  training    PT Next Visit Plan work to independent program over next visit    PT Home Exercise Plan eccentric PF heel raises, tandem walk,  ankle 4 way, TKE 3 way, eccentric sit to stands    Consulted and Agree with Plan of Care Patient             Patient will benefit from skilled therapeutic intervention in order to improve the following deficits and impairments:  Abnormal gait, Decreased activity tolerance, Decreased balance, Decreased endurance, Decreased coordination, Decreased mobility, Decreased range of motion, Decreased strength, Difficulty walking, Increased muscle spasms, Pain, Improper body mechanics  Visit Diagnosis: Bilateral low back pain with left-sided sciatica, unspecified chronicity  Localized edema  Muscle weakness (generalized)  Other abnormalities of gait and mobility     Problem List Patient Active Problem List   Diagnosis Date Noted   ED (erectile dysfunction) 08/09/2012   Hypercholesterolemia    Chronic rhinitis    Hyperlipidemia 02/29/2012    [name] 05/25/2021, 8:56 AM  [facility] Physical Therapy [address] [place], [place], [address] Phone: [phone]   Fax:  [phone]  Name: DORR PERROT MRN: [id number] Date of Birth: [birth year]

## 2021-05-27 ENCOUNTER — Encounter: Payer: Medicare PPO | Admitting: Physical Therapy

## 2021-06-01 ENCOUNTER — Encounter: Payer: Medicare PPO | Admitting: Physical Therapy

## 2021-06-03 ENCOUNTER — Encounter: Payer: Medicare PPO | Admitting: Physical Therapy

## 2021-09-03 IMAGING — DX DG HIP (WITH OR WITHOUT PELVIS) 2-3V*L*
3 series · 3 of 3 positions shown · non-contrast
Comparison: None.

CLINICAL DATA: Left hip pain.  No injury.

EXAM:
DG HIP (WITH OR WITHOUT PELVIS) 2-3V LEFT

[pelvis ap]
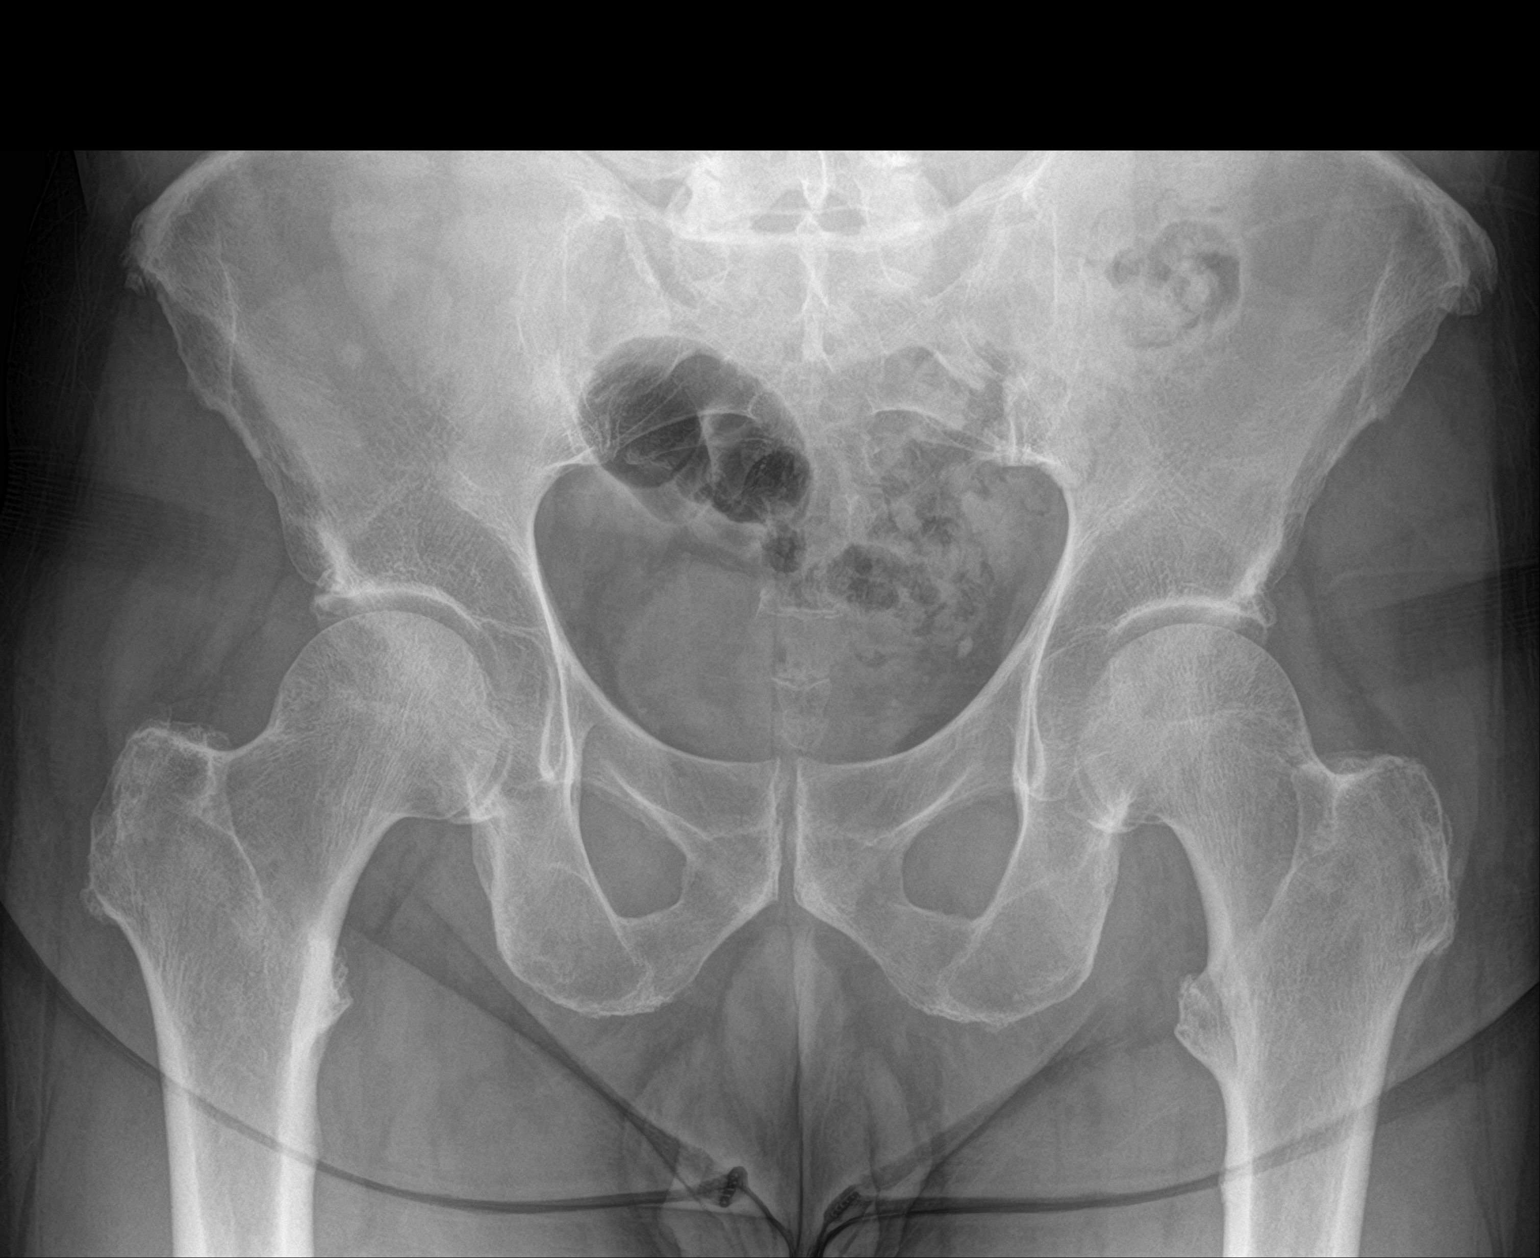

[hip ap]
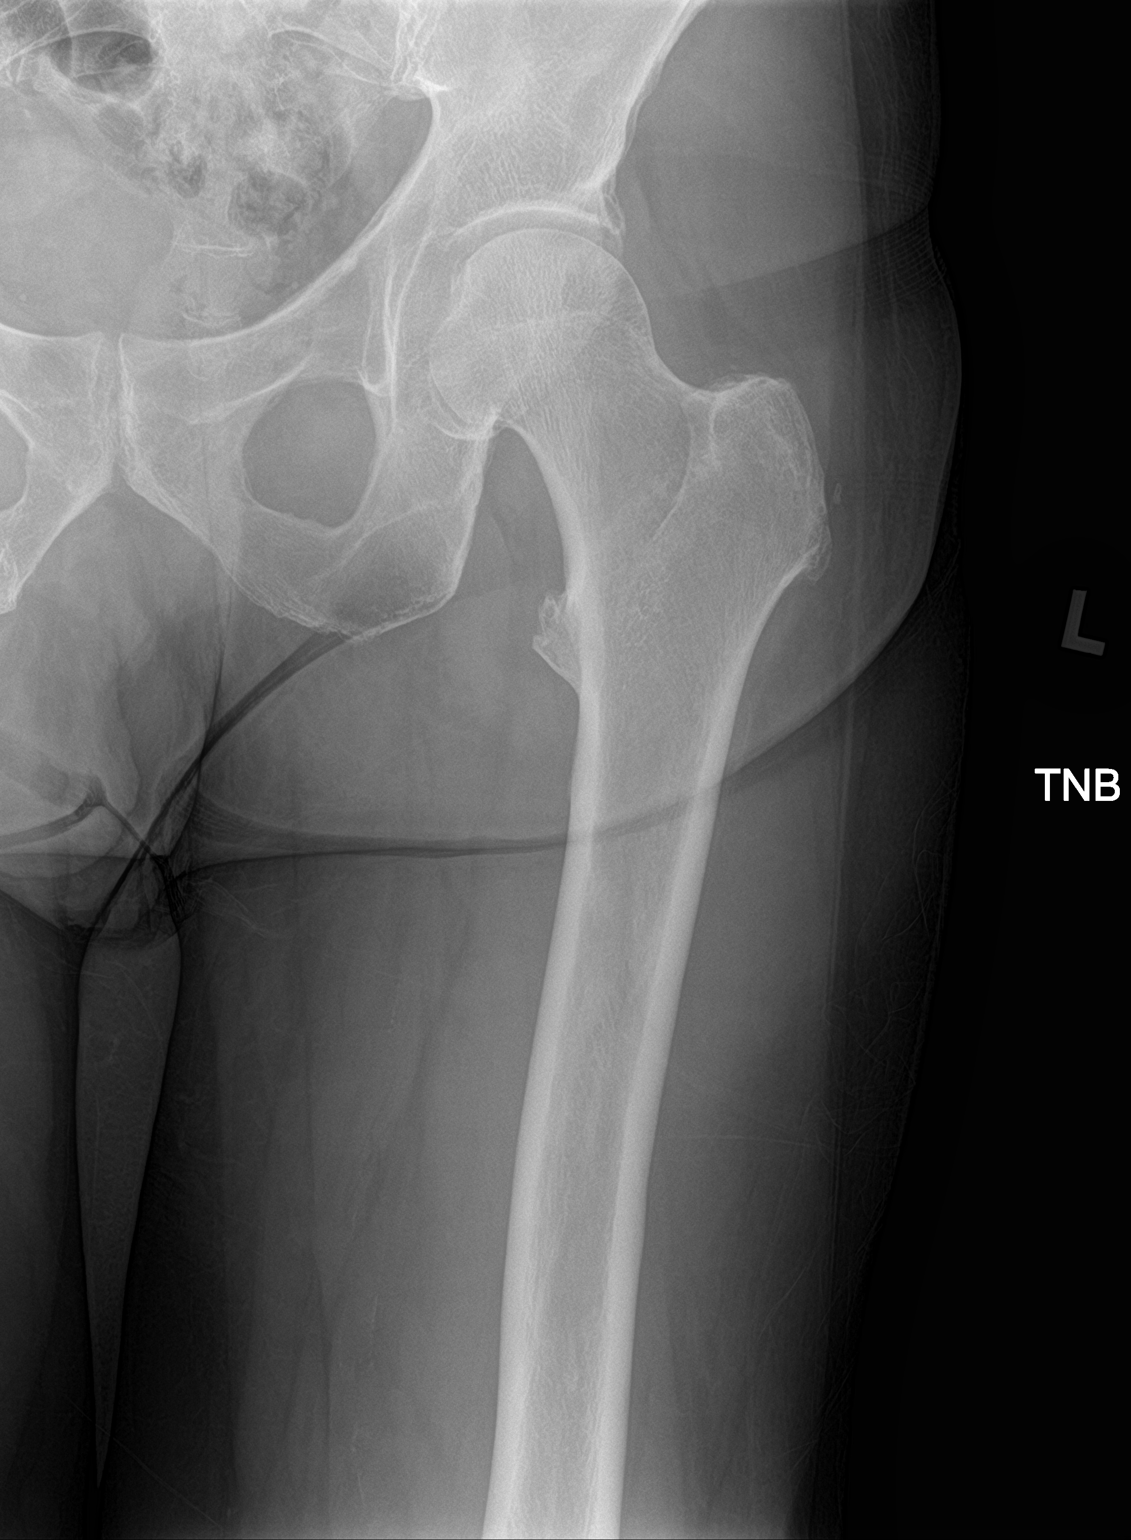

[hip lat]
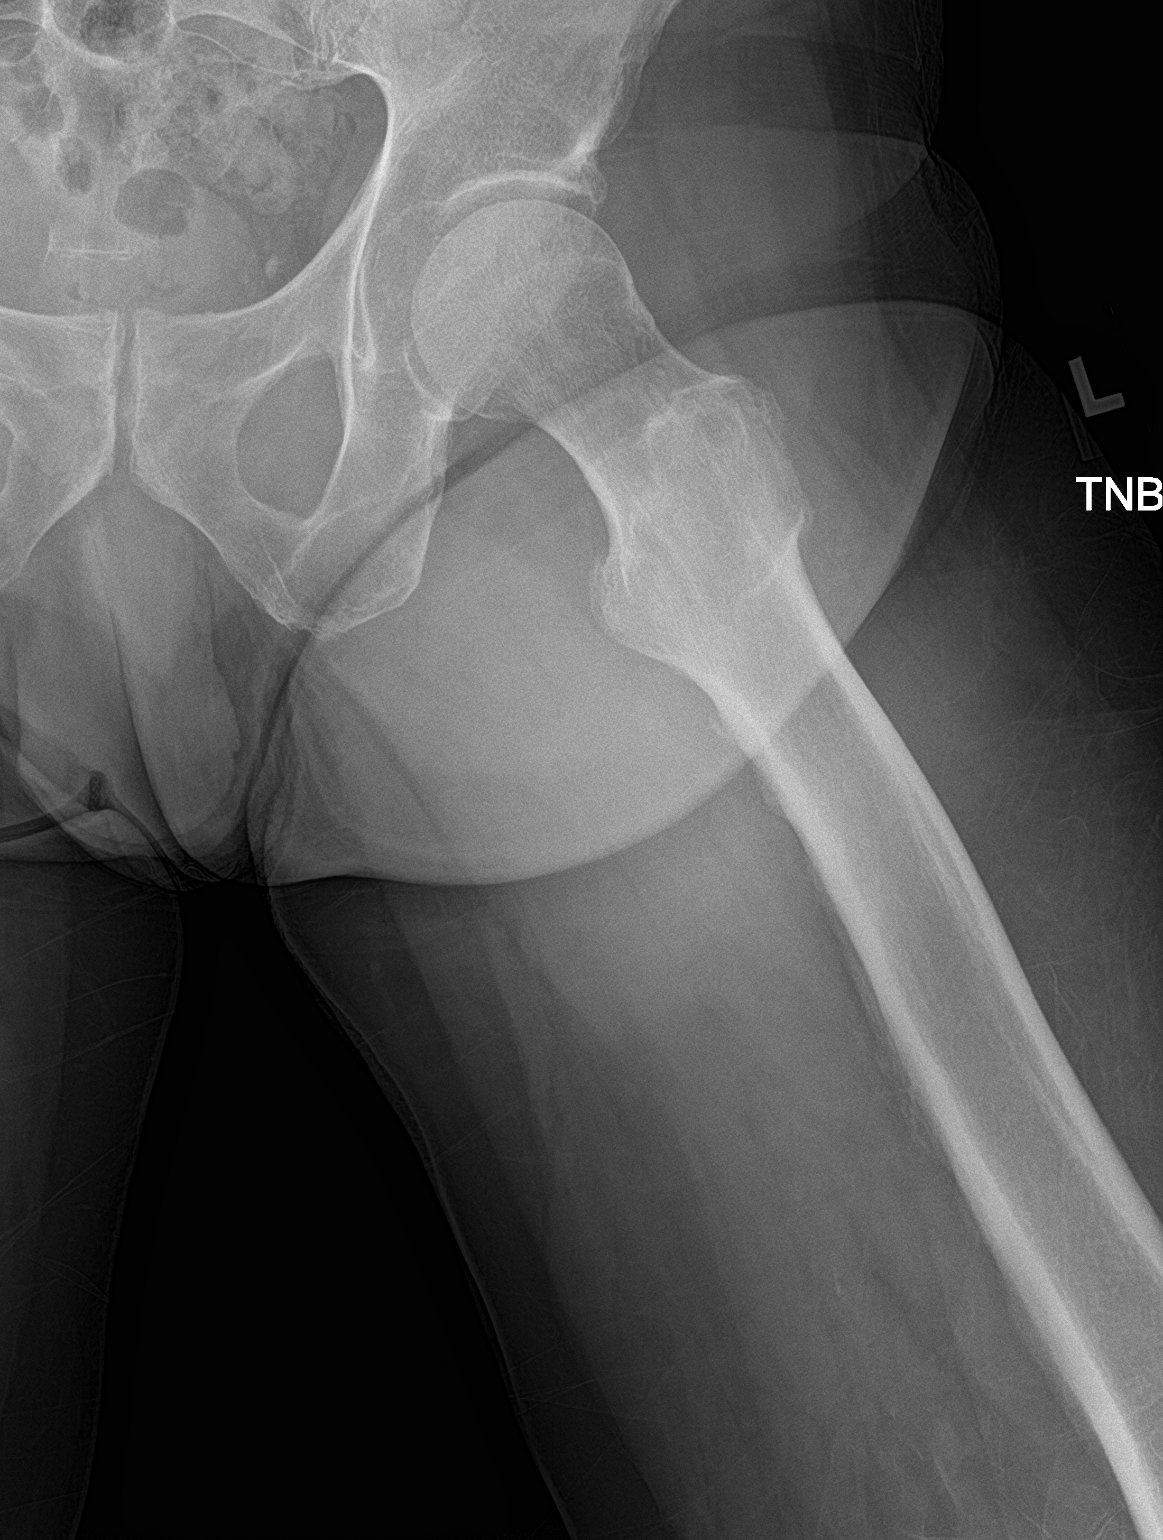

[3 of 3 positions shown; findings below may reference images not displayed]

FINDINGS: Pelvic bony ring is intact. Left hip is located without significant
joint space narrowing. No gross abnormality to the right hip on the
pelvic view. Evidence for disc disease at L4-L5.
IMPRESSION: No acute abnormality to the pelvis or left hip.

## 2021-10-14 IMAGING — MR MR LUMBAR SPINE W/O CM
4 of 5 series · 18 of 48 positions shown · non-contrast
Comparison: 06/18/2020 lumbar spine radiographs

CLINICAL DATA: Low back pain for greater than 6 weeks. Left lower
extremity radiculopathy

EXAM:
MRI LUMBAR SPINE WITHOUT CONTRAST
TECHNIQUE: Multiplanar, multisequence MR imaging of the lumbar spine was
performed. No intravenous contrast was administered.

[Series 5: T2 · sagittal · 4.0mm · 0.73mm/px · 6 of 15 slices shown (1 of 2)]
[im 1/15]
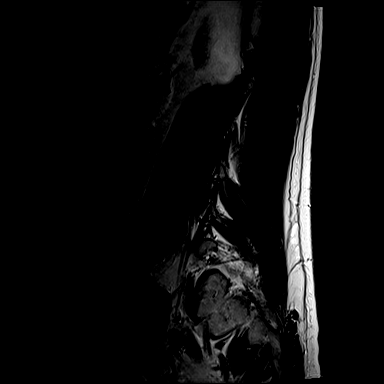
[im 3/15]
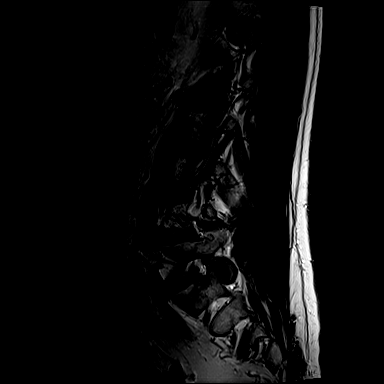
[im 6/15]
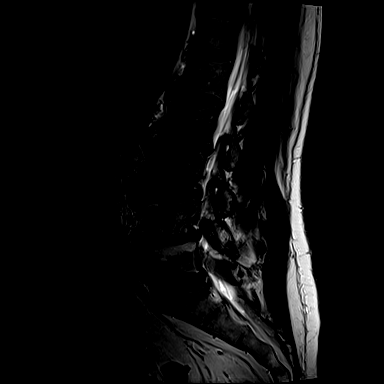
[im 9/15]
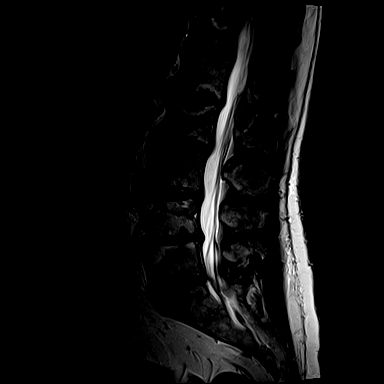
[im 12/15]
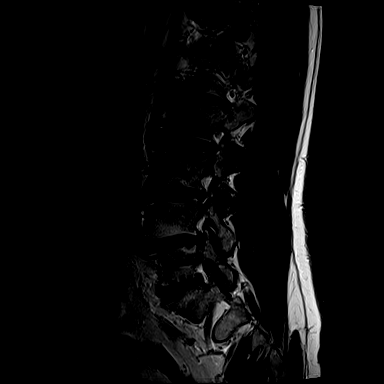
[im 15/15]
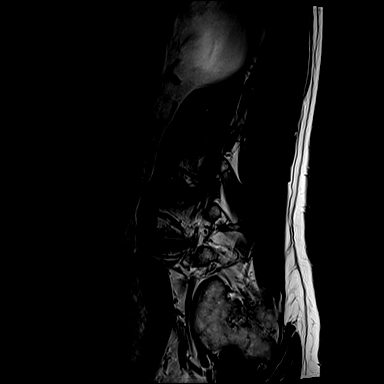

[Series 6: T1 · sagittal · 4.0mm · 0.73mm/px · 3 of 15 slices shown (1 of 2)]
[im 3/15]
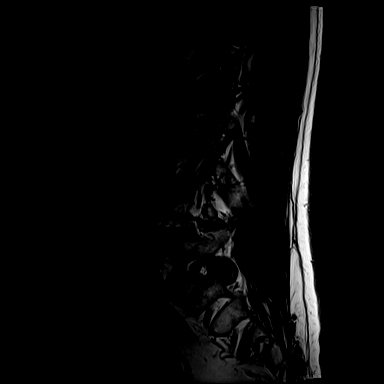
[im 9/15]
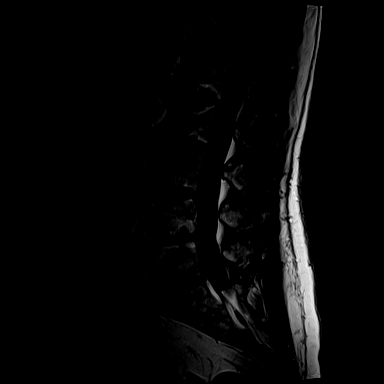
[im 15/15]
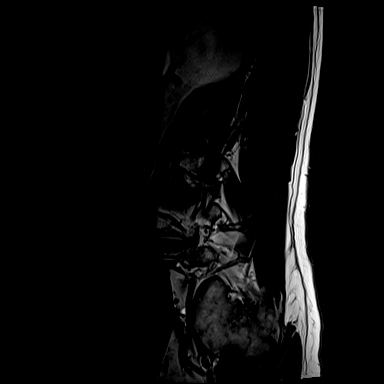

[Series 10: T1 · axial · 4.0mm · 0.28mm/px · z∈[-64,+102]mm · 3 of 41 slices shown (2 of 2)]
[im 6/41]
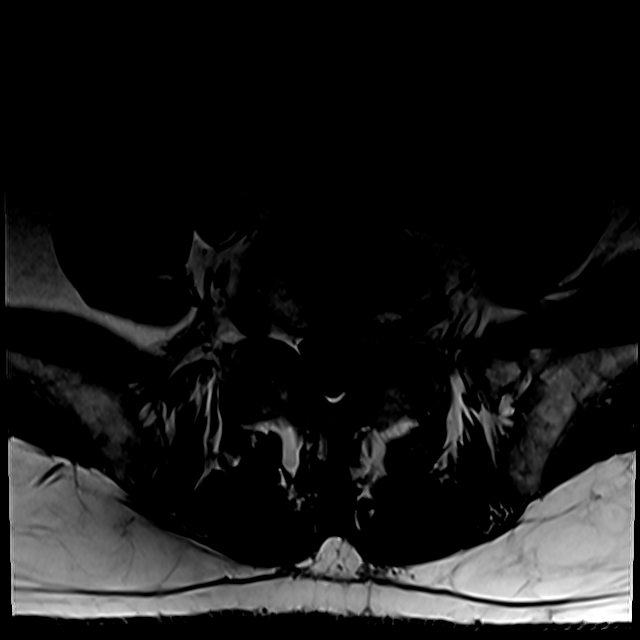
[im 21/41]
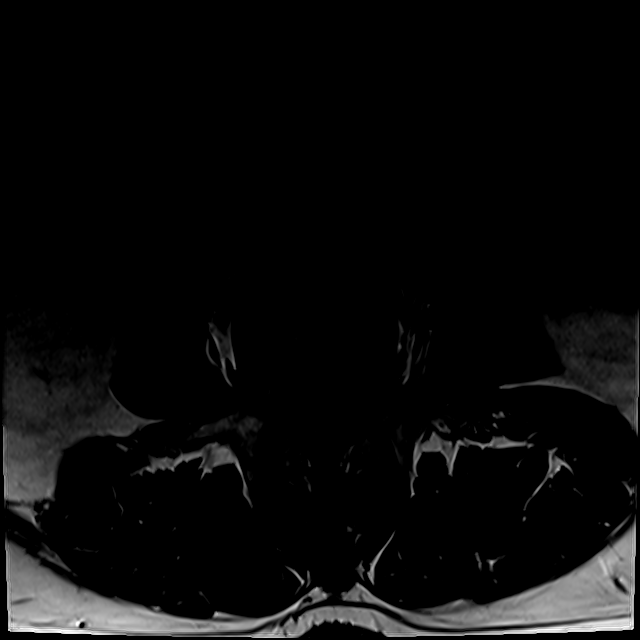
[im 35/41]
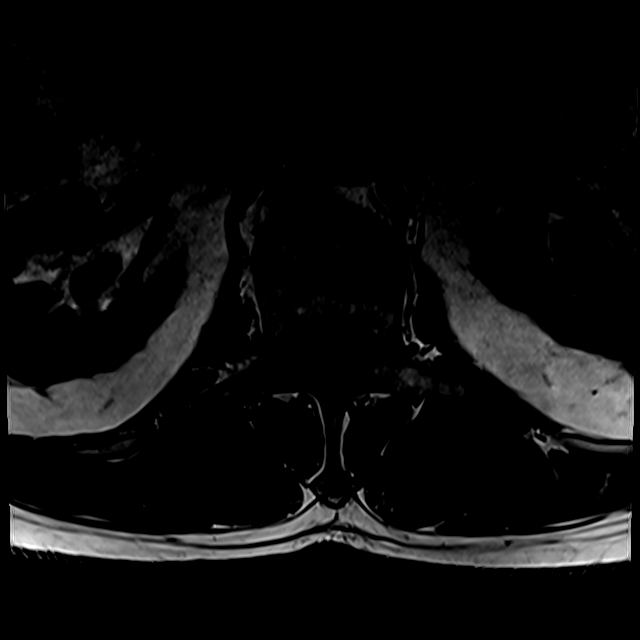

[Series 13: T2 · axial · 4.0mm · 0.28mm/px · z∈[-89,+102]mm · 6 of 41 slices shown (2 of 2)]
[im 1/41]
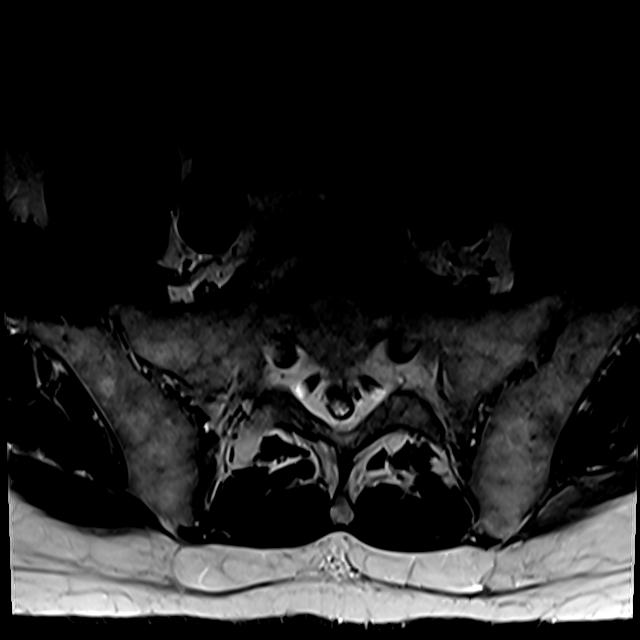
[im 6/41]
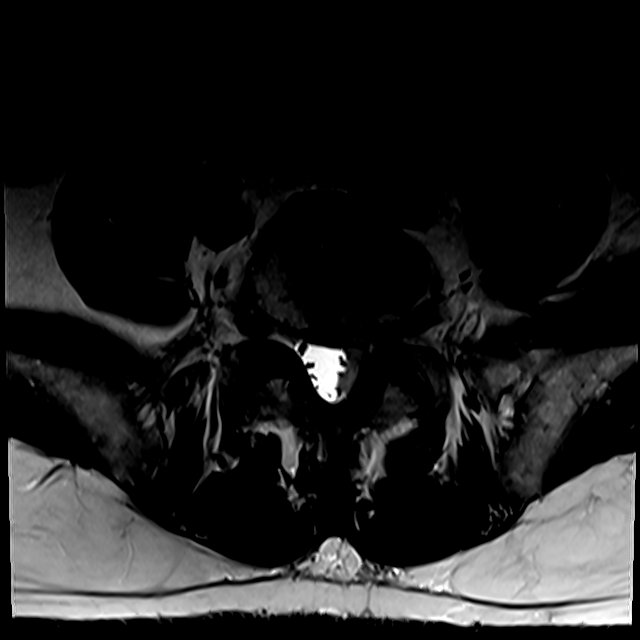
[im 12/41]
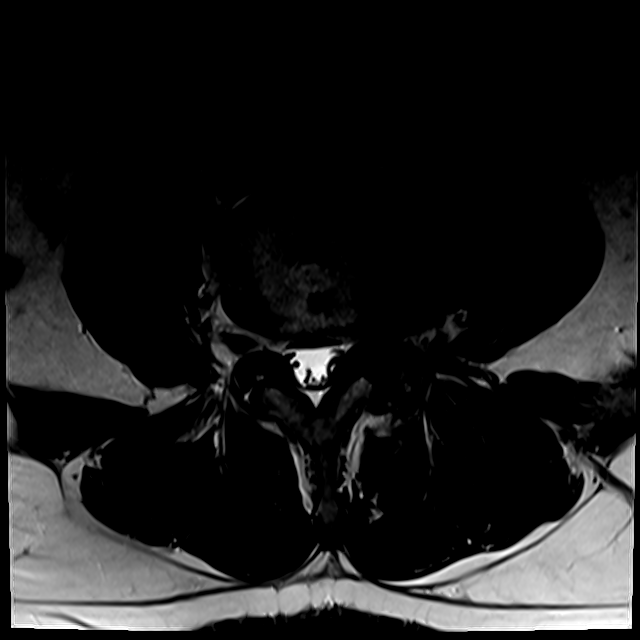
[im 18/41]
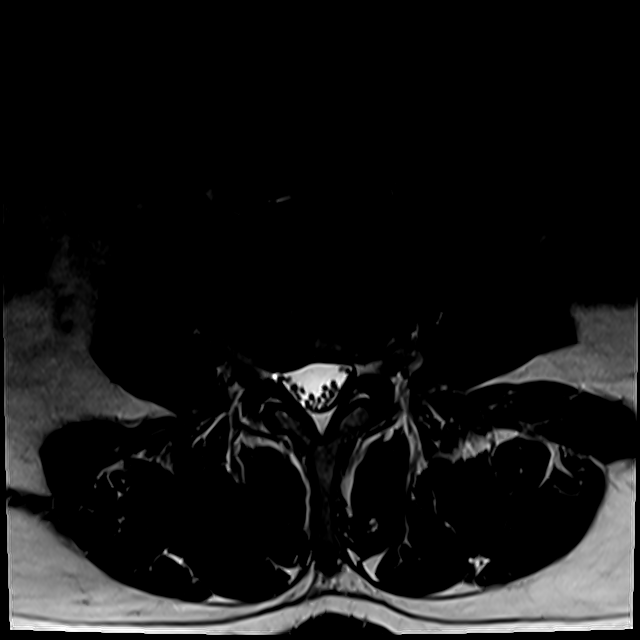
[im 21/41]
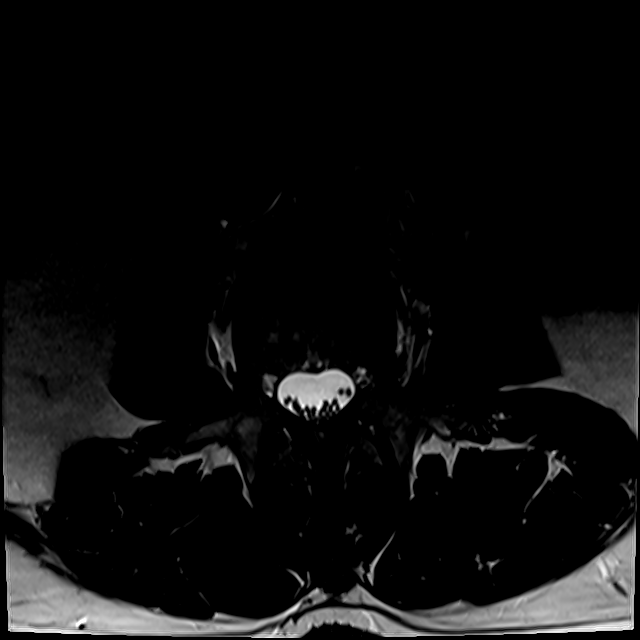
[im 35/41]
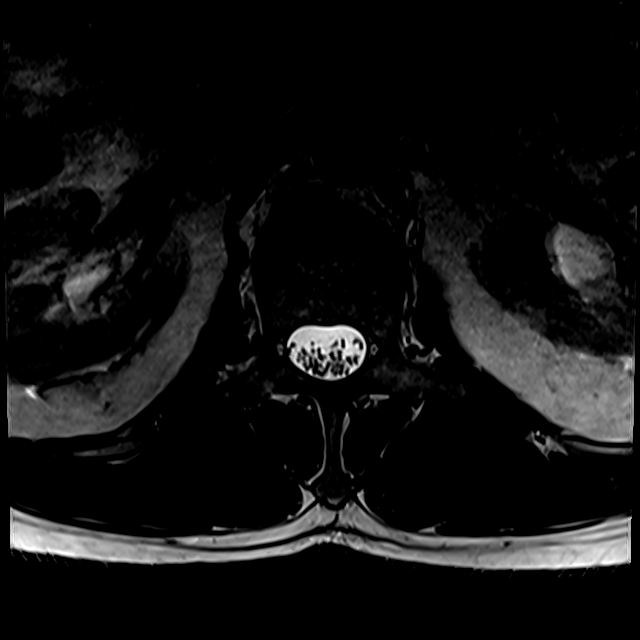

[18 of 48 positions shown; findings below may reference images not displayed]

FINDINGS: Segmentation: The lowest lumbar type non-rib-bearing vertebra is
labeled as L5.

Alignment: 2-3 mm of degenerative retrolisthesis at L1-2, L2-3, and
L3-4.

Vertebrae: Multilevel Schmorl's nodes particularly at T11-12, L1-2,
L3-4, and L4-5. Type 2 degenerative endplate findings at each of
these levels, especially L4-5. Disc desiccation is present at
T12-L1, L1-2, L2-3, and L4-5 with loss of disc height especially at
L4-5. Facet arthropathy with accentuated T1 and T2 signal in the
left L4-5 facets. Suspected partial fusion of the upper portion of
the sacroiliac joints.

Conus medullaris and cauda equina: Conus extends to the L1 level.
Conus and cauda equina appear normal.

Paraspinal and other soft tissues: Suspected bilateral peripelvic
cysts.

Disc levels:

T12-L1: Unremarkable.

L1-2: Mild displacement of the right L1 nerve in the lateral
extraforaminal space due to right lateral extraforaminal disc
protrusion. Mild disc bulge.

L2-3: Moderate right subarticular lateral recess stenosis noted with
mild displacement of both L2 nerves in the lateral extraforaminal
space is due to right lateral recess and inferior foraminal disc
protrusion along with disc bulge.

L3-4: Mild right subarticular lateral recess stenosis due to right
lateral recess disc protrusion. Mild disc bulge and intervertebral
spurring noted.

L4-5: Moderate left and mild right foraminal stenosis with mild
bilateral subarticular lateral recess stenosis mild central
narrowing of the thecal sac due to disc bulge, intervertebral
spurring, and facet arthropathy.

L5-S1: Moderate to prominent left foraminal stenosis with moderate
left and mild right subarticular lateral recess stenosis and
borderline right foraminal stenosis due to disc bulge,
intervertebral spurring, facet spurring, and a left lateral
extradural intermediate signal intensity lesion measuring 1.5 by
by 1.6 cm extending in the left lateral recess and medial to the
left facet joint. Possibilities for this process include disc
fragment, synovial cyst, or blood products. Overall disc fragment is
favored. This process is shown on images 35-36 of series 13.
IMPRESSION: 1. Lumbar spondylosis and degenerative disc disease, causing
moderate to prominent impingement at L5-S1; moderate impingement at
L2-3 and L4-5; and mild impingement at L1-2 and L3-4.
2. 1.5 by 0.4 by 1.6 cm left lateral extradural intermediate signal
intensity lesion extending in the left lateral recess and medial to
the left facet joint at L5-S1. Possibilities for this process
include disc fragment, synovial cyst, or blood products. Overall
disc fragment is favored.
